# Patient Record
Sex: Female | Born: 1945 | Race: White | Hispanic: No | Marital: Married | Smoking: Former smoker
Health system: Southern US, Community
[De-identification: ages and names within clinical notes are randomized; demographics above are authoritative.]

## PROBLEM LIST (undated history)

## (undated) DIAGNOSIS — R569 Unspecified convulsions: Secondary | ICD-10-CM

## (undated) DIAGNOSIS — I10 Essential (primary) hypertension: Secondary | ICD-10-CM

## (undated) DIAGNOSIS — M8580 Other specified disorders of bone density and structure, unspecified site: Secondary | ICD-10-CM

## (undated) DIAGNOSIS — C801 Malignant (primary) neoplasm, unspecified: Secondary | ICD-10-CM

## (undated) DIAGNOSIS — Z5189 Encounter for other specified aftercare: Secondary | ICD-10-CM

## (undated) HISTORY — DX: Other specified disorders of bone density and structure, unspecified site: M85.80

## (undated) HISTORY — DX: Malignant (primary) neoplasm, unspecified: C80.1

## (undated) HISTORY — PX: CHOLECYSTECTOMY: SHX55

## (undated) HISTORY — PX: VAGINAL HYSTERECTOMY: SHX2639

## (undated) HISTORY — PX: HERNIA REPAIR: SHX51

## (undated) HISTORY — DX: Unspecified convulsions: R56.9

## (undated) HISTORY — PX: MOHS SURGERY: SUR867

## (undated) HISTORY — DX: Essential (primary) hypertension: I10

## (undated) HISTORY — DX: Encounter for other specified aftercare: Z51.89

---

## 2003-04-20 HISTORY — PX: OOPHORECTOMY: SHX86

## 2009-10-16 ENCOUNTER — Ambulatory Visit: Payer: Self-pay | Admitting: Internal Medicine

## 2009-10-16 DIAGNOSIS — J45991 Cough variant asthma: Secondary | ICD-10-CM

## 2009-10-16 DIAGNOSIS — C4490 Unspecified malignant neoplasm of skin, unspecified: Secondary | ICD-10-CM

## 2009-10-16 DIAGNOSIS — G40309 Generalized idiopathic epilepsy and epileptic syndromes, not intractable, without status epilepticus: Secondary | ICD-10-CM | POA: Insufficient documentation

## 2009-10-16 DIAGNOSIS — R05 Cough: Secondary | ICD-10-CM

## 2009-11-10 ENCOUNTER — Ambulatory Visit: Payer: Self-pay | Admitting: Internal Medicine

## 2009-11-14 ENCOUNTER — Telehealth (INDEPENDENT_AMBULATORY_CARE_PROVIDER_SITE_OTHER): Payer: Self-pay | Admitting: *Deleted

## 2009-12-03 ENCOUNTER — Telehealth (INDEPENDENT_AMBULATORY_CARE_PROVIDER_SITE_OTHER): Payer: Self-pay | Admitting: *Deleted

## 2009-12-24 ENCOUNTER — Ambulatory Visit: Payer: Self-pay | Admitting: Internal Medicine

## 2010-01-20 ENCOUNTER — Telehealth: Payer: Self-pay | Admitting: Internal Medicine

## 2010-01-27 ENCOUNTER — Telehealth (INDEPENDENT_AMBULATORY_CARE_PROVIDER_SITE_OTHER): Payer: Self-pay | Admitting: *Deleted

## 2010-03-26 ENCOUNTER — Ambulatory Visit: Payer: Self-pay | Admitting: Internal Medicine

## 2010-03-26 DIAGNOSIS — M810 Age-related osteoporosis without current pathological fracture: Secondary | ICD-10-CM | POA: Insufficient documentation

## 2010-05-19 NOTE — Progress Notes (Signed)
Summary: neb meds not at pharmacy  Phone Note Call from Patient   Caller: Patient Call For: wert Summary of Call: pharmacy have not received prescript for her med :albuterol inhaler  [facility] [phone] Initial call taken by: Rickard Patience,  January 27, 2010 2:11 PM  Follow-up for Phone Call        per EMR, we've already sent this electronically on 01/20/2010.  Called and spoke with [employer] [employer] and informed them of the above info.  Was told that they were having computer problems last week and did not show that they received the rx for Albuterol/Ipratrop and Budesonide that we sent on 01/20/2010.  Therefore gave a verbal order for these rx.  Called and spoke with pt. and informed her of the above information and that rx were called into pharmacy.  nothing further needed.  Arman Filter LPN  January 27, 2010 4:37 PM

## 2010-05-19 NOTE — Progress Notes (Signed)
Summary: duoneb and budesonide refills  Phone Note Call from Patient   Caller: Patient Call For: Allison Gross Summary of Call: need refills for duo neb solution and budesonide [facility] [place] [phone] Initial call taken by: Rickard Patience,  January 20, 2010 2:12 PM  Follow-up for Phone Call        called spoke with patient who requests a refill on her duoneb and budesonide nebs; this has been filled previously by another physician.  Dr. Sherene Sires, may we begin filling this for pt?  *note: the pharmacy in pt's chart has been bought out by [facility].  Follow-up by: Boone Master CNA/MA,  January 20, 2010 2:47 PM  Additional Follow-up for Phone Call Additional follow up Details #1::        ok to refill Additional Follow-up by: Nyoka Cowden MD,  January 20, 2010 6:07 PM    Additional Follow-up for Phone Call Additional follow up Details #2::    refill sent. pt aware. Carron Curie CMA  January 21, 2010 9:04 AM   Prescriptions: IPRATROPIUM-ALBUTEROL 0.5-2.5 (3) MG/3ML SOLN (IPRATROPIUM-ALBUTEROL) 1 every 6 hours in nebulizer if needed  #120 x 3   Entered by:   Carron Curie CMA   Authorized by:   Nyoka Cowden MD   Signed by:   Carron Curie CMA on 01/21/2010   Method used:   Electronically to        [facility] (retail)       [address]       [place], [place]  [address]       Ph: [phone]       Fax:    RxID:   [id number] BUDESONIDE 0.5 MG/2ML SUSP (BUDESONIDE) 1 in nebulizer two times a day as needed  #60 x 3   Entered by:   Carron Curie CMA   Authorized by:   Nyoka Cowden MD   Signed by:   Carron Curie CMA on 01/21/2010   Method used:   Electronically to        [facility]* (retail)       [address]       [place], [place]  [address]       Ph: [phone]       Fax:    RxID:   [id number]

## 2010-05-19 NOTE — Assessment & Plan Note (Signed)
Summary: Pulmonary/ ext f/u ov   Copy to:  self Primary Provider/Referring Provider:  none  CC:  4 wk followup.  Pt states that her breathing is the same- has not had any cough since last seen except for 1 day when the weather was humid.  She states had a sore throat a few days after starting symbicort but this has resolved.  Allison Gross  History of Present Illness: 65 yowf quit light smoking 1973 with intermittent sinus/ bronchitis but no  chronic c/o's or need for rx not much better after stopped smoking  then much worse after around 2000 despite daily maint rx.  Previously found to be allergic to dust, cats and trees and mold but no seasonal pattern.  October 16, 2009  1st pulmonary office eval on a typical good day used advair , singulair nasonex and allegra, astepro back up plan to add duoneb and budesonide but reasons she's here =  doesn't seem to help much. last exac May and still required prednisone x sev weeks.  Still left with sensation of pnds but no noct cough or excess mucus production.  November 10, 2009 cc her breathing is the same- has not had any cough since last seen except for 1 day when the weather was humid.  She states had a sore throat a few days after starting symbicort but this has resolved.  overall happy with rx.  Pt denies any significant sore throat, dysphagia, itching, sneezing,  nasal congestion or excess secretions,  fever, chills, sweats, unintended wt loss, pleuritic or exertional cp, hempoptysis, change in activity tolerance  orthopnea pnd or leg swelling. still sense of daytime need to clear throat worse with voice use, no noct spells or need for rescue  Current Medications (verified): 1)  Tegretol Xr 200 Mg Xr12h-Tab (Carbamazepine) .Allison Gross.. 1 Two Times A Day 2)  Singulair 10 Mg Tabs (Montelukast Sodium) .Allison Gross.. 1 Once Daily 3)  Nasonex 50 Mcg/act Susp (Mometasone Furoate) .... 2 Sprays Each Nostril Once Daily 4)  Sinus Rinse  Pack (Hypertonic Nasal Wash) .Allison Gross.. 1 Once Daily 5)   Allegra 180 Mg Tabs (Fexofenadine Hcl) .Allison Gross.. 1 Once Daily 6)  Astepro 0.15 % Soln (Azelastine Hcl) .... 2 Sprays Each Nostril Daily 7)  Calcium 1200 Mg Plus Vit D .... 1 Once Daily 8)  Vitamin B-12 1000 Mcg Tabs (Cyanocobalamin) .Allison Gross.. 1 Once Daily 9)  Vitamin C 500 Mg Tabs (Ascorbic Acid) .Allison Gross.. 1 Once Daily 10)  Multivitamins  Tabs (Multiple Vitamin) .Allison Gross.. 1 Once Daily 11)  Symbicort 160-4.5 Mcg/act  Aero (Budesonide-Formoterol Fumarate) .... 2 Puffs First Thing  in Am and 2 Puffs Again in Pm About 12 Hours Later 12)  Ipratropium-Albuterol 0.5-2.5 (3) Mg/36ml Soln (Ipratropium-Albuterol) .Allison Gross.. 1 Every 6 Hours in Nebulizer If Needed 13)  Budesonide 0.5 Mg/66ml Susp (Budesonide) .Allison Gross.. 1 in Nebulizer Two Times A Day As Needed 14)  Mucinex Maximum Strength 1200 Mg Xr12h-Tab (Guaifenesin) .... 2 Two Times A Day As Needed 15)  Ipratropium Bromide 0.03 % Soln (Ipratropium Bromide) .Allison Gross.. 1 Spray Each Nostril As Needed 16)  Proair Hfa 108 (90 Base) Mcg/act Aers (Albuterol Sulfate) .... 2 Puffs Every 4-6 Hours As Needed  Allergies (verified): No Known Drug Allergies  Past History:  Past Medical History: Asthma     - HFA 50% November 10, 2009 Osteopenia     - hold fosfamax 09/2009  Vital Signs:  Patient profile:   65 year old female Weight:      153 pounds O2 Sat:  99 % on Room air Temp:     98.4 degrees F oral Pulse rate:   69 / minute BP sitting:   140 / 96  (left arm)  Vitals Entered By: Vernie Murders (November 10, 2009 11:13 AM)  O2 Flow:  Room air  Physical Exam  Additional Exam:  obese slt hoarse wf occ throat clearing   wt 153 > 153 November 10, 2009  HEENT: nl dentition, turbinates, and orophanx. Nl external ear canals without cough reflex NECK :  without JVD/Nodes/TM/ nl carotid upstrokes bilaterally LUNGS: no acc muscle use, clear to A and P bilaterally without cough on insp or exp maneuvers CV:  RRR  no s3 or murmur or increase in P2, no edema  ABD:  soft and nontender with nl excursion in  the supine position. No bruits or organomegaly, bowel sounds nl MS:  warm without deformities, calf tenderness, cyanosis or clubbing       Impression & Recommendations:  Problem # 1:  COUGH (ICD-786.2)   Classic Upper airway cough syndrome, so named because it's frequently impossible to sort out how much is  CR/sinusitis with freq throat clearing (which can be related to primary GERD)   vs  causing  secondary extra esophageal GERD from wide swings in gastric pressure that occur with throat clearing, promoting self use of mint and menthol lozenges that reduce the lower esophageal sphincter tone and exacerbate the problem further These are the same pts who not infrequently have failed to tolerate ace inhibitors,  dry powder inhalers or biphosphonates or report having reflux symptoms that don't respond to standard doses of PPI  Try continue off advair, use lower dose symbicort to see if airway less irriated, consider qvar next ov Add 1st gen H1 per guidlelines  Orders: Est. Patient Level IV (10272)  Problem # 2:  ASTHMA (ICD-493.90) All goals of asthma met including optimal function and elimination of symptoms with minimum need for rescue therapy. Contingencies discussed today including the rule of two's.   Medications Added to Medication List This Visit: 1)  Tegretol Xr 200 Mg Xr12h-tab (Carbamazepine) .Allison Gross.. 1 two times a day 2)  Symbicort 80-4.5 Mcg/act Aero (Budesonide-formoterol fumarate) .... 2 puffs first thing  in am and 2 puffs again in pm about 12 hours later 3)  Prilosec Otc 20 Mg Tbec (Omeprazole magnesium) .... Take  one 30-60 min before first meal of the day  Patient Instructions: 1)  change symbicort to 80 2 puffs first thing  in am and 2 puffs again in pm about 12 hours later and work on perfecting your technique 2)  try using chlortrimeton 4 mg  every 6 hours if needed for drainage in place of allegra 3)  add prilosec 20 mg Take  one 30-60 min before first meal of the day 4)   GERD (REFLUX)  is a common cause of respiratory symptoms. It commonly presents without heartburn and can be treated with medication, but also with lifestyle changes including avoidance of late meals, excessive alcohol, smoking cessation, and avoid fatty foods, chocolate, peppermint, colas, red wine, and acidic juices such as orange juice. NO MINT OR MENTHOL PRODUCTS SO NO COUGH DROPS  5)  USE SUGARLESS CANDY INSTEAD (jolley ranchers)  6)  NO OIL BASED VITAMINS  7)  Please schedule a follow-up appointment in 6 weeks, sooner if needed  Prescriptions: SYMBICORT 80-4.5 MCG/ACT AERO (BUDESONIDE-FORMOTEROL FUMARATE) 2 puffs first thing  in am and 2 puffs again in pm about 12 hours later  #  1 x 11   Entered and Authorized by:   Nyoka Cowden MD   Signed by:   Nyoka Cowden MD on 11/10/2009   Method used:   Print then Give to Patient   RxID:   [id number]

## 2010-05-19 NOTE — Progress Notes (Signed)
Summary: change back to allegra from chlor-tabs? > ok but it's prn  Phone Note Call from Patient Call back at [phone]   Caller: Patient Call For: [facility] Summary of Call: IS IT OKAY TO GO BACK TO ALLEGRA . Initial call taken by: Rickard Patience,  November 14, 2009 8:32 AM  Follow-up for Phone Call        [facility] Gweneth Dimitri RN  November 14, 2009 10:00 AM  [facility] Gweneth Dimitri RN  November 14, 2009 4:23 PM   pt returned call, need call back asap as she is going out in an hour. [phone] Follow-up by: Eugene Gavia,  November 17, 2009 8:49 AM  Additional Follow-up for Phone Call Additional follow up Details #1::        per 7.25.11 ov note, pt to try off the allegra and begin chlortrimeton 4mg  q6h for drainage.  called spoke with patient who states that Thursday and Friday she was having a lot of drainage and coughing with clear-to-opaque colored mucus.  pt began her nebulizers this weekend and states that she is better since Thursday but would like to know if MW would like to continue the chlor-tabs or go back to the allegra.  pt states she is aware that these symptoms may be "coincidental".    call back at home number, okay to leave message on machine or w/ family member. Additional Follow-up by: Boone Master CNA/MA,  November 17, 2009 8:59 AM    Additional Follow-up for Phone Call Additional follow up Details #2::    ok as long as understands the allegra is prn drainage and itching/ sneezing, and  cough and wheeze are better and back to her normal amount of nebulizer use, otherwise needs ov this week to regroup Follow-up by: Nyoka Cowden MD,  November 17, 2009 9:38 AM  Additional Follow-up for Phone Call Additional follow up Details #3:: Details for Additional Follow-up Action Taken: [facility] per pt request with the above recs per MW.  Advised if has any questions to call back. Additional Follow-up by: Vernie Murders,  November 17, 2009 2:13 PM

## 2010-05-19 NOTE — Assessment & Plan Note (Signed)
Summary: Pulmonary/ f/u asthma improved on symbicort 80 plus PPI    Copy to:  self Primary Provider/Referring Provider:  none  CC:  6 week folllowup, pt says doing much better, and no cough  a little hoarse no sob.  History of Present Illness: 45 yowf quit light smoking 1973 with intermittent sinus/ bronchitis but no  chronic c/o's or need for rx not much better after stopped smoking  then much worse after around 2000 despite daily maint rx for asthma.  Previously found to be allergic to dust, cats and trees and mold but no seasonal pattern.  October 16, 2009  1st pulmonary office eval on a typical good day used advair , singulair nasonex and allegra, astepro back up plan to add duoneb and budesonide but reasons she's here =  doesn't seem to help much. last exac May and still required prednisone x sev weeks.  Still left with sensation of pnds but no noct cough or excess mucus production. rec try change advair to symbicort 160 and off fosfamax with gerd diet.   November 10, 2009 cc her breathing is the same- has not had any cough since last seen except for 1 day when the weather was humid.  She states had a sore throat a few days after starting symbicort but this has resolved.  overall happy with rx.   change symbicort to 80 2 puffs first thing  in am and 2 puffs again in pm about 12 hours later and work on perfecting your technique try using chlortrimeton 4 mg  every 6 hours if needed for drainage in place of allegra add prilosec 20 mg Take  one 30-60 min before first meal of the day  December 24, 2009 6 week folllowup, pt says doing much better, no cough  a little hoarse no sob. Pt denies any significant sore throat, dysphagia, itching, sneezing,  nasal congestion or excess secretions,  fever, chills, sweats, unintended wt loss, pleuritic or exertional cp, hempoptysis, change in activity tolerance  orthopnea pnd or leg swelling. Pt also denies any obvious fluctuation in symptoms with weather or  environmental change or other alleviating or aggravating factors.  no noct symptoms.     Preventive Screening-Counseling & Management  Alcohol-Tobacco     Smoking Status: quit > 6 months     Year Quit: 1973  Current Medications (verified): 1)  Tegretol Xr 200 Mg Xr12h-Tab (Carbamazepine) .Marland Kitchen.. 1 Two Times A Day 2)  Singulair 10 Mg Tabs (Montelukast Sodium) .Marland Kitchen.. 1 Once Daily 3)  Nasonex 50 Mcg/act Susp (Mometasone Furoate) .... 2 Sprays Each Nostril Once Daily 4)  Sinus Rinse  Pack (Hypertonic Nasal Wash) .Marland Kitchen.. 1 Once Daily 5)  Allegra 180 Mg Tabs (Fexofenadine Hcl) .Marland Kitchen.. 1 Once Daily 6)  Astepro 0.15 % Soln (Azelastine Hcl) .... 2 Sprays Each Nostril Daily 7)  Calcium 1200 Mg Plus Vit D .... 1 Once Daily 8)  Vitamin B-12 1000 Mcg Tabs (Cyanocobalamin) .Marland Kitchen.. 1 Once Daily 9)  Vitamin C 500 Mg Tabs (Ascorbic Acid) .Marland Kitchen.. 1 Once Daily 10)  Multivitamins  Tabs (Multiple Vitamin) .Marland Kitchen.. 1 Once Daily 11)  Symbicort 80-4.5 Mcg/act Aero (Budesonide-Formoterol Fumarate) .... 2 Puffs First Thing  in Am and 2 Puffs Again in Pm About 12 Hours Later 12)  Prilosec Otc 20 Mg Tbec (Omeprazole Magnesium) .... Take  One 30-60 Min Before First Meal of The Day 13)  Proair Hfa 108 (90 Base) Mcg/act Aers (Albuterol Sulfate) .... 2 Puffs Every 4-6 Hours As Needed 14)  Ipratropium-Albuterol 0.5-2.5 (3) Mg/48ml Soln (Ipratropium-Albuterol) .Marland Kitchen.. 1 Every 6 Hours in Nebulizer If Needed 15)  Budesonide 0.5 Mg/42ml Susp (Budesonide) .Marland Kitchen.. 1 in Nebulizer Two Times A Day As Needed 16)  Mucinex Maximum Strength 1200 Mg Xr12h-Tab (Guaifenesin) .... 2 Two Times A Day As Needed 17)  Ipratropium Bromide 0.03 % Soln (Ipratropium Bromide) .Marland Kitchen.. 1 Spray Each Nostril As Needed  Allergies (verified): No Known Drug Allergies  Past History:  Past Medical History: Asthma     - HFA 50% November 10, 2009 >  75% December 24, 2009  Osteopenia     - hold fosfamax 09/2009  Social History: Smoking Status:  quit > 6 months  Vital  Signs:  Patient profile:   65 year old female Height:      62 inches Weight:      155.38 pounds BMI:     28.52 O2 Sat:      97 % on Room air Temp:     97.7 degrees F oral Pulse rate:   69 / minute BP sitting:   100 / 80  (left arm) Cuff size:   regular  Vitals Entered By: Kandice Hams CMA (December 24, 2009 10:23 AM)  O2 Flow:  Room air CC: 6 week folllowup, pt says doing much better, no cough  a little hoarse no sob   Physical Exam  Additional Exam:  obese slt hoarse wf occ throat clearing   wt  153 November 10, 2009 > 155 December 24, 2009  HEENT: nl dentition, turbinates, and orophanx. Nl external ear canals without cough reflex NECK :  without JVD/Nodes/TM/ nl carotid upstrokes bilaterally LUNGS: no acc muscle use, clear to A and P bilaterally without cough on insp or exp maneuvers CV:  RRR  no s3 or murmur or increase in P2, no edema  ABD:  soft and nontender with nl excursion in the supine position. No bruits or organomegaly, bowel sounds nl MS:  warm without deformities, calf tenderness, cyanosis or clubbing       Impression & Recommendations:  Problem # 1:  ASTHMA (ICD-493.90) All goals of asthma met including optimal function and elimination of symptoms with minimum need for rescue therapy. Contingencies discussed today including the rule of two's.  ok to try to taper off gerd rx but critical she continue optimal hfa  I spent extra time with the patient today explaining optimal mdi  technique.  This improved from  50-75%   Each maintenance medication was reviewed in detail including most importantly the difference between maintenance and as needed and under what circumstances the prns are to be used. See instructions for specific recommendations   Other Orders: Est. Patient Level III (16[address]) HFA Instruction [phone])  Patient Instructions: 1)  Try off prilosec now and singulair after first frost 2)  NB the  ramp to expected improvement (and for that matter,  worsening, if a chronic effective medication is stopped)  can be measured in weeks, not days, a common misconception because this is not Heartburn with no immediate cause and effect relationship so that response to therapy or lack thereof can be very difficult to assess.  for any flare of respiratory symptoms restart prilosec Take  one 30-60 min before first meal of the day  3)  Return to office in 3 months, sooner if needed

## 2010-05-19 NOTE — Assessment & Plan Note (Signed)
Summary: Pulmonary/ new pt eval - try off advair > symbicort/ hold Biphos   Visit Type:  Initial Consult Copy to:  self Primary Provider/Referring Provider:  none  CC:  Asthma-second opinion.  History of Present Illness: 65 yowf quit light smoking 1973 with intermittent sinus/ bronchitis but no  chronic c/o's or need for rx not much better after stopped smoking  then much worse after around 2000 despite daily maint rx.  Previously found to be allergic to dust, cats and trees and mold but no seasonal pattern.  October 16, 2009  1st pulmonary office eval on a typical good day used advair , singulair nasonex and allegra, astepro back up plan to add duoneb and budesonide but reasons she's here =  doesn't seem to help much. last exac May and still required prednisone x sev weeks.  Still left with sensation of pnds but no noct cough or excess mucus production. Pt denies any significant sore throat, dysphagia, itching, sneezing,  nasal congestion or excess secretions,  fever, chills, sweats, unintended wt loss, pleuritic or exertional cp, hempoptysis, change in activity tolerance  orthopnea pnd or leg swelling Pt also denies any obvious fluctuation in symptoms with weather or environmental change or other alleviating or aggravating factors.       Current Medications (verified): 1)  Tegretol Xr 400 Mg Xr12h-Tab (Carbamazepine) .Marland Kitchen.. 1 Once Daily 2)  Singulair 10 Mg Tabs (Montelukast Sodium) .Marland Kitchen.. 1 Once Daily 3)  Nasonex 50 Mcg/act Susp (Mometasone Furoate) .... 2 Sprays Each Nostril Once Daily 4)  Sinus Rinse  Pack (Hypertonic Nasal Wash) .Marland Kitchen.. 1 Once Daily 5)  Advair Hfa (? Strength) .... 2 Puffs Two Times A Day 6)  Allegra 180 Mg Tabs (Fexofenadine Hcl) .Marland Kitchen.. 1 Once Daily 7)  Fosamax 70 Mg Tabs (Alendronate Sodium) .Marland Kitchen.. 1 Per Wk 8)  Proair Hfa 108 (90 Base) Mcg/act Aers (Albuterol Sulfate) .... 2 Puffs Every 4-6 Hours As Needed 9)  Astepro 0.15 % Soln (Azelastine Hcl) .... 2 Sprays Each Nostril  Daily 10)  Calcium 1200 Mg Plus Vit D .... 1 Once Daily 11)  Vitamin B-12 1000 Mcg Tabs (Cyanocobalamin) .Marland Kitchen.. 1 Once Daily 12)  Vitamin C 500 Mg Tabs (Ascorbic Acid) .Marland Kitchen.. 1 Once Daily 13)  Multivitamins  Tabs (Multiple Vitamin) .Marland Kitchen.. 1 Once Daily 14)  Ipratropium-Albuterol 0.5-2.5 (3) Mg/74ml Soln (Ipratropium-Albuterol) .Marland Kitchen.. 1 Every 6 Hours in Nebulizer If Needed 15)  Budesonide 0.5 Mg/46ml Susp (Budesonide) .Marland Kitchen.. 1 in Nebulizer Two Times A Day As Needed 16)  Mucinex Maximum Strength 1200 Mg Xr12h-Tab (Guaifenesin) .... 2 Two Times A Day As Needed 17)  Ipratropium Bromide 0.03 % Soln (Ipratropium Bromide) .Marland Kitchen.. 1 Spray Each Nostril As Needed  Allergies (verified): No Known Drug Allergies  Past History:  Past Medical History: Asthma     - HFA 75%   Past Surgical History: Hernia repair 2004 Hysterectomy 1989 Cholecystectomy 2002  Family History: Asthma- MGM Heart dz- "all aunts and uncles on both sides"  Social History: Married  Children Former smoker.  Quit in 1973.  Smoked "socially" x 5-6 yrs Rare ETOH  Review of Systems  The patient denies shortness of breath with activity, shortness of breath at rest, productive cough, non-productive cough, coughing up blood, chest pain, irregular heartbeats, acid heartburn, indigestion, loss of appetite, weight change, abdominal pain, difficulty swallowing, sore throat, tooth/dental problems, headaches, nasal congestion/difficulty breathing through nose, sneezing, itching, ear ache, anxiety, depression, hand/feet swelling, joint stiffness or pain, rash, change in color of mucus, and fever.  Vital Signs:  Patient profile:   65 year old female Height:      62 inches Weight:      153 pounds BMI:     28.09 O2 Sat:      96 % on Room air Temp:     98.6 degrees F oral Pulse rate:   83 / minute BP sitting:   134 / 86  (right arm)  Vitals Entered By: Vernie Murders (October 16, 2009 11:22 AM)  O2 Flow:  Room air  Physical Exam  Additional  Exam:  obese slt hoarse wf freq throat clearing nad. HEENT: nl dentition, turbinates, and orophanx. Nl external ear canals without cough reflex NECK :  without JVD/Nodes/TM/ nl carotid upstrokes bilaterally LUNGS: no acc muscle use, clear to A and P bilaterally without cough on insp or exp maneuvers CV:  RRR  no s3 or murmur or increase in P2, no edema  ABD:  soft and nontender with nl excursion in the supine position. No bruits or organomegaly, bowel sounds nl MS:  warm without deformities, calf tenderness, cyanosis or clubbing SKIN: warm and dry without lesions   NEURO:  alert, approp, no deficits     Impression & Recommendations:  Problem # 1:  ASTHMA (ICD-493.90)   DDX of  difficult airways managment all start with A and  include Adherence, Ace Inhibitors, Acid Reflux, Active Sinus Disease, Alpha 1 Antitripsin deficiency, Anxiety masquerading as Airways dz,  ABPA,  allergy(esp in young), Aspiration (esp in elderly), Adverse effects of DPI,  Active smokers, plus one B  = Beta blocker use..    Adherence seems good but not able to completely control symptoms (see upper airways cough syndrome) or prevent freq and severe exacerbations to point where needs prednisone I spent extra time with the patient today explaining optimal mdi  technique.  This improved from  50-75% effective so try symbicort  Acid Reflux: try diet  Problem # 2:  COUGH (ICD-786.2)  Most  Upper airway cough syndrome, so named because it's frequently impossible to sort out how much is  CR/sinusitis with freq throat clearing (which can be related to primary GERD)   vs  causing  secondary extra esophageal GERD from wide swings in gastric pressure that occur with throat clearing, promoting self use of mint and menthol lozenges that reduce the lower esophageal sphincter tone and exacerbate the problem further These are the same pts who not infrequently have failed to tolerate ace inhibitors,  dry powder inhalers or  biphosphonates or report having reflux symptoms that don't respond to standard doses of PPI.  therefore try off advair and fosfamax for now   Each maintenance medication was reviewed in detail including most importantly the difference between maintenance prns and under what circumstances the prns are to be used.  In addition, these two groups (for which the patient should keep up with refills) were distinguished from a third group :  meds that are used only short term with the intent to complete a course of therapy and then not refill them.  The med list was then fully reconciled and reorganized to reflect this important distinction.   Medications Added to Medication List This Visit: 1)  Tegretol Xr 400 Mg Xr12h-tab (Carbamazepine) .Marland Kitchen.. 1 once daily 2)  Singulair 10 Mg Tabs (Montelukast sodium) .Marland Kitchen.. 1 once daily 3)  Nasonex 50 Mcg/act Susp (Mometasone furoate) .... 2 sprays each nostril once daily 4)  Sinus Rinse Pack (Hypertonic nasal wash) .Marland Kitchen.. 1 once daily 5)  Advair Hfa (? Strength)  .... 2 puffs two times a day 6)  Allegra 180 Mg Tabs (Fexofenadine hcl) .Marland Kitchen.. 1 once daily 7)  Fosamax 70 Mg Tabs (Alendronate sodium) .Marland Kitchen.. 1 per wk 8)  Astepro 0.15 % Soln (Azelastine hcl) .... 2 sprays each nostril daily 9)  Calcium 1200 Mg Plus Vit D  .... 1 once daily 10)  Vitamin B-12 1000 Mcg Tabs (Cyanocobalamin) .Marland Kitchen.. 1 once daily 11)  Vitamin C 500 Mg Tabs (Ascorbic acid) .Marland Kitchen.. 1 once daily 12)  Multivitamins Tabs (Multiple vitamin) .Marland Kitchen.. 1 once daily 13)  Symbicort 160-4.5 Mcg/act Aero (Budesonide-formoterol fumarate) .... 2 puffs first thing  in am and 2 puffs again in pm about 12 hours later 14)  Ipratropium-albuterol 0.5-2.5 (3) Mg/79ml Soln (Ipratropium-albuterol) .Marland Kitchen.. 1 every 6 hours in nebulizer if needed 15)  Budesonide 0.5 Mg/53ml Susp (Budesonide) .Marland Kitchen.. 1 in nebulizer two times a day as needed 16)  Mucinex Maximum Strength 1200 Mg Xr12h-tab (Guaifenesin) .... 2 two times a day as needed 17)   Ipratropium Bromide 0.03 % Soln (Ipratropium bromide) .Marland Kitchen.. 1 spray each nostril as needed 18)  Proair Hfa 108 (90 Base) Mcg/act Aers (Albuterol sulfate) .... 2 puffs every 4-6 hours as needed  Other Orders: New Patient Level V (16109)  Patient Instructions: 1)  stop fosfamax and advair 2)  Start symbicort 160 2 puffs first thing  in am and 2 puffs again in pm about 12 hours later  3)  GERD (REFLUX)  is a common cause of respiratory symptoms. It commonly presents without heartburn and can be treated with medication, but also with lifestyle changes including avoidance of late meals, excessive alcohol, smoking cessation, and avoid fatty foods, chocolate, peppermint, colas, red wine, and acidic juices such as orange juice. NO MINT OR MENTHOL PRODUCTS SO NO COUGH DROPS  4)  USE SUGARLESS CANDY INSTEAD (jolley ranchers)  5)  NO OIL BASED VITAMINS  6)  Please schedule a follow-up appointment in 4 weeks, sooner if needed

## 2010-05-19 NOTE — Progress Notes (Signed)
Summary: Ok to take Shingles vaccine -  Phone Note Call from Patient   Caller: Patient Call For: wert Summary of Call: pt want to know if she is ok to  take shingles shot seeing she is on symbicort. Initial call taken by: Rickard Patience,  December 03, 2009 9:33 AM  Follow-up for Phone Call        Dr. Sherene Sires, pt would like to know if it's ok to take shingles vaccine being on symbicort.  Gweneth Dimitri RN  December 03, 2009 9:56 AM ok   Follow-up by: Nyoka Cowden MD,  December 03, 2009 1:27 PM  Additional Follow-up for Phone Call Additional follow up Details #1::        LMOM TCB to inform pt that it's okay for shingles vaccine per MW.  also, pt needs to be aware that we do not provide this in the office. Boone Master CNA/MA  December 03, 2009 1:57 PM     Additional Follow-up for Phone Call Additional follow up Details #2::    PAtient is aware okay to get shingles vaccine while on Symbicort.Michel Bickers [facility]  December 03, 2009 2:13 PM

## 2010-05-21 NOTE — Assessment & Plan Note (Signed)
Summary: Pulmonary/ f/u ov with hfa 90%    Copy to:  self Primary Provider/Referring Provider:  none  CC:  Cough- better.  History of Present Illness: 72 yowf quit light smoking 1973 with intermittent sinus/ bronchitis but no  chronic c/o's or need for rx not much better after stopped smoking  then much worse after around 2000 despite daily maint rx for asthma.  Previously found to be allergic to dust, cats and trees and mold but no seasonal pattern.  October 16, 2009  1st pulmonary office eval on a typical good day used advair , singulair nasonex and allegra, astepro back up plan to add duoneb and budesonide but reasons she's here =  doesn't seem to help much. last exac May and still required prednisone x sev weeks.  Still left with sensation of pnds but no noct cough or excess mucus production. rec try change advair to symbicort 160 and off fosfamax with gerd diet.   November 10, 2009 cc her breathing is the same- has not had any cough since last seen except for 1 day when the weather was humid.  She states had a sore throat a few days after starting symbicort but this has resolved.  overall happy with rx.   change symbicort to 80 2 puffs first thing  in am and 2 puffs again in pm about 12 hours later and work on perfecting your technique try using chlortrimeton 4 mg  every 6 hours if needed for drainage in place of allegra add prilosec 20 mg Take  one 30-60 min before first meal of the day  December 24, 2009 6 week folllowup, pt says doing much better, no cough  a little hoarse no sob. rec taper prilosec and then off singulair and had a spell while still on singulair  March 26, 2010 ov cc sob /cough better since june of this year  than the last 2 years - got thru one of her typical flares by increasing saba s ov needed, now back to baseline.  Pt denies any significant sore throat, dysphagia, itching, sneezing,  nasal congestion or excess secretions,  fever, chills, sweats, unintended wt loss,  pleuritic or exertional cp, hempoptysis, change in activity tolerance  orthopnea pnd or leg swelling     Current Medications (verified): 1)  Tegretol Xr 200 Mg Xr12h-Tab (Carbamazepine) .Marland Kitchen.. 1 Two Times A Day 2)  Nasonex 50 Mcg/act Susp (Mometasone Furoate) .... 2 Sprays Each Nostril Once Daily 3)  Sinus Rinse  Pack (Hypertonic Nasal Wash) .Marland Kitchen.. 1 Once Daily 4)  Allegra 180 Mg Tabs (Fexofenadine Hcl) .Marland Kitchen.. 1 Once Daily 5)  Astepro 0.15 % Soln (Azelastine Hcl) .... 2 Sprays Each Nostril Daily 6)  Calcium 1200 Mg Plus Vit D .... 1 Once Daily 7)  Vitamin B-12 500 Mcg Tabs (Cyanocobalamin) .Marland Kitchen.. 1 Once Daily 8)  Vitamin C 500 Mg Tabs (Ascorbic Acid) .Marland Kitchen.. 1 Once Daily 9)  Multivitamins  Tabs (Multiple Vitamin) .Marland Kitchen.. 1 Once Daily 10)  Symbicort 80-4.5 Mcg/act Aero (Budesonide-Formoterol Fumarate) .... 2 Puffs First Thing  in Am and 2 Puffs Again in Pm About 12 Hours Later 11)  Prilosec Otc 20 Mg Tbec (Omeprazole Magnesium) .... Take  One 30-60 Min Before First Meal of The Day 12)  Proair Hfa 108 (90 Base) Mcg/act Aers (Albuterol Sulfate) .... 2 Puffs Every 4-6 Hours As Needed 13)  Ipratropium-Albuterol 0.5-2.5 (3) Mg/32ml Soln (Ipratropium-Albuterol) .Marland Kitchen.. 1 Every 6 Hours in Nebulizer If Needed 14)  Budesonide 0.5 Mg/3ml Susp (  Budesonide) .Marland Kitchen.. 1 in Nebulizer Two Times A Day As Needed 15)  Mucinex Maximum Strength 1200 Mg Xr12h-Tab (Guaifenesin) .... 2 Two Times A Day As Needed 16)  Ipratropium Bromide 0.03 % Soln (Ipratropium Bromide) .Marland Kitchen.. 1 Spray Each Nostril As Needed  Allergies (verified): No Known Drug Allergies  Past History:  Past Medical History: Asthma     - HFA 50% November 10, 2009 >  75% December 24, 2009 >  90% March 26, 2010  Osteopenia     - hold fosfamax 09/2009 >> better with active HB so rec Reclast rx  Vital Signs:  Patient profile:   65 year old female Weight:      156 pounds O2 Sat:      99 % on Room air Temp:     97.8 degrees F oral Pulse rate:   73 / minute BP sitting:    142 / 82  (left arm)  Vitals Entered By: Vernie Murders (March 26, 2010 3:36 PM)  O2 Flow:  Room air  Physical Exam  Additional Exam:  mod obese pleasant amb wf nad  wt  153 November 10, 2009 > 155 December 24, 2009 > 156 March 26, 2010  HEENT: nl dentition, turbinates, and orophanx. NECK :  without JVD/Nodes/TM/ nl carotid upstrokes bilaterally LUNGS: no acc muscle use, clear to A and P bilaterally without cough on insp or exp maneuvers CV:  RRR  no s3 or murmur or increase in P2, no edema  ABD:  soft and nontender with nl excursion in the supine position.  MS:  warm without deformities, calf tenderness, cyanosis or clubbing       Impression & Recommendations:  Problem # 1:  ASTHMA (ICD-493.90) I had an extended  summary discussion with the patient today lasting 15 to 20 minutes of a 25 minute visit on the following issues:   All goals of asthma met including optimal function and elimination of symptoms with minimum need for rescue therapy. Contingencies discussed today including the rule of two's.   I spent extra time with the patient today explaining optimal mdi  technique.  This improved from  75-90% p coaching.  could use the symbicort at 4 puffs dosing rather than use so much saba the next flare - studies were done on this dose in stable pts with no adverse effect but no benefit so that's why the 2bid dose is the standard maint rx.   Problem # 2:  OSTEOPOROSIS (ICD-733.00) Has intermittent overt HB and overall much better since stopped biphosphonates in 09/2009 so stronlgy favor yearly IV Reclast here if a biphosphate is still needed  Medications Added to Medication List This Visit: 1)  Vitamin B-12 500 Mcg Tabs (Cyanocobalamin) .Marland Kitchen.. 1 once daily 2)  Prilosec Otc 20 Mg Tbec (Omeprazole magnesium) .... Take  one 30-60 min before first meal of the day  Patient Instructions: 1)  Ok for flare of asthma to take your symbicort 4 puffs every 12 hours if  needed then as soon as  better go back to 2 every 12 hours 2)  Talk to your othopedic doctor about reclast rather than fosfamax  3)  you will need yearly follow up for refills 4)  If your breathing worsens or you need to use your rescue inhaler more than twice weekly or wake up more than twice a month with any respiratory symptoms or require more than two rescue inhalers per year, we need to see you right away. 5)  Appended Document: Orders Update    Clinical Lists Changes       Appended Document: Orders Update    Clinical Lists Changes  Orders: Added new Service order of Est. Patient Level IV (16109) - Signed

## 2010-06-03 ENCOUNTER — Telehealth (INDEPENDENT_AMBULATORY_CARE_PROVIDER_SITE_OTHER): Payer: Self-pay | Admitting: *Deleted

## 2010-06-10 NOTE — Progress Notes (Signed)
Summary: portable neb  > ok with me  Phone Note Call from Patient Call back at Home Phone [phone]   Caller: Patient Call For: wert Summary of Call: pt states she is doing "very well". has some out-of-town trips planned in the spring. wants to know what dr wert or nurse would rec re: portable nebulizor "in the event that pt should need one". she has one that is 67 yrs old- weighs 5lbs. no rush on this. just wants to know recs.  Initial call taken by: Tivis Ringer, CNA,  June 03, 2010 1:36 PM  Follow-up for Phone Call        Pt is requesting a RX for a new nebulizer machine. She staes the one she ahs is about 67 yrs old and it is very heavy. She wants one that she can carry with her this summer on vacation. Pt wants to pick-up prescription becsue she is ordering machine offline. Please advise if ok to give RX. Carron Curie CMA  June 03, 2010 2:57 PM the record we have doesn't support use of neb but we will try to get one anyway - will forward to [facility] to see what can be done  Follow-up by: Nyoka Cowden MD,  June 03, 2010 3:27 PM  Additional Follow-up for Phone Call Additional follow up Details #1::        order given to [facility] to get pt a portable nebulizer Additional Follow-up by: Oneita Jolly,  June 04, 2010 8:53 AM

## 2010-06-17 ENCOUNTER — Telehealth (INDEPENDENT_AMBULATORY_CARE_PROVIDER_SITE_OTHER): Payer: Self-pay | Admitting: *Deleted

## 2010-06-25 NOTE — Progress Notes (Signed)
Summary: needs portable nebulizor-lmtcbx1  Phone Note Call from Patient Call back at Home Phone [phone]   Caller: Patient Call For: wert Summary of Call: [facility] says [facility] a nebulizor "that was as large as the one pt already has". she needs a smaller one (for travel). [facility] says [facility] [phone][facility] one but she needs a rx for this (couldn't use the "copy" that was given to her from [facility]).  Initial call taken by: Tivis Ringer, CNA,  June 17, 2010 9:01 AM  Follow-up for Phone Call        LMTCBx1. Carron Curie CMA  June 17, 2010 11:21 AM  Spoke with pt.  She states that the neb machine from [facility] is too large and request that we call in rx [facility].  This has been called to pharm per pt request and she is aware.  Follow-up by: Vernie Murders,  June 17, 2010 1:53 PM

## 2010-12-01 ENCOUNTER — Other Ambulatory Visit: Payer: Self-pay | Admitting: Internal Medicine

## 2010-12-01 ENCOUNTER — Telehealth: Payer: Self-pay | Admitting: Internal Medicine

## 2010-12-01 MED ORDER — BUDESONIDE-FORMOTEROL FUMARATE 80-4.5 MCG/ACT IN AERO: 2.0000 | INHALATION_SPRAY | Freq: Two times a day (BID) | RESPIRATORY_TRACT | Status: DC

## 2010-12-01 NOTE — Telephone Encounter (Signed)
Refill sent. Pt aware.Raphael Espe, CMA  

## 2010-12-01 NOTE — Telephone Encounter (Signed)
This is a duplicate message.  rx already sent to pharmacy.  Will sign off on message.

## 2010-12-01 NOTE — Telephone Encounter (Signed)
Received refill request for symbicort 80.  Last seen by MW 03/2010, told to follow up in 1 year.  Refills sent.

## 2011-03-24 ENCOUNTER — Telehealth: Payer: Self-pay | Admitting: Internal Medicine

## 2011-03-24 NOTE — Telephone Encounter (Signed)
Pt returned call.  Pt states that she has had an asthma flare x1 week with a dry hacky cough that gets worse after 8-9pm and is keeping her awake at night.  Pt states that she has been using her symbicort regularly, albuterol and budesonide nebs bid > pt has not yet doubled up on her symbicort as recommended at last ov 12.8.11.  Pt is requesting something to help with her cough at night and states that she has had a "hydrocodone cough syrup" in the past from another physician that worked.  Pt okay with call back when in the office as it is not "an emergency."  [facility] in [place].    Dr Sherene Sires please advise, thanks.    Allergies  Allergen Reactions  . Sulfonamide Derivatives     unknown

## 2011-03-24 NOTE — Telephone Encounter (Signed)
Why does pt need a sleep aid for asthma flare?  Last ov w/ MW 03/2010 and has no upcoming appts.  LMOM TCB x1.

## 2011-03-24 NOTE — Telephone Encounter (Signed)
Needs ov this week with all meds in hand - see Tammy 12/6 or me afternoon 12/7

## 2011-03-25 ENCOUNTER — Ambulatory Visit (INDEPENDENT_AMBULATORY_CARE_PROVIDER_SITE_OTHER): Payer: PRIVATE HEALTH INSURANCE | Admitting: Adult Health

## 2011-03-25 ENCOUNTER — Encounter: Payer: Self-pay | Admitting: Adult Health

## 2011-03-25 VITALS — BP 116/78 | HR 73 | Temp 97.5°F | Ht 61.5 in | Wt 157.4 lb

## 2011-03-25 DIAGNOSIS — J45909 Unspecified asthma, uncomplicated: Secondary | ICD-10-CM

## 2011-03-25 MED ORDER — PREDNISONE 10 MG PO TABS: ORAL_TABLET | ORAL | Status: DC

## 2011-03-25 MED ORDER — ALBUTEROL SULFATE HFA 108 (90 BASE) MCG/ACT IN AERS: 2.0000 | INHALATION_SPRAY | Freq: Four times a day (QID) | RESPIRATORY_TRACT | Status: DC | PRN

## 2011-03-25 MED ORDER — AZITHROMYCIN 250 MG PO TABS: ORAL_TABLET | ORAL | Status: AC

## 2011-03-25 MED ORDER — LEVALBUTEROL HCL 0.63 MG/3ML IN NEBU
0.6300 mg | INHALATION_SOLUTION | Freq: Once | RESPIRATORY_TRACT | Status: AC
  Administered 2011-03-25: 0.63 mg via RESPIRATORY_TRACT

## 2011-03-25 NOTE — Assessment & Plan Note (Addendum)
Flare with associated URI/rhinitis  xopenex neb in office   Plan:  Zpack take as directed.  Mucinex DM Twice daily  As needed  Cough/congestion  Steroid taper over next week.  Add Pepcid 20mg  At bedtime  For 2 weeks  Add Chlortrimeton 4mg  2 tabs At bedtime  For 5 days then As needed  For drainage /tickle in throat  Please contact office for sooner follow up if symptoms do not improve or worsen or seek emergency care  follow up Dr. Sherene Sires  In 3 weeks and As needed

## 2011-03-25 NOTE — Telephone Encounter (Signed)
Spoke with pt and sched ov with TP for this am at 11:15.

## 2011-03-25 NOTE — Progress Notes (Signed)
Addended by: Boone Master E on: 03/25/2011 05:09 PM   Modules accepted: Orders

## 2011-03-25 NOTE — Progress Notes (Signed)
Addended by: Boone Master E on: 03/25/2011 12:20 PM   Modules accepted: Orders

## 2011-03-25 NOTE — Progress Notes (Signed)
Subjective:    Patient ID: Allison Gross, female    DOB: [birth year], 65 y.o.   MRN: [id number]  HPI 55 yowf quit light smoking 1973 with intermittent sinus/ bronchitis but no chronic c/o's or need for rx not much better after stopped smoking then much worse after around 2000 despite daily maint rx for asthma. Previously found to be allergic to dust, cats and trees and mold but no seasonal pattern.   October 16, 2009 1st pulmonary office eval on a typical good day used advair , singulair nasonex and allegra, astepro back up plan to add duoneb and budesonide but reasons she's here = doesn't seem to help much. last exac May and still required prednisone x sev weeks. Still left with sensation of pnds but no noct cough or excess mucus production. rec try change advair to symbicort 160 and off fosfamax with gerd diet.   November 10, 2009 cc her breathing is the same- has not had any cough since last seen except for 1 day when the weather was humid. She states had a sore throat a few days after starting symbicort but this has resolved. overall happy with rx. change symbicort to 80 2 puffs first thing in am and 2 puffs again in pm about 12 hours later and work on perfecting your technique  try using chlortrimeton 4 mg every 6 hours if needed for drainage in place of allegra  add prilosec 20 mg Take one 30-60 min before first meal of the day   December 24, 2009 6 week folllowup, pt says doing much better, no cough a little hoarse no sob. rec taper prilosec and then off singulair and had a spell while still on singulair   March 26, 2010 ov cc sob /cough better since june of this year than the last 2 years - got thru one of her typical flares by increasing saba s ov needed, now back to baseline.   Past History:  Past Medical History:  Asthma  - HFA 50% November 10, 2009 > 75% December 24, 2009 > 90% March 26, 2010  Osteopenia  - hold fosfamax 09/2009 >> better with active HB so rec Reclast rx   03/25/2011 Acute  OV  Complains of 1 week of cough, minimally productive, wheezing and drainage. Doing very well since last ov with no asthma flare until last week. NO ER or hospital visit.  OTC not working  No hemoptysis or discolored mucus or fever.     Review of Systems Constitutional:   No  weight loss, night sweats,  Fevers, chills,  +fatigue, or  lassitude.  HEENT:   No headaches,  Difficulty swallowing,  Tooth/dental problems, or  Sore throat,                No sneezing, itching, ear ache,  +nasal congestion, post nasal drip,   CV:  No chest pain,  Orthopnea, PND, swelling in lower extremities, anasarca, dizziness, palpitations, syncope.   GI  No heartburn, indigestion, abdominal pain, nausea, vomiting, diarrhea, change in bowel habits, loss of appetite, bloody stools.   Resp:    No coughing up of blood.     No chest wall deformity  Skin: no rash or lesions.  GU: no dysuria, change in color of urine, no urgency or frequency.  No flank pain, no hematuria   MS:  No joint pain or swelling.  No decreased range of motion.  No back pain.  Psych:  No change in mood or affect. No depression  or anxiety.  No memory loss.         Objective:   Physical Exam GEN: A/Ox3; pleasant , NAD, well nourished   HEENT:  [place]/AT,  EACs-clear, TMs-wnl, NOSE-clear, THROAT-clear, no lesions, no postnasal drip or exudate noted.   NECK:  Supple w/ fair ROM; no JVD; normal carotid impulses w/o bruits; no thyromegaly or nodules palpated; no lymphadenopathy.  RESP  Coarse BS w/ faint exp wheeze no accessory muscle use, no dullness to percussion  CARD:  RRR, no m/r/g  , no peripheral edema, pulses intact, no cyanosis or clubbing.  GI:   Soft & nt; nml bowel sounds; no organomegaly or masses detected.  Musco: Warm bil, no deformities or joint swelling noted.   Neuro: alert, no focal deficits noted.    Skin: Warm, no lesions or rashes        Assessment & Plan:

## 2011-03-25 NOTE — Patient Instructions (Signed)
Zpack take as directed.  Mucinex DM Twice daily  As needed  Cough/congestion  Steroid taper over next week.  Add Pepcid 20mg  At bedtime  For 2 weeks  Add Chlortrimeton 4mg  2 tabs At bedtime  For 5 days then As needed  For drainage /tickle in throat  Please contact office for sooner follow up if symptoms do not improve or worsen or seek emergency care  follow up Dr. Sherene Sires  In 3 weeks and As needed

## 2011-04-23 ENCOUNTER — Encounter: Payer: Self-pay | Admitting: Internal Medicine

## 2011-04-23 ENCOUNTER — Ambulatory Visit (INDEPENDENT_AMBULATORY_CARE_PROVIDER_SITE_OTHER): Payer: PRIVATE HEALTH INSURANCE | Admitting: Internal Medicine

## 2011-04-23 VITALS — BP 160/90 | HR 73 | Temp 98.2°F | Ht 61.5 in | Wt 156.0 lb

## 2011-04-23 DIAGNOSIS — J45909 Unspecified asthma, uncomplicated: Secondary | ICD-10-CM

## 2011-04-23 DIAGNOSIS — I10 Essential (primary) hypertension: Secondary | ICD-10-CM

## 2011-04-23 MED ORDER — BUDESONIDE-FORMOTEROL FUMARATE 160-4.5 MCG/ACT IN AERO: INHALATION_SPRAY | RESPIRATORY_TRACT | Status: DC

## 2011-04-23 NOTE — Progress Notes (Signed)
Subjective:     Patient ID: Allison Gross, female   DOB: 1945-10-08, 66 y.o.   MRN: [id number]  HPI  33 yowf quit light smoking 1973 with intermittent sinus/ bronchitis but no chronic c/o's or need for rx not much better after stopped smoking then much worse after around 2000 despite daily maint rx for asthma. Previously found to be allergic to dust, cats and trees and mold but no seasonal pattern.   04/23/2011 f/u ov/Amaan Meyer cc 2 flares on symbicort 80 2bid x one year  but sob much imporoved since last visit with NP, no cough. No daytime saba. Sleeping ok without nocturnal  or early am exacerbation  of respiratory  c/o's or need for noct saba. Also denies any obvious fluctuation of symptoms with weather or environmental changes or other aggravating or alleviating factors except as outlined above   ROS  At present neg for  any significant sore throat, dysphagia, itching, sneezing,  nasal congestion or excess/ purulent secretions,  fever, chills, sweats, unintended wt loss, pleuritic or exertional cp, hempoptysis, orthopnea pnd or leg swelling.  Also denies presyncope, palpitations, heartburn, abdominal pain, nausea, vomiting, diarrhea  or change in bowel or urinary habits, dysuria,hematuria,  rash, arthralgias, visual complaints, headache, numbness weakness or ataxia.       Past Medical History:  Asthma  - HFA 50% November 10, 2009 > 75% December 24, 2009 > 90% March 26, 2010  Osteopenia  - hold fosfamax 09/2009 >> better with active HB so rec Reclast rx     Review of Systems     Objective:   Physical Exam     Assessment:         Plan:

## 2011-04-23 NOTE — Patient Instructions (Addendum)
Increase the symbicort 160 2bid   Avoid salt and monitor your blood pressure in a week  Ok in one month to stop prilosec but in even of cough restart prilosec 20 mg Take 30-60 min before first meal of the day    If you are satisfied with your treatment plan let your doctor know and he/she can either refill your medications or you can return here when your prescription runs out.     If in any way you are not 100% satisfied,  please tell us.  If 100% better, tell your friends!

## 2011-04-24 DIAGNOSIS — I10 Essential (primary) hypertension: Secondary | ICD-10-CM | POA: Insufficient documentation

## 2011-04-24 NOTE — Assessment & Plan Note (Signed)
Reviewed options, she wants to try salt avoidance and f/u with her primary doctor, self monitoring recommended

## 2011-04-24 NOTE — Assessment & Plan Note (Signed)
Two flares on symbicort 80 2 bid so increase to 160 2 bid per guidelines discussed  The proper method of use, as well as anticipated side effects, of this metered-dose inhaler are discussed and demonstrated to the patient. Improved to 90% with coaching  See instructions for specific recommendations which were reviewed directly with the patient who was given a copy with highlighter outlining the key components.

## 2011-08-08 DIAGNOSIS — Z85828 Personal history of other malignant neoplasm of skin: Secondary | ICD-10-CM | POA: Insufficient documentation

## 2012-03-02 ENCOUNTER — Encounter: Payer: Self-pay | Admitting: Adult Health

## 2012-03-02 ENCOUNTER — Telehealth: Payer: Self-pay | Admitting: Internal Medicine

## 2012-03-02 ENCOUNTER — Ambulatory Visit (INDEPENDENT_AMBULATORY_CARE_PROVIDER_SITE_OTHER): Payer: PRIVATE HEALTH INSURANCE | Admitting: Adult Health

## 2012-03-02 VITALS — BP 106/64 | HR 78 | Temp 98.6°F | Ht 62.0 in | Wt 157.8 lb

## 2012-03-02 DIAGNOSIS — J45909 Unspecified asthma, uncomplicated: Secondary | ICD-10-CM

## 2012-03-02 MED ORDER — PREDNISONE 10 MG PO TABS: ORAL_TABLET | ORAL | Status: DC

## 2012-03-02 MED ORDER — LEVALBUTEROL HCL 0.63 MG/3ML IN NEBU
0.6300 mg | INHALATION_SOLUTION | Freq: Once | RESPIRATORY_TRACT | Status: AC
  Administered 2012-03-02: 0.63 mg via RESPIRATORY_TRACT

## 2012-03-02 MED ORDER — IPRATROPIUM-ALBUTEROL 0.5-2.5 (3) MG/3ML IN SOLN: 3.0000 mL | Freq: Four times a day (QID) | RESPIRATORY_TRACT | Status: DC | PRN

## 2012-03-02 MED ORDER — HYDROCODONE-HOMATROPINE 5-1.5 MG/5ML PO SYRP: 5.0000 mL | ORAL_SOLUTION | Freq: Four times a day (QID) | ORAL | Status: AC | PRN

## 2012-03-02 NOTE — Addendum Note (Signed)
Addended by: Boone Master E on: 03/02/2012 05:45 PM   Modules accepted: Orders

## 2012-03-02 NOTE — Assessment & Plan Note (Addendum)
Exacerbation secondary to smoke exposure xopenex neb  In office   Plan Prednisone taper. Over the next week. Mucinex DM twice daily as needed. For cough and congestion.  Hydromet 1-2 teaspoons every 4-6 hours as needed. For cough, may make you sleepy Please contact office for sooner follow up if symptoms do not improve or worsen or seek emergency care

## 2012-03-02 NOTE — Telephone Encounter (Signed)
Spoke with pt She is c/o problems with asthma recently Last ov here in Jan 2013 Advised will need ov OV with TP for this afternoon at 4:15 pm

## 2012-03-02 NOTE — Progress Notes (Signed)
Subjective:     Patient ID: Allison Gross, female   DOB: 1946/03/17, 66 y.o.   MRN: [id number]  HPI 18 yowf quit light smoking 1973 with intermittent sinus/ bronchitis but no chronic c/o's or need for rx not much better after stopped smoking then much worse after around 2000 despite daily maint rx for asthma. Previously found to be allergic to dust, cats and trees and mold but no seasonal pattern.   04/23/2011 f/u ov/Wert cc 2 flares on symbicort 80 2bid x one year  but sob much imporoved since last visit with NP, no cough. No daytime saba. Sleeping ok without nocturnal  or early am exacerbation  of respiratory  c/o's or need for noct saba. Also denies any obvious fluctuation of symptoms with weather or environmental changes or other aggravating or alleviating factors except as outlined above   03/02/2012 Acute OV  Complains of hoarseness, dry cough, increased SOB, tightness in chest, fatigue x4days - denies f/c/s, head congestion.  was exposed to wood spoke at daughter's .  Started coughing and wheezing.  No fever or discolored mucus.  Increased SABA use.      Past Medical History:  Asthma  - HFA 50% November 10, 2009 > 75% December 24, 2009 > 90% March 26, 2010  Osteopenia  - hold fosfamax 09/2009 >> better with active HB so rec Reclast rx     Review of Systems Constitutional:   No  weight loss, night sweats,  Fevers, chills, fatigue, or  lassitude.  HEENT:   No headaches,  Difficulty swallowing,  Tooth/dental problems, or  Sore throat,                No sneezing, itching, ear ache,  +nasal congestion, post nasal drip,   CV:  No chest pain,  Orthopnea, PND, swelling in lower extremities, anasarca, dizziness, palpitations, syncope.   GI  No heartburn, indigestion, abdominal pain, nausea, vomiting, diarrhea, change in bowel habits, loss of appetite, bloody stools.   Resp:  ,  No coughing up of blood.  No change in color of mucus.    No chest wall deformity  Skin: no rash or  lesions.  GU: no dysuria, change in color of urine, no urgency or frequency.  No flank pain, no hematuria   MS:  No joint pain or swelling.  No decreased range of motion.  No back pain.  Psych:  No change in mood or affect. No depression or anxiety.  No memory loss.         Objective:   Physical Exam GEN: A/Ox3; pleasant , NAD, elderly   HEENT:  [place]/AT,  EACs-clear, TMs-wnl, NOSE-clear, THROAT-clear, no lesions, no postnasal drip or exudate noted.   NECK:  Supple w/ fair ROM; no JVD; normal carotid impulses w/o bruits; no thyromegaly or nodules palpated; no lymphadenopathy.  RESP  Exp wheezes bilaterally no accessory muscle use, no dullness to percussion  CARD:  RRR, no m/r/g  , no peripheral edema, pulses intact, no cyanosis or clubbing.  GI:   Soft & nt; nml bowel sounds; no organomegaly or masses detected.  Musco: Warm bil, no deformities or joint swelling noted.   Neuro: alert, no focal deficits noted.    Skin: Warm, no lesions or rashes      Assessment:         Plan:

## 2012-03-02 NOTE — Patient Instructions (Signed)
Prednisone taper. Over the next week. Mucinex DM twice daily as needed. For cough and congestion.  Hydromet 1-2 teaspoons every 4-6 hours as needed. For cough, may make you sleepy Please contact office for sooner follow up if symptoms do not improve or worsen or seek emergency care

## 2012-03-06 ENCOUNTER — Ambulatory Visit (INDEPENDENT_AMBULATORY_CARE_PROVIDER_SITE_OTHER): Payer: PRIVATE HEALTH INSURANCE | Admitting: Internal Medicine

## 2012-03-06 ENCOUNTER — Telehealth: Payer: Self-pay | Admitting: Adult Health

## 2012-03-06 ENCOUNTER — Encounter: Payer: Self-pay | Admitting: Internal Medicine

## 2012-03-06 VITALS — BP 140/80 | HR 88 | Temp 98.4°F | Ht 62.0 in | Wt 154.4 lb

## 2012-03-06 DIAGNOSIS — J45901 Unspecified asthma with (acute) exacerbation: Secondary | ICD-10-CM

## 2012-03-06 MED ORDER — LEVOFLOXACIN 500 MG PO TABS: 500.0000 mg | ORAL_TABLET | Freq: Every day | ORAL | Status: DC

## 2012-03-06 MED ORDER — PREDNISONE 10 MG PO TABS: ORAL_TABLET | ORAL | Status: DC

## 2012-03-06 NOTE — Telephone Encounter (Signed)
I spoke with pt and she stated her asthma is worse. She stated even after being on the prednisone. I scheduled pt to come in and see MR today 03/06/12 at 12:00

## 2012-03-06 NOTE — Patient Instructions (Addendum)
Uncler why you are not better. - is possible that the sinus issus are preventing you getting better  take levaquin 500mg once daily  X 7 days Do netti pot saline wash nightly for 7 days atleast + day time as needed We will extend your prednisone taper - so restart Please take Take prednisone 40mg once daily x 3 days, then 30mg once daily x 3 days, then 20mg once daily x 3 days, then prednisone 10mg once daily  x 3 days and stop If not better call, might need breathing test/cxr   

## 2012-03-06 NOTE — Progress Notes (Signed)
Subjective:    Patient ID: Allison Gross, female    DOB: [birth year], 66 y.o.   MRN: [id number]  HPI 37 yowf quit light smoking 1973 with intermittent sinus/ bronchitis but no chronic c/o's or need for rx not much better after stopped smoking then much worse after around 2000 despite daily maint rx for asthma. Previously found to be allergic to dust, cats and trees and mold but no seasonal pattern.   04/23/2011 f/u ov/Wert cc 2 flares on symbicort 80 2bid x one year  but sob much imporoved since last visit with NP, no cough. No daytime saba. Sleeping ok without nocturnal  or early am exacerbation  of respiratory  c/o's or need for noct saba. Also denies any obvious fluctuation of symptoms with weather or environmental changes or other aggravating or alleviating factors except as outlined above   03/02/2012 Acute OV  Complains of hoarseness, dry cough, increased SOB, tightness in chest, fatigue x4days - denies f/c/s, head congestion.  was exposed to wood spoke at daughter's .  Started coughing and wheezing.  No fever or discolored mucus.  Increased SABA use.    Prednisone taper. Over the next week.  Mucinex DM twice daily as needed. For cough and congestion.  Hydromet 1-2 teaspoons every 4-6 hours as needed. For cough, may make you sleepy  Please contact office for sooner follow up if symptoms do not improve or worsen or seek emergency care    OV 03/06/2012 - ACUTE VISIT Seen at pulmonary office 4 days earlier and given prednisone for ae-asthma. Says prednisone usually breaks her symptoms really fast but this time no real improvement. She is frustrated, puzzled and worried. Says constanntly sneezing. Has post nasal drip. Is wheezing and coughing a lot. No  Fever. No colored sinus drainage  Past, Family, Social reviewed: no change since last visit   Review of Systems  Constitutional: Negative for fever and unexpected weight change.  HENT: Positive for rhinorrhea, sneezing and postnasal  drip. Negative for ear pain, nosebleeds, congestion, sore throat, trouble swallowing, dental problem and sinus pressure.   Eyes: Negative for redness and itching.  Respiratory: Positive for cough, chest tightness, shortness of breath and wheezing.   Cardiovascular: Negative for palpitations and leg swelling.  Gastrointestinal: Negative for nausea and vomiting.  Genitourinary: Negative for dysuria.  Musculoskeletal: Negative for joint swelling.  Skin: Negative for rash.  Neurological: Negative for headaches.  Hematological: Does not bruise/bleed easily.  Psychiatric/Behavioral: Negative for dysphoric mood. The patient is not nervous/anxious.    Current outpatient prescriptions:albuterol (PROAIR HFA) 108 (90 BASE) MCG/ACT inhaler, Inhale 2 puffs into the lungs every 6 (six) hours as needed., Disp: 1 Inhaler, Rfl: 5;  budesonide-formoterol (SYMBICORT) 160-4.5 MCG/ACT inhaler, Take 2 puffs first thing in am and then another 2 puffs about 12 hours later.   , Disp: 1 Inhaler, Rfl: 12;  calcium-vitamin D (OSCAL WITH D) 500-200 MG-UNIT per tablet, Take 1 tablet by mouth daily.  , Disp: , Rfl:  carbamazepine (TEGRETOL XR) 200 MG 12 hr tablet, Take 200 mg by mouth 2 (two) times daily.  , Disp: , Rfl: ;  dextromethorphan-guaiFENesin (MUCINEX DM) 30-600 MG per 12 hr tablet, Take 1-2 tablets by mouth every 12 (twelve) hours as needed. , Disp: , Rfl: ;  fexofenadine (ALLEGRA) 180 MG tablet, Take 180 mg by mouth daily.  , Disp: , Rfl:  HYDROcodone-homatropine (HYDROMET) 5-1.5 MG/5ML syrup, Take 5 mLs by mouth every 6 (six) hours as needed for cough., Disp: 240 mL, Rfl:  0;  Hypertonic Nasal Wash (SINUS RINSE NA), as directed.  , Disp: , Rfl: ;  ipratropium-albuterol (DUONEB) 0.5-2.5 (3) MG/3ML SOLN, Take 3 mLs by nebulization every 6 (six) hours as needed., Disp: 360 mL, Rfl: 5 losartan-hydrochlorothiazide (HYZAAR) 50-12.5 MG per tablet, Take 1 tablet by mouth daily., Disp: , Rfl: ;  mometasone (NASONEX) 50 MCG/ACT  nasal spray, Place 2 sprays into the nose daily.  , Disp: , Rfl: ;  Multiple Vitamin (MULTIVITAMIN) tablet, Take 1 tablet by mouth daily.  , Disp: , Rfl: ;  omeprazole (PRILOSEC) 20 MG capsule, Take 20 mg by mouth daily.  , Disp: , Rfl:  predniSONE (DELTASONE) 10 MG tablet, 4 tabs for 2 days, then 3 tabs for 2 days, 2 tabs for 2 days, then 1 tab for 2 days, then stop, Disp: 20 tablet, Rfl: 0;  vitamin B-12 (CYANOCOBALAMIN) 500 MCG tablet, Take 500 mcg by mouth daily.  , Disp: , Rfl: ;  vitamin C (ASCORBIC ACID) 500 MG tablet, Take 500 mg by mouth daily.  , Disp: , Rfl: ;  levofloxacin (LEVAQUIN) 500 MG tablet, Take 1 tablet (500 mg total) by mouth daily., Disp: 7 tablet, Rfl: 0 predniSONE (DELTASONE) 10 MG tablet, Take 4 tablets daily x 3 days, take 3 tabs daily x 3 days, then 2 tabs daily x 3 days, then 1 tab daily x 3 days, then stop., Disp: 30 tablet, Rfl: 0     Objective:   Physical Exam Psych: Mildly anxious + GEN: A/Ox3; pleasant , NAD, elderly   HEENT:  [place]/AT,  EACs-clear, TMs-wnl, NOSE-clear, THROAT-clear, no lesions, SIGNIFICANT POST nASAL DRIP +  NECK:  Supple w/ fair ROM; no JVD; normal carotid impulses w/o bruits; no thyromegaly or nodules palpated; no lymphadenopathy.  RESP  Exp wheezes bilaterally no accessory muscle use, no dullness to percussion  CARD:  RRR, no m/r/g  , no peripheral edema, pulses intact, no cyanosis or clubbing.  GI:   Soft & nt; nml bowel sounds; no organomegaly or masses detected.  Musco: Warm bil, no deformities or joint swelling noted.   Neuro: alert, no focal deficits noted.    Skin: Warm, no lesions or rashes       Assessment & Plan:

## 2012-03-09 DIAGNOSIS — J45901 Unspecified asthma with (acute) exacerbation: Secondary | ICD-10-CM | POA: Insufficient documentation

## 2012-03-09 NOTE — Assessment & Plan Note (Signed)
Uncler why you are not better. - is possible that the sinus issus are preventing you getting better  take levaquin 500mg  once daily  X 7 days Do netti pot saline wash nightly for 7 days atleast + day time as needed We will extend your prednisone taper - so restart Please take Take prednisone 40mg  once daily x 3 days, then 30mg  once daily x 3 days, then 20mg  once daily x 3 days, then prednisone 10mg  once daily  x 3 days and stop If not better call, might need breathing test/cxr

## 2012-04-28 ENCOUNTER — Telehealth: Payer: Self-pay | Admitting: Internal Medicine

## 2012-04-28 MED ORDER — AZITHROMYCIN 250 MG PO TABS: ORAL_TABLET | ORAL | Status: DC

## 2012-04-28 NOTE — Telephone Encounter (Signed)
i spoke with pt. She stated she can't come in today. She made an appt to see TP Tuesday. i have sent RX to the pharmacy

## 2012-04-28 NOTE — Telephone Encounter (Signed)
Called and spoke with pt and she stated that 1 wk ago she started with a cold and thought this would get better.  She stated that now she has a cough that is controlled with nebs and inhalers.  She is having lots of congestion that is green in color and stated that she now has a sinus infection.  Pt is requesting an abx called to the pharmacy.  MW please advise. Thanks   Allergies  Allergen Reactions  . Sulfonamide Derivatives     unknown

## 2012-04-28 NOTE — Telephone Encounter (Signed)
Prefer ov but if declines or no avail ok to rx zpak and f/u one week with [name]

## 2012-05-02 ENCOUNTER — Encounter: Payer: Self-pay | Admitting: Adult Health

## 2012-05-02 ENCOUNTER — Ambulatory Visit (INDEPENDENT_AMBULATORY_CARE_PROVIDER_SITE_OTHER): Payer: PRIVATE HEALTH INSURANCE | Admitting: Adult Health

## 2012-05-02 VITALS — BP 138/64 | HR 76 | Temp 99.1°F | Ht 62.0 in | Wt 158.6 lb

## 2012-05-02 DIAGNOSIS — J019 Acute sinusitis, unspecified: Secondary | ICD-10-CM | POA: Insufficient documentation

## 2012-05-02 NOTE — Patient Instructions (Addendum)
Finish Zpack take as directed.  Saline nasal rinses As needed   Mucinex DM Twice daily  As needed  Cough/congestion  Please contact office for sooner follow up if symptoms do not improve or worsen or seek emergency care  follow up Dr. Sherene Sires  In 3 months and As needed

## 2012-05-02 NOTE — Progress Notes (Signed)
Subjective:     Patient ID: Allison Gross, female   DOB: Jul 28, 1945, 67 y.o.   MRN: [id number]  HPI 21 yowf quit light smoking 1973 with intermittent sinus/ bronchitis but no chronic c/o's or need for rx not much better after stopped smoking then much worse after around 2000 despite daily maint rx for asthma. Previously found to be allergic to dust, cats and trees and mold but no seasonal pattern.   04/23/2011 f/u ov/Wert cc 2 flares on symbicort 80 2bid x one year  but sob much imporoved since last visit with NP, no cough. No daytime saba. Sleeping ok without nocturnal  or early am exacerbation  of respiratory  c/o's or need for noct saba. Also denies any obvious fluctuation of symptoms with weather or environmental changes or other aggravating or alleviating factors except as outlined above   03/02/2012 Acute OV  Complains of hoarseness, dry cough, increased SOB, tightness in chest, fatigue x4days - denies f/c/s, head congestion.  was exposed to wood spoke at daughter's .  Started coughing and wheezing.  No fever or discolored mucus.  Increased SABA use.  >>steroid taper   05/02/2012 Follow up  1 week phone note follow up - will finish zpak today.  reports is "100% better", still having some lingering prod cough with clear mucus and PND.  still taking mucinex Called in 1 week ago w/ sinus congestion and pressure x 1 week. Called in zpack Feeling much better w/ decreased congestion and drainage.  No fever , chest pain or increased SABA uses.      Past Medical History:  Asthma  - HFA 50% November 10, 2009 > 75% December 24, 2009 > 90% March 26, 2010  Osteopenia  - hold fosfamax 09/2009 >> better with active HB so rec Reclast rx     Review of Systems Constitutional:   No  weight loss, night sweats,  Fevers, chills, fatigue, or  lassitude.  HEENT:   No headaches,  Difficulty swallowing,  Tooth/dental problems, or  Sore throat,                No sneezing, itching, ear ache,  +nasal  congestion, post nasal drip,   CV:  No chest pain,  Orthopnea, PND, swelling in lower extremities, anasarca, dizziness, palpitations, syncope.   GI  No heartburn, indigestion, abdominal pain, nausea, vomiting, diarrhea, change in bowel habits, loss of appetite, bloody stools.   Resp:  ,  No coughing up of blood.  No change in color of mucus.    No chest wall deformity  Skin: no rash or lesions.  GU: no dysuria, change in color of urine, no urgency or frequency.  No flank pain, no hematuria   MS:  No joint pain or swelling.  No decreased range of motion.  No back pain.  Psych:  No change in mood or affect. No depression or anxiety.  No memory loss.         Objective:   Physical Exam GEN: A/Ox3; pleasant , NAD, elderly   HEENT:  [place]/AT,  EACs-clear, TMs-wnl, NOSE-clear drainage THROAT-clear, no lesions, no postnasal drip or exudate noted.   NECK:  Supple w/ fair ROM; no JVD; normal carotid impulses w/o bruits; no thyromegaly or nodules palpated; no lymphadenopathy.  RESP  CTA  no accessory muscle use, no dullness to percussion  CARD:  RRR, no m/r/g  , no peripheral edema, pulses intact, no cyanosis or clubbing.  GI:   Soft & nt; nml bowel sounds;  no organomegaly or masses detected.  Musco: Warm bil, no deformities or joint swelling noted.   Neuro: alert, no focal deficits noted.    Skin: Warm, no lesions or rashes      Assessment:         Plan:

## 2012-05-02 NOTE — Assessment & Plan Note (Signed)
Resolving sinusitis   Plan Finish Zpack take as directed.  Saline nasal rinses As needed   Mucinex DM Twice daily  As needed  Cough/congestion  Please contact office for sooner follow up if symptoms do not improve or worsen or seek emergency care  follow up Dr. Sherene Sires  In 3 months and As needed

## 2012-05-15 ENCOUNTER — Telehealth: Payer: Self-pay | Admitting: Internal Medicine

## 2012-05-15 MED ORDER — PREDNISONE 10 MG PO TABS: ORAL_TABLET | ORAL | Status: DC

## 2012-05-15 NOTE — Telephone Encounter (Signed)
Prednisone taper over next wee.  Prednisone 10mg  4 tabs for 2 days, then 3 tabs for 2 days, 2 tabs for 2 days, then 1 tab for 2 days, then stop #20  No refills  Cont w/ prev ov recs  Fluids and rest  Please contact office for sooner follow up if symptoms do not improve or worsen or seek emergency care

## 2012-05-15 NOTE — Telephone Encounter (Signed)
Pt called back re: same. Kathleen W Perdue °

## 2012-05-15 NOTE — Telephone Encounter (Signed)
PT reports that PND is not getting better and is causing a croupy cough.    Occas prod (white). Pt feels worse in past 2 days.  Denies chest tightness or wheezing.  Pt finished Zpak.  Pt taking Nasonex  And Zyrtec daily.  Also using Mucinex and nasal saline daily.  MW out of office .  Please advise

## 2012-05-18 ENCOUNTER — Telehealth: Payer: Self-pay | Admitting: Internal Medicine

## 2012-05-18 ENCOUNTER — Ambulatory Visit (INDEPENDENT_AMBULATORY_CARE_PROVIDER_SITE_OTHER)
Admission: RE | Admit: 2012-05-18 | Discharge: 2012-05-18 | Disposition: A | Discharge status: Home / Self Care | Payer: PRIVATE HEALTH INSURANCE | Source: Ambulatory Visit | Attending: Adult Health | Admitting: Adult Health

## 2012-05-18 ENCOUNTER — Ambulatory Visit (INDEPENDENT_AMBULATORY_CARE_PROVIDER_SITE_OTHER): Payer: PRIVATE HEALTH INSURANCE | Admitting: Adult Health

## 2012-05-18 ENCOUNTER — Encounter: Payer: Self-pay | Admitting: Adult Health

## 2012-05-18 VITALS — BP 114/74 | HR 75 | Temp 98.0°F | Ht 62.0 in | Wt 158.0 lb

## 2012-05-18 DIAGNOSIS — J45901 Unspecified asthma with (acute) exacerbation: Secondary | ICD-10-CM

## 2012-05-18 MED ORDER — LEVALBUTEROL HCL 0.63 MG/3ML IN NEBU
0.6300 mg | INHALATION_SOLUTION | Freq: Once | RESPIRATORY_TRACT | Status: AC
  Administered 2012-05-18: 0.63 mg via RESPIRATORY_TRACT

## 2012-05-18 MED ORDER — AMOXICILLIN-POT CLAVULANATE 875-125 MG PO TABS: 1.0000 | ORAL_TABLET | Freq: Two times a day (BID) | ORAL | Status: AC

## 2012-05-18 MED ORDER — HYDROCODONE-HOMATROPINE 5-1.5 MG/5ML PO SYRP: 5.0000 mL | ORAL_SOLUTION | Freq: Four times a day (QID) | ORAL | Status: AC | PRN

## 2012-05-18 NOTE — Telephone Encounter (Signed)
I spoke with pt. She stated her cough is worse. She started the prednisone and it is not helping and has finished the ZPAK. She is scheduled to come in and see TP today at 12:00 for visit. Nothing further was needed

## 2012-05-18 NOTE — Progress Notes (Signed)
Subjective:     Patient ID: Allison Gross, female   DOB: 1945/08/26, 67 y.o.   MRN: [id number]  HPI 70 yowf quit light smoking 1973 with intermittent sinus/ bronchitis but no chronic c/o's or need for rx not much better after stopped smoking then much worse after around 2000 despite daily maint rx for asthma. Previously found to be allergic to dust, cats and trees and mold but no seasonal pattern.   04/23/2011 f/u ov/Wert cc 2 flares on symbicort 80 2bid x one year  but sob much imporoved since last visit with NP, no cough. No daytime saba. Sleeping ok without nocturnal  or early am exacerbation  of respiratory  c/o's or need for noct saba. Also denies any obvious fluctuation of symptoms with weather or environmental changes or other aggravating or alleviating factors except as outlined above   03/02/2012 Acute OV  Complains of hoarseness, dry cough, increased SOB, tightness in chest, fatigue x4days - denies f/c/s, head congestion.  was exposed to wood spoke at daughter's .  Started coughing and wheezing.  No fever or discolored mucus.  Increased SABA use.  >>steroid taper   05/02/2012 Follow up  1 week phone note follow up - will finish zpak today.  reports is "100% better", still having some lingering prod cough with clear mucus and PND.  still taking mucinex Called in 1 week ago w/ sinus congestion and pressure x 1 week. Called in zpack Feeling much better w/ decreased congestion and drainage.  No fever , chest pain or increased SABA uses.  >>finish zpack   05/18/2012 Acute OV  Complains of symptoms returned with sinus congestion w/ green/bloody mucus, prod cough with white/opaque mucus, wheezing x1 week - was given pred taper on 1.28, took 30mg  this AM.  .  Reports felt much better after seen 2 weeks ago, then 5 days ago , started with sinus drainage and congestion. Denies fever or hemoptysis  CXR today w/ no acute process.  Cough and wheezing.have  worsened over last 2 days.  Mucinex is  not helping with her symptoms.        Past Medical History:  Asthma  - HFA 50% November 10, 2009 > 75% December 24, 2009 > 90% March 26, 2010  Osteopenia  - hold fosfamax 09/2009 >> better with active HB so rec Reclast rx     Review of Systems Constitutional:   No  weight loss, night sweats,  Fevers, chills, fatigue, or  lassitude.  HEENT:   No headaches,  Difficulty swallowing,  Tooth/dental problems, or  Sore throat,                No sneezing, itching, ear ache,  +nasal congestion, post nasal drip,   CV:  No chest pain,  Orthopnea, PND, swelling in lower extremities, anasarca, dizziness, palpitations, syncope.   GI  No heartburn, indigestion, abdominal pain, nausea, vomiting, diarrhea, change in bowel habits, loss of appetite, bloody stools.   Resp:  ,  No coughing up of blood.  No change in color of mucus.    No chest wall deformity  Skin: no rash or lesions.  GU: no dysuria, change in color of urine, no urgency or frequency.  No flank pain, no hematuria   MS:  No joint pain or swelling.  No decreased range of motion.  No back pain.  Psych:  No change in mood or affect. No depression or anxiety.  No memory loss.  Objective:   Physical Exam GEN: A/Ox3; pleasant , NAD, elderly   HEENT:  /AT,  EACs-clear, TMs-wnl, NOSE-clear drainage THROAT-clear, no lesions,  Max sinus tenderness   NECK:  Supple w/ fair ROM; no JVD; normal carotid impulses w/o bruits; no thyromegaly or nodules palpated; no lymphadenopathy.  RESP  Bilateral exp wheezing , no accessory muscle use, no dullness to percussion  CARD:  RRR, no m/r/g  , no peripheral edema, pulses intact, no cyanosis or clubbing.  GI:   Soft & nt; nml bowel sounds; no organomegaly or masses detected.  Musco: Warm bil, no deformities or joint swelling noted.   Neuro: alert, no focal deficits noted.    Skin: Warm, no lesions or rashes      Assessment:         Plan:

## 2012-05-18 NOTE — Patient Instructions (Addendum)
Augmentin 875mg  Twice daily  For 10 days . Finish Prednisone taper. Over the next week. Mucinex DM twice daily as needed. For cough and congestion.  Hydromet 1-2 teaspoons every 4-6 hours as needed. For cough, may make you sleepy Saline nasal rinses As needed   Add Pepcid 20mg  At bedtime  X 2 week  May Add Chlortrimeton 4mg  2 tabs At bedtime  For 5 days then As needed  For drainage /tickle in throat -begin this after 1 week on antibiotics  Please contact office for sooner follow up if symptoms do not improve or worsen or seek emergency care  follow up Dr. Sherene Sires  In 4 weeks and As needed

## 2012-05-18 NOTE — Assessment & Plan Note (Signed)
Recurrent exacerbation w/ associated sinusitis   Plan  Augmentin 875mg  Twice daily  For 10 days . Finish Prednisone taper. Over the next week. Mucinex DM twice daily as needed. For cough and congestion.  Hydromet 1-2 teaspoons every 4-6 hours as needed. For cough, may make you sleepy Saline nasal rinses As needed   Add Pepcid 20mg  At bedtime  X 2 week  May Add Chlortrimeton 4mg  2 tabs At bedtime  For 5 days then As needed  For drainage /tickle in throat -begin this after 1 week on antibiotics  Please contact office for sooner follow up if symptoms do not improve or worsen or seek emergency care

## 2012-05-29 ENCOUNTER — Other Ambulatory Visit: Payer: Self-pay | Admitting: Internal Medicine

## 2012-05-30 ENCOUNTER — Telehealth: Payer: Self-pay | Admitting: Internal Medicine

## 2012-05-30 ENCOUNTER — Encounter: Payer: Self-pay | Admitting: Adult Health

## 2012-05-30 ENCOUNTER — Ambulatory Visit (INDEPENDENT_AMBULATORY_CARE_PROVIDER_SITE_OTHER): Payer: PRIVATE HEALTH INSURANCE | Admitting: Adult Health

## 2012-05-30 VITALS — BP 130/76 | HR 78 | Temp 97.1°F | Ht 62.0 in | Wt 159.6 lb

## 2012-05-30 DIAGNOSIS — J45901 Unspecified asthma with (acute) exacerbation: Secondary | ICD-10-CM

## 2012-05-30 MED ORDER — BUDESONIDE-FORMOTEROL FUMARATE 160-4.5 MCG/ACT IN AERO: 2.0000 | INHALATION_SPRAY | Freq: Two times a day (BID) | RESPIRATORY_TRACT | Status: DC

## 2012-05-30 MED ORDER — BENZONATATE 200 MG PO CAPS: 200.0000 mg | ORAL_CAPSULE | Freq: Three times a day (TID) | ORAL | Status: DC | PRN

## 2012-05-30 MED ORDER — HYDROCODONE-HOMATROPINE 5-1.5 MG/5ML PO SYRP: 5.0000 mL | ORAL_SOLUTION | Freq: Four times a day (QID) | ORAL | Status: DC | PRN

## 2012-05-30 NOTE — Patient Instructions (Addendum)
Delsym 2 tsp Twice daily   For cough  Tessalon Three times a day  For cough  Mucinex Twice daily  As needed  Congestion . Hydromet 1-2 teaspoons every 4-6 hours as needed. For cough, may make you sleepy Saline nasal rinses As needed   Continue on Pepcid 20mg  At bedtime   Continue on Chlortrimeton 4mg  2 tabs At bedtime  Please contact office for sooner follow up if symptoms do not improve or worsen or seek emergency care  follow up Dr. Sherene Sires  In 2 weeks and As needed

## 2012-05-30 NOTE — Progress Notes (Signed)
Subjective:     Patient ID: Allison Gross, female   DOB: Sep 21, 1945, 67 y.o.   MRN: [id number]  HPI 3 yowf quit light smoking 1973 with intermittent sinus/ bronchitis but no chronic c/o's or need for rx not much better after stopped smoking then much worse after around 2000 despite daily maint rx for asthma. Previously found to be allergic to dust, cats and trees and mold but no seasonal pattern.   04/23/2011 f/u ov/Wert cc 2 flares on symbicort 80 2bid x one year  but sob much imporoved since last visit with NP, no cough. No daytime saba. Sleeping ok without nocturnal  or early am exacerbation  of respiratory  c/o's or need for noct saba. Also denies any obvious fluctuation of symptoms with weather or environmental changes or other aggravating or alleviating factors except as outlined above   03/02/2012 Acute OV  Complains of hoarseness, dry cough, increased SOB, tightness in chest, fatigue x4days - denies f/c/s, head congestion.  was exposed to wood spoke at daughter's .  Started coughing and wheezing.  No fever or discolored mucus.  Increased SABA use.  >>steroid taper   05/02/2012 Follow up  1 week phone note follow up - will finish zpak today.  reports is "100% better", still having some lingering prod cough with clear mucus and PND.  still taking mucinex Called in 1 week ago w/ sinus congestion and pressure x 1 week. Called in zpack Feeling much better w/ decreased congestion and drainage.  No fever , chest pain or increased SABA uses.  >>finish zpack   05/18/2012 Acute OV  Complains of symptoms returned with sinus congestion w/ green/bloody mucus, prod cough with white/opaque mucus, wheezing x1 week - was given pred taper on 1.28, took 30mg  this AM.  .  Reports felt much better after seen 2 weeks ago, then 5 days ago , started with sinus drainage and congestion. Denies fever or hemoptysis  CXR today w/ no acute process.  Cough and wheezing.have  worsened over last 2 days.  Mucinex is  not helping with her symptoms. >augmentin and steroid taper   05/30/2012 Acute OV  Pt returns for persistent symptoms of cough.  sinus congestion and cough are some improved, worse at night, clear mucus production.  denies tightness in chest, wheezing, f/c/s, dyspnea Patient was seen approximately 2 weeks ago for an acute asthmatic bronchitic exacerbation. She was treated with Augmentin and a steroid taper. Patient reports his symptoms are some improved with decreased cough and congestion. However, her cough is not totally resolved. She denies any hemoptysis, orthopnea, PND, or leg swelling. Patient chest x-ray last visit showed no acute process. Cough is interfering with her sleep.       Past Medical History:  Asthma  - HFA 50% November 10, 2009 > 75% December 24, 2009 > 90% March 26, 2010  Osteopenia  - hold fosfamax 09/2009 >> better with active HB so rec Reclast rx     Review of Systems Constitutional:   No  weight loss, night sweats,  Fevers, chills, fatigue, or  lassitude.  HEENT:   No headaches,  Difficulty swallowing,  Tooth/dental problems, or  Sore throat,                No sneezing, itching, ear ache,  +nasal congestion, post nasal drip,   CV:  No chest pain,  Orthopnea, PND, swelling in lower extremities, anasarca, dizziness, palpitations, syncope.   GI  No heartburn, indigestion, abdominal pain, nausea, vomiting, diarrhea,  change in bowel habits, loss of appetite, bloody stools.   Resp:  ,  No coughing up of blood.  No change in color of mucus.    No chest wall deformity  Skin: no rash or lesions.  GU: no dysuria, change in color of urine, no urgency or frequency.  No flank pain, no hematuria   MS:  No joint pain or swelling.  No decreased range of motion.  No back pain.  Psych:  No change in mood or affect. No depression or anxiety.  No memory loss.         Objective:   Physical Exam GEN: A/Ox3; pleasant , NAD, elderly   HEENT:  [place]/AT,  EACs-clear,  TMs-wnl, NOSE-clear drainage THROAT-clear, no lesions,  Max sinus tenderness   NECK:  Supple w/ fair ROM; no JVD; normal carotid impulses w/o bruits; no thyromegaly or nodules palpated; no lymphadenopathy.  RESP  CTA , wheezing resolved.  no accessory muscle use, no dullness to percussion  CARD:  RRR, no m/r/g  , no peripheral edema, pulses intact, no cyanosis or clubbing.  GI:   Soft & nt; nml bowel sounds; no organomegaly or masses detected.  Musco: Warm bil, no deformities or joint swelling noted.   Neuro: alert, no focal deficits noted.    Skin: Warm, no lesions or rashes      Assessment:         Plan:

## 2012-05-30 NOTE — Telephone Encounter (Signed)
Called and spoke with pt She states that she is not improving since last visit She continues to have the cough despite pred taper, zithromax, and round of augmentin OV with TP at 4 pm today

## 2012-05-30 NOTE — Assessment & Plan Note (Signed)
Resolving flare   Plan Delsym 2 tsp Twice daily   For cough  Tessalon Three times a day  For cough  Mucinex Twice daily  As needed  Congestion . Hydromet 1-2 teaspoons every 4-6 hours as needed. For cough, may make you sleepy Saline nasal rinses As needed   Continue on Pepcid 20mg  At bedtime   Continue on Chlortrimeton 4mg  2 tabs At bedtime  Please contact office for sooner follow up if symptoms do not improve or worsen or seek emergency care  follow up Dr. Sherene Sires  In 2 weeks and As needed

## 2012-06-16 ENCOUNTER — Encounter: Payer: Self-pay | Admitting: Internal Medicine

## 2012-06-16 ENCOUNTER — Ambulatory Visit (INDEPENDENT_AMBULATORY_CARE_PROVIDER_SITE_OTHER): Payer: PRIVATE HEALTH INSURANCE | Admitting: Internal Medicine

## 2012-06-16 VITALS — BP 120/82 | HR 74 | Temp 97.3°F | Ht 62.0 in | Wt 157.2 lb

## 2012-06-16 DIAGNOSIS — J45909 Unspecified asthma, uncomplicated: Secondary | ICD-10-CM

## 2012-06-16 NOTE — Patient Instructions (Addendum)
Tessilon can be used up to every 4 hours as needed  Continue on Pepcid 20mg  At bedtime   Continue on Chlortrimeton 4mg  2 tabs At bedtime  Stop nasonex  Try to reduce the symbicort to one twice daily   Please schedule a follow up office visit in 4 weeks, call sooner if needed with medications in 2 bags, the automatic ones vs plan B= backup

## 2012-06-16 NOTE — Progress Notes (Signed)
Subjective:     Patient ID: Allison Gross, female   DOB: Oct 29, 1945, 67 y.o.   MRN: [id number]  HPI 82 yowf quit light smoking 1973 with intermittent sinus/ bronchitis but no chronic c/o's or need for rx not much better after stopped smoking then much worse after around 2000 despite daily maint rx for asthma. Previously found to be allergic to dust, cats and trees and mold but no seasonal pattern.   04/23/2011 f/u ov/Hillard Goodwine cc 2 flares on symbicort 80 2bid x one year  but sob much imporoved since last visit with NP, no cough.   03/02/2012 Acute OV  Complains of hoarseness, dry cough, increased SOB, tightness in chest, fatigue x4days - denies f/c/s, head congestion.  was exposed to wood spoke at daughter's .  Started coughing and wheezing.  No fever or discolored mucus.  Increased SABA use.  >>steroid taper   05/02/2012 Follow up  1 week phone note follow up - will finish zpak today.  reports is "100% better", still having some lingering prod cough with clear mucus and PND.  still taking mucinex Called in 1 week ago w/ sinus congestion and pressure x 1 week. Called in zpack Feeling much better w/ decreased congestion and drainage.  No fever , chest pain or increased SABA uses.  >>finish zpack   05/18/2012 Acute OV  Complains of symptoms returned with sinus congestion w/ green/bloody mucus, prod cough with white/opaque mucus, wheezing x1 week - was given pred taper on 1.28, took 30mg  this AM.  .  Reports felt much better after seen 2 weeks ago, then 5 days ago , started with sinus drainage and congestion. Denies fever or hemoptysis  CXR today w/ no acute process.  Cough and wheezing.have  worsened over last 2 days.  Mucinex is not helping with her symptoms. >augmentin and steroid taper> did not help   05/30/2012 Acute OV  Pt returns for persistent symptoms of cough.  sinus congestion and cough are some improved, worse at night, clear mucus production.  denies tightness in chest, wheezing,  f/c/s, dyspnea Patient was seen approximately 2 weeks ago for an acute asthmatic bronchitic exacerbation. She was treated with Augmentin and a steroid taper. Patient reports   symptoms are some improved with decreased cough and congestion. However, her cough is not totally resolved.  rec Delsym 2 tsp Twice daily   For cough  Tessalon Three times a day  For cough  Mucinex Twice daily  As needed  Congestion . Hydromet 1-2 teaspoons every 4-6 hours as needed. For cough, may make you sleepy Saline nasal rinses As needed   Continue on Pepcid 20mg  At bedtime   Continue on Chlortrimeton 4mg  2 tabs At bedtime   06/16/12 f/u ov/ Kees Idrovo cc cough much better at this point but confused with maint vs prns and still using mulitiple prns for the same problem.  No obvious daytime variabilty or assoc sob or cp or chest tightness, subjective wheeze overt sinus or hb symptoms. No unusual exp hx or h/o childhood pna/ asthma or premature birth to her knowledge.   Sleeping ok without nocturnal  or early am exacerbation  of respiratory  c/o's or need for noct saba. Also denies any obvious fluctuation of symptoms with weather or environmental changes or other aggravating or alleviating factors except as outlined above  ROS  The following are not active complaints unless bolded sore throat, dysphagia, dental problems, itching, sneezing,  nasal congestion or excess/ purulent secretions, ear ache,   fever,  chills, sweats, unintended wt loss, pleuritic or exertional cp, hemoptysis,  orthopnea pnd or leg swelling, presyncope, palpitations, heartburn, abdominal pain, anorexia, nausea, vomiting, diarrhea  or change in bowel or urinary habits, change in stools or urine, dysuria,hematuria,  rash, arthralgias, visual complaints, headache, numbness weakness or ataxia or problems with walking or coordination,  change in mood/affect or memory.        Past Medical History:  Asthma  - HFA 50% November 10, 2009 > 75% December 24, 2009 >  90% March 26, 2010  Osteopenia  - hold fosfamax 09/2009 >> better with active HB so rec Reclast rx                Objective:   Physical Exam  Wt Readings from Last 3 Encounters:  06/16/12 157 lb 3.2 oz (71.305 kg)  05/30/12 159 lb 9.6 oz (72.394 kg)  05/18/12 158 lb (71.668 kg)    GEN: A/Ox3; pleasant , NAD, elderly wf freq throat clearing  HEENT:  [place]/AT,  EACs-clear, TMs-wnl, NOSE-clear drainage THROAT-clear, no lesions,  Max sinus tenderness   NECK:  Supple w/ fair ROM; no JVD; normal carotid impulses w/o bruits; no thyromegaly or nodules palpated; no lymphadenopathy.  RESP  CTA ,   no accessory muscle use, no dullness to percussion  CARD:  RRR, no m/r/g  , no peripheral edema, pulses intact, no cyanosis or clubbing.  GI:   Soft & nt; nml bowel sounds; no organomegaly or masses detected.  Musco: Warm bil, no deformities or joint swelling noted.   Neuro: alert, no focal deficits noted.    Skin: Warm, no lesions or rashes      Assessment:         Plan:

## 2012-06-26 ENCOUNTER — Telehealth: Payer: Self-pay | Admitting: Internal Medicine

## 2012-06-26 MED ORDER — MOMETASONE FUROATE 50 MCG/ACT NA SUSP: 2.0000 | Freq: Every day | NASAL | Status: DC

## 2012-06-26 NOTE — Telephone Encounter (Signed)
Called pt's home #, spoke with her husband who reports she is not at home right now.  States I can try to reach her on her cell #. Called pt's cell # - went directly to VM.  Lmomtcb

## 2012-06-26 NOTE — Telephone Encounter (Signed)
Spoke with patient, made her aware of recs as listed below per Dr. Sherene Sires. Nothing further needed at this time.

## 2012-06-26 NOTE — Telephone Encounter (Signed)
Spoke with pt.  She was seen by MW on Feb 28 and which time MW rec she stop nasonex.  Pt states she has stopped this but is still taking allergra qd and chlortrimeton qhs.  C/o sinus HA, a lot of sneezing, PND, runny nose with clear drainage, and very little coughing with small amount of clear mucus.  Reports this is not an "asthmatic cough."  Symptoms started on Friday.  Denies wheezing, chest tightness, or chest pain.  Offered OV but pt would first like to see if something can be done over the phone as she was just seen and does have an OV with MW on March 28.  Dr. Sherene Sires, pls advise.  Thank you.  [facility] in [place], [place]  Allergies verified with pt: Allergies  Allergen Reactions  . Sulfonamide Derivatives     unknown

## 2012-06-26 NOTE — Telephone Encounter (Signed)
Chlortrimeton was for drippy nose, not stuffy nose  Best option is to change chlortrimeton to q6 h prn drip and in meantime restart nasonex but take afrin immediately before using the nasonex z 5 days and stop and if not better need to move the f/u ov to this Friday the 14th

## 2012-06-26 NOTE — Telephone Encounter (Signed)
lmomtcb x1 

## 2012-07-03 ENCOUNTER — Other Ambulatory Visit: Payer: Self-pay | Admitting: Internal Medicine

## 2012-07-03 MED ORDER — ALBUTEROL SULFATE HFA 108 (90 BASE) MCG/ACT IN AERS: 2.0000 | INHALATION_SPRAY | Freq: Four times a day (QID) | RESPIRATORY_TRACT | Status: DC | PRN

## 2012-07-03 NOTE — Telephone Encounter (Signed)
Received faxed refill request from local pharmacy [facility] in [place] For proair [facility] 2 puffs every 6 hours as needed Last ov 06/16/12 w/ MW Refills sent

## 2012-07-14 ENCOUNTER — Ambulatory Visit (INDEPENDENT_AMBULATORY_CARE_PROVIDER_SITE_OTHER): Payer: PRIVATE HEALTH INSURANCE | Admitting: Internal Medicine

## 2012-07-14 ENCOUNTER — Encounter: Payer: Self-pay | Admitting: Internal Medicine

## 2012-07-14 VITALS — BP 132/86 | HR 85 | Temp 98.4°F | Ht 62.0 in | Wt 158.4 lb

## 2012-07-14 DIAGNOSIS — R05 Cough: Secondary | ICD-10-CM

## 2012-07-14 DIAGNOSIS — R059 Cough, unspecified: Secondary | ICD-10-CM

## 2012-07-14 DIAGNOSIS — J45909 Unspecified asthma, uncomplicated: Secondary | ICD-10-CM

## 2012-07-14 MED ORDER — ALBUTEROL SULFATE (2.5 MG/3ML) 0.083% IN NEBU: 2.5000 mg | INHALATION_SOLUTION | Freq: Four times a day (QID) | RESPIRATORY_TRACT | Status: DC | PRN

## 2012-07-14 NOTE — Assessment & Plan Note (Signed)
All goals of chronic asthma control met including optimal function and elimination of symptoms with minimal need for rescue therapy.  Contingencies discussed in full including contacting this office immediately if not controlling the symptoms using the rule of two's.    

## 2012-07-14 NOTE — Patient Instructions (Addendum)
Plan A = automatic = symbicort 160 one twice daily and your nasonex once daily but if symptoms worse go to 2 puffs of each every 12 hours   Only use your albuterol (Plan B= back up = proaire,  Plan C = Nebulizer which are changing to pure albuterol)  as a rescue medication to be used if you can't catch your breath by resting or doing a relaxed purse lip breathing pattern. The less you use it, the better it will work when you need it.   Please schedule a follow up visit in 3 months but call sooner if needed

## 2012-07-14 NOTE — Progress Notes (Signed)
Subjective:     Patient ID: Allison Gross, female   DOB: [birth year], 67 y.o.   MRN: [id number]  HPI 61 yowf quit light smoking 1973 with intermittent sinus/ bronchitis but no chronic c/o's or need for rx not much better after stopped smoking then much worse after around 2000 despite daily maint rx for asthma. Previously found to be allergic to dust, cats and trees and mold but no seasonal pattern.   04/23/2011 f/u ov/Allison Gross cc 2 flares on symbicort 80 2bid x one year  but sob much imporoved since last visit with NP, no cough.   03/02/2012 Acute OV  Complains of hoarseness, dry cough, increased SOB, tightness in chest, fatigue x4days - denies f/c/s, head congestion.  was exposed to wood spoke at daughter's .  Started coughing and wheezing.  No fever or discolored mucus.  Increased SABA use.  >>steroid taper   05/02/2012 Follow up  1 week phone note follow up - will finish zpak today.  reports is "100% better", still having some lingering prod cough with clear mucus and PND.  still taking mucinex Called in 1 week ago w/ sinus congestion and pressure x 1 week. Called in zpack Feeling much better w/ decreased congestion and drainage.  No fever , chest pain or increased SABA uses.  >>finish zpack   05/18/2012 Acute OV  Complains of symptoms returned with sinus congestion w/ green/bloody mucus, prod cough with white/opaque mucus, wheezing x1 week - was given pred taper on 1.28, took 30mg  this AM.  .  Reports felt much better after seen 2 weeks ago, then 5 days ago , started with sinus drainage and congestion. Denies fever or hemoptysis  CXR today w/ no acute process.  Cough and wheezing.have  worsened over last 2 days.  Mucinex is not helping with her symptoms. >augmentin and steroid taper> did not help   05/30/2012 Acute OV  Pt returns for persistent symptoms of cough.  sinus congestion and cough are some improved, worse at night, clear mucus production.  denies tightness in chest, wheezing,  f/c/s, dyspnea Patient was seen approximately 2 weeks ago for an acute asthmatic bronchitic exacerbation. She was treated with Augmentin and a steroid taper. Patient reports   symptoms are some improved with decreased cough and congestion. However, her cough is not totally resolved.  rec Delsym 2 tsp Twice daily   For cough  Tessalon Three times a day  For cough  Mucinex Twice daily  As needed  Congestion . Hydromet 1-2 teaspoons every 4-6 hours as needed. For cough, may make you sleepy Saline nasal rinses As needed   Continue on Pepcid 20mg  At bedtime   Continue on Chlortrimeton 4mg  2 tabs At bedtime   06/16/12 f/u ov/ Allison Gross cc cough much better at this point but confused with maint vs prns and still using mulitiple prns for the same problem.  rec Tessilon can be used up to every 4 hours as needed  Continue on Pepcid 20mg  At bedtime   Continue on Chlortrimeton 4mg  2 tabs At bedtime > plus prn daytime Stop nasonex > worse so started back  Try to reduce the symbicort to one twice daily   07/14/2012 f/u ov/Allison Gross cc  Chief Complaint  Patient presents with  . Follow-up    Cough has completely resolved, doing well today     Satisfied cough has been eliminated s the need for any cough suppression  No obvious daytime variabilty or assoc sob or cp or chest tightness, subjective wheeze  overt sinus or hb symptoms. No unusual exp hx or h/o childhood pna/ asthma or premature birth to her knowledge.   Sleeping ok without nocturnal  or early am exacerbation  of respiratory  c/o's or need for noct saba. Also denies any obvious fluctuation of symptoms with weather or environmental changes or other aggravating or alleviating factors except as outlined above  ROS  The following are not active complaints unless bolded sore throat, dysphagia, dental problems, itching, sneezing,  nasal congestion or excess/ purulent secretions, ear ache,   fever, chills, sweats, unintended wt loss, pleuritic or exertional  cp, hemoptysis,  orthopnea pnd or leg swelling, presyncope, palpitations, heartburn, abdominal pain, anorexia, nausea, vomiting, diarrhea  or change in bowel or urinary habits, change in stools or urine, dysuria,hematuria,  rash, arthralgias, visual complaints, headache, numbness weakness or ataxia or problems with walking or coordination,  change in mood/affect or memory.        Past Medical History:  Asthma  - HFA 50% November 10, 2009 > 75% December 24, 2009 > 90% March 26, 2010  Osteopenia  - hold fosfamax 09/2009 >> better with active HB so rec Reclast rx                Objective:   Physical Exam  07/14/2012   158  Wt Readings from Last 3 Encounters:  06/16/12 157 lb 3.2 oz (71.305 kg)  05/30/12 159 lb 9.6 oz (72.394 kg)  05/18/12 158 lb (71.668 kg)    GEN: A/Ox3; pleasant , NAD, elderly wf no longer throat clearing  HEENT:  [place]/AT,  EACs-clear, TMs-wnl, NOSE-clear drainage THROAT-clear, no lesions,  Max sinus tenderness   NECK:  Supple w/ fair ROM; no JVD; normal carotid impulses w/o bruits; no thyromegaly or nodules palpated; no lymphadenopathy.  RESP  CTA ,   no accessory muscle use, no dullness to percussion  CARD:  RRR, no m/r/g  , no peripheral edema, pulses intact, no cyanosis or clubbing.  GI:   Soft & nt; nml bowel sounds; no organomegaly or masses detected.  Musco: Warm bil, no deformities or joint swelling noted.        Assessment:         Plan:

## 2012-07-16 NOTE — Assessment & Plan Note (Addendum)
Resolved with rx directed at rhinitis and asthma.  .Chronic cough is often simultaneously caused by more than one condition. A single cause has been found from 38 to 82% of the time, multiple causes from 18 to 62%. Multiply caused cough has been the result of three diseases up to 42% of the time.    therefore no change in rx     Each maintenance medication was reviewed in detail including most importantly the difference between maintenance and as needed and under what circumstances the prns are to be used.  Please see instructions for details which were reviewed in writing and the patient given a copy.

## 2012-09-13 ENCOUNTER — Telehealth: Payer: Self-pay | Admitting: Internal Medicine

## 2012-09-13 NOTE — Telephone Encounter (Signed)
Called patient to schd follow up apt. Left message x3. Sent letter 09/13/12 °

## 2012-10-12 ENCOUNTER — Other Ambulatory Visit: Payer: Self-pay | Admitting: Internal Medicine

## 2012-10-16 ENCOUNTER — Encounter: Payer: Self-pay | Admitting: Internal Medicine

## 2012-10-16 ENCOUNTER — Ambulatory Visit (INDEPENDENT_AMBULATORY_CARE_PROVIDER_SITE_OTHER): Payer: PRIVATE HEALTH INSURANCE | Admitting: Internal Medicine

## 2012-10-16 VITALS — BP 130/80 | HR 79 | Temp 98.1°F | Ht 61.0 in | Wt 158.0 lb

## 2012-10-16 DIAGNOSIS — R05 Cough: Secondary | ICD-10-CM

## 2012-10-16 DIAGNOSIS — J310 Chronic rhinitis: Secondary | ICD-10-CM

## 2012-10-16 DIAGNOSIS — J45909 Unspecified asthma, uncomplicated: Secondary | ICD-10-CM

## 2012-10-16 MED ORDER — TRAMADOL HCL 50 MG PO TABS: ORAL_TABLET | ORAL | Status: DC

## 2012-10-16 MED ORDER — PREDNISONE (PAK) 10 MG PO TABS: ORAL_TABLET | ORAL | Status: DC

## 2012-10-16 NOTE — Patient Instructions (Addendum)
mucinex dm up to 1200 mg every 12 hours as needed and supplement with tramadol up 50 mg every 4 hours if coughing  Ok to use proaire up to 2 puffs every 4 hours for breathing or cough before resorting to your nebulizer  Prednisone 10 mg take  4 each am x 2 days,   2 each am x 2 days,  1 each am x 2 days and stop   Once this illness has resolved, ok to try off the allegra to see if your notice any difference in itching, sneezing or runny nose   Please schedule a follow up visit in 3 months but call sooner if needed

## 2012-10-16 NOTE — Progress Notes (Signed)
Subjective:     Patient ID: Allison Gross, female   DOB: Mar 02, 1946 .   MRN: [id number]   Brief patient profile:  67yowf quit light smoking 1973 with intermittent sinus/ bronchitis but no chronic c/o's or need for rx not much better after stopped smoking then much worse after around 2000 despite daily maint rx for asthma. Previously found to be allergic to dust, cats and trees and mold but no seasonal pattern.   04/23/2011 f/u ov/[name] cc 2 flares on symbicort 80 2bid x one year  but sob much imporoved since last visit with NP, no cough.   03/02/2012 Acute OV  Cc hoarseness, dry cough, increased SOB, tightness in chest, fatigue x4days - denies f/c/s, head congestion.  was exposed to wood spoke at daughter's .  Started coughing and wheezing.  No fever or discolored mucus.  Increased SABA use.  >>steroid taper    06/16/12 f/u ov/ [name] cc cough much better at this point but confused with maint vs prns and still using mulitiple prns for the same problem.  rec Tessilon can be used up to every 4 hours as needed  Continue on Pepcid 20mg  At bedtime   Continue on Chlortrimeton 4mg  2 tabs At bedtime > plus prn daytime Stop nasonex > worse so started back  Try to reduce the symbicort to one twice daily   07/14/2012 f/u ov/[name]    Chief Complaint  Patient presents with  . Follow-up    Cough has completely resolved, doing well today  rec Plan A = automatic = symbicort 160 one twice daily and your nasonex once daily but if symptoms worse go to 2 puffs of each every 12 hours Only use your albuterol (Plan B= back up = proaire,  Plan C = Nebulizer which are changing to pure albuterol)  as a rescue medication   10/16/2012  Acute eval/[name] / recurrent cough Chief Complaint  Patient presents with  . Follow-up    c/o sneezing, nasal congestion with yellow mucus, increased cough with clear mucus, and wheezing.  Symptoms started last week.     acute illness x one week, mucus has been clear, sore throat  came on abruptly then resolved, never fever, using saba hfa but not neb yet. Min sob never at rest, main issue is cough control worse at hs and in am    No obvious daytime variabilty or assoc sob or cp or chest tightness, subjective wheeze overt sinus or hb symptoms. No unusual exp hx or h/o childhood pna/ asthma or premature birth to her knowledge.   Sleeping ok without nocturnal  or early am exacerbation  of respiratory  c/o's or need for noct saba. Also denies any obvious fluctuation of symptoms with weather or environmental changes or other aggravating or alleviating factors except as outlined above  Current Medications, Allergies, Past Medical History, Past Surgical History, Family History, and Social History were reviewed in [employer] record.  ROS  The following are not active complaints unless bolded sore throat, dysphagia, dental problems, itching, sneezing,  nasal congestion or excess/ purulent secretions, ear ache,   fever, chills, sweats, unintended wt loss, pleuritic or exertional cp, hemoptysis,  orthopnea pnd or leg swelling, presyncope, palpitations, heartburn, abdominal pain, anorexia, nausea, vomiting, diarrhea  or change in bowel or urinary habits, change in stools or urine, dysuria,hematuria,  rash, arthralgias, visual complaints, headache, numbness weakness or ataxia or problems with walking or coordination,  change in mood/affect or memory.  Past Medical History:  Asthma  - HFA 50% November 10, 2009 > 75% December 24, 2009 > 90% March 26, 2010  Osteopenia  - hold fosfamax 09/2009 >> better with active HB so rec Reclast rx                Objective:   Physical Exam  07/14/2012   158  > 10/16/2012 158  Wt Readings from Last 3 Encounters:  06/16/12 157 lb 3.2 oz (71.305 kg)  05/30/12 159 lb 9.6 oz (72.394 kg)  05/18/12 158 lb (71.668 kg)    GEN: A/Ox3; pleasant , NAD, elderly wf no longer throat clearing  HEENT:  [name]/AT,  EACs-clear,  TMs-wnl, NOSE-clear drainage THROAT-clear, no lesions,  Max sinus tenderness   NECK:  Supple w/ fair ROM; no JVD; normal carotid impulses w/o bruits; no thyromegaly or nodules palpated; no lymphadenopathy.  RESP  CTA ,   no accessory muscle use, no dullness to percussion  CARD:  RRR, no m/r/g  , no peripheral edema, pulses intact, no cyanosis or clubbing.  GI:   Soft & nt; nml bowel sounds; no organomegaly or masses detected.  Musco: Warm bil, no deformities or joint swelling noted.        Assessment:

## 2012-10-18 DIAGNOSIS — J310 Chronic rhinitis: Secondary | ICD-10-CM | POA: Insufficient documentation

## 2012-10-18 NOTE — Assessment & Plan Note (Addendum)
Pt not convinced the meds (allegra taken daily)  are making any difference in  symptoms so it's fine with me to try reducing them and observing for worse symptoms but needs to be more insightful about those specific symptoms and have an action plan for prns in event those symptoms worsen as taper meds that may or may not be helping.  This is the reverse of a therapeutic trial and it a way more difficult for pts to follow than adding meds to list.  "stopping meds to see if it hurts"' rather than "starting meds to see if helps"

## 2012-10-18 NOTE — Assessment & Plan Note (Signed)
All goals of chronic asthma control met including optimal function and elimination of symptoms with minimal need for rescue therapy.  Contingencies discussed in full including contacting this office immediately if not controlling the symptoms using the rule of two's.    

## 2012-10-18 NOTE — Assessment & Plan Note (Signed)
Flare with apparent uri / viral with sore throat that came on abruptly then resolved  Will rx as asthma flare with prednisone and try to eliminate cyclical coughing pattern that she's had in past  I had an extended discussion with the patient today lasting 15 to 20 minutes of a 25 minute visit on the following issues:    Each maintenance medication was reviewed in detail including most importantly the difference between maintenance and as needed and under what circumstances the prns are to be used.  Please see instructions for details which were reviewed in writing and the patient given a copy.

## 2012-12-01 ENCOUNTER — Telehealth: Payer: Self-pay | Admitting: Internal Medicine

## 2012-12-01 DIAGNOSIS — J45909 Unspecified asthma, uncomplicated: Secondary | ICD-10-CM

## 2012-12-01 MED ORDER — AEROCHAMBER MINI CHAMBER DEVI: 2.0000 | Freq: Two times a day (BID) | Status: DC

## 2012-12-01 NOTE — Telephone Encounter (Signed)
Pt is aware of MW recs. Sent spacer to her pharmacy, as she lives in [place]. Nothing further was needed.

## 2012-12-01 NOTE — Telephone Encounter (Signed)
Best option is add spacer until next ov but bring inhalers and spacer with her  Use arm and hammer baking soda based toothpast after use and rinse and gargle p use

## 2012-12-01 NOTE — Telephone Encounter (Signed)
I spoke with pt. She stated the symbicort is causing hoarseness. She is doing 1 puff BID. Pt is rinsing after each use of inhaler. She is taking chlortrimeton at night and pepcid at bedtime. She still taking omeprazole in AM. Please advise MW thanks  Allergies  Allergen Reactions  . Sulfonamide Derivatives     unknown

## 2013-01-03 ENCOUNTER — Encounter: Payer: Self-pay | Admitting: Internal Medicine

## 2013-01-03 ENCOUNTER — Ambulatory Visit (INDEPENDENT_AMBULATORY_CARE_PROVIDER_SITE_OTHER): Payer: PRIVATE HEALTH INSURANCE | Admitting: Internal Medicine

## 2013-01-03 VITALS — BP 140/84 | HR 76 | Temp 98.1°F | Ht 62.0 in | Wt 157.0 lb

## 2013-01-03 DIAGNOSIS — R05 Cough: Secondary | ICD-10-CM

## 2013-01-03 DIAGNOSIS — J45909 Unspecified asthma, uncomplicated: Secondary | ICD-10-CM

## 2013-01-03 DIAGNOSIS — Z23 Encounter for immunization: Secondary | ICD-10-CM

## 2013-01-03 NOTE — Patient Instructions (Addendum)
No change in any medication - ok to take allegra as you feel you need it.    you can return here when your prescription runs out.     If in any way you are not 100% satisfied,  please tell us.  If 100% better, tell your friends!

## 2013-01-03 NOTE — Progress Notes (Signed)
Subjective:     Patient ID: Allison Gross, female   DOB: [birth year] .   MRN: [id number]   Brief patient profile:  67yowf quit light smoking 1973 with intermittent sinus/ bronchitis but no chronic c/o's or need for rx not much better after stopped smoking then much worse after around 2000 despite daily maint rx for asthma. Previously found to be allergic to dust, cats and trees and mold but no seasonal pattern.    History of Present Illness  04/23/2011 f/u ov/Allison Gross cc 2 flares on symbicort 80 2bid x one year  but sob much imporoved since last visit with NP, no cough.   03/02/2012 Acute OV  Cc hoarseness, dry cough, increased SOB, tightness in chest, fatigue x4days - denies f/c/s, head congestion.  was exposed to wood spoke at daughter's .  Started coughing and wheezing.  No fever or discolored mucus.  Increased SABA use.  >>steroid taper    06/16/12 f/u ov/ Allison Gross cc cough much better at this point but confused with maint vs prns and still using mulitiple prns for the same problem.  rec Tessilon can be used up to every 4 hours as needed  Continue on Pepcid 20mg  At bedtime   Continue on Chlortrimeton 4mg  2 tabs At bedtime > plus prn daytime Stop nasonex > worse so started back  Try to reduce the symbicort to one twice daily   07/14/2012 f/u ov/Allison Gross    Chief Complaint  Patient presents with  . Follow-up    Cough has completely resolved, doing well today  rec Plan A = automatic = symbicort 160 one twice daily and your nasonex once daily but if symptoms worse go to 2 puffs of each every 12 hours Only use your albuterol (Plan B= back up = proaire,  Plan C = Nebulizer which are changing to pure albuterol)  as a rescue medication   10/16/2012  Acute eval/Allison Gross / recurrent cough Chief Complaint  Patient presents with  . Follow-up    c/o sneezing, nasal congestion with yellow mucus, increased cough with clear mucus, and wheezing.  Symptoms started last week.     acute illness x one week, mucus  has been clear, sore throat came on abruptly then resolved, never fever, using saba hfa but not neb yet. Min sob never at rest, main issue is cough control worse at hs and in am rec mucinex dm up to 1200 mg every 12 hours as needed and supplement with tramadol up 50 mg every 4 hours if coughing Ok to use proaire up to 2 puffs every 4 hours for breathing or cough before resorting to your nebulizer Prednisone 10 mg take  4 each am x 2 days,   2 each am x 2 days,  1 each am x 2 days and stop  Once this illness has resolved, ok to try off the allegra to see if your notice any difference in itching, sneezing or runny nose  Please schedule a follow up visit in 3 months but call sooner if needed   01/03/2013 f/u ov/Allison Gross re: cough  Chief Complaint  Patient presents with  . Follow-up    Breathing and cough improved since last OV.  Discuss possibly having food lodged in throat   on symbicort 160 2bid and rare hfa and no neb or need for cough meds  Thinks may have choked on [place] food, still sensation of FB in R pyriform sinus area    No obvious daytime variabilty or assoc sob or cp  or chest tightness, subjective wheeze overt sinus or hb symptoms. No unusual exp hx or h/o childhood pna/ asthma or premature birth to her knowledge.   Sleeping ok without nocturnal  or early am exacerbation  of respiratory  c/o's or need for noct saba. Also denies any obvious fluctuation of symptoms with weather or environmental changes or other aggravating or alleviating factors except as outlined above  Current Medications, Allergies, Past Medical History, Past Surgical History, Family History, and Social History were reviewed in [employer] record.  ROS  The following are not active complaints unless bolded sore throat, dysphagia, dental problems, itching, sneezing,  nasal congestion or excess/ purulent secretions, ear ache,   fever, chills, sweats, unintended wt loss, pleuritic or exertional cp,  hemoptysis,  orthopnea pnd or leg swelling, presyncope, palpitations, heartburn, abdominal pain, anorexia, nausea, vomiting, diarrhea  or change in bowel or urinary habits, change in stools or urine, dysuria,hematuria,  rash, arthralgias, visual complaints, headache, numbness weakness or ataxia or problems with walking or coordination,  change in mood/affect or memory.        Past Medical History:  Asthma  - HFA 50% November 10, 2009 > 75% December 24, 2009 > 90% March 26, 2010  Osteopenia  - hold fosfamax 09/2009 >> better with active HB so rec Reclast rx         Objective:   Physical Exam  07/14/2012   158  > 10/16/2012 158 > 01/03/2013 157  Wt Readings from Last 3 Encounters:  06/16/12 157 lb 3.2 oz (71.305 kg)  05/30/12 159 lb 9.6 oz (72.394 kg)  05/18/12 158 lb (71.668 kg)    GEN: A/Ox3; pleasant , NAD, elderly wf no longer throat clearing  HEENT:  [place]/AT,  EACs-clear, TMs-wnl, NOSE-clear drainage THROAT-clear, no lesions,  Max sinus tenderness   NECK:  Supple w/ fair ROM; no JVD; normal carotid impulses w/o bruits; no thyromegaly or nodules palpated; no lymphadenopathy.  RESP  CTA ,   no accessory muscle use, no dullness to percussion  CARD:  RRR, no m/r/g  , no peripheral edema, pulses intact, no cyanosis or clubbing.  GI:   Soft & nt; nml bowel sounds; no organomegaly or masses detected.  Musco: Warm bil, no deformities or joint swelling noted.        Assessment:     Outpatient Encounter Prescriptions as of 01/03/2013  Medication Sig Dispense Refill  . albuterol (PROAIR HFA) 108 (90 BASE) MCG/ACT inhaler Inhale 2 puffs into the lungs every 6 (six) hours as needed.  1 Inhaler  5  . albuterol (PROVENTIL) (2.5 MG/3ML) 0.083% nebulizer solution Take 3 mLs (2.5 mg total) by nebulization every 6 (six) hours as needed for wheezing.  75 mL  12  . benzonatate (TESSALON) 200 MG capsule Take 1 capsule (200 mg total) by mouth 3 (three) times daily as needed for cough.  45 capsule  1   . budesonide-formoterol (SYMBICORT) 160-4.5 MCG/ACT inhaler Inhale 2 puffs into the lungs 2 (two) times daily. Take 2 puffs first thing in am and then another 2 puffs about 12 hours later.  1 Inhaler  5  . calcium-vitamin D (OSCAL WITH D) 500-200 MG-UNIT per tablet Take 1 tablet by mouth daily.        . carbamazepine (TEGRETOL XR) 200 MG 12 hr tablet Take 200 mg by mouth 2 (two) times daily.        . chlorpheniramine (CHLOR-TRIMETON) 4 MG tablet Take 8 mg by mouth at bedtime.      Marland Kitchen  dextromethorphan-guaiFENesin (MUCINEX DM) 30-600 MG per 12 hr tablet Take 1-2 tablets by mouth every 12 (twelve) hours as needed.       . fexofenadine (ALLEGRA) 180 MG tablet Take 180 mg by mouth daily.        . Hypertonic Nasal Wash (SINUS RINSE NA) as directed.        Marland Kitchen losartan-hydrochlorothiazide (HYZAAR) 50-12.5 MG per tablet Take 1 tablet by mouth daily.      . Multiple Vitamin (MULTIVITAMIN) tablet Take 1 tablet by mouth daily.        Marland Kitchen NASONEX 50 MCG/ACT nasal spray USE TWO SPRAYS IN EACH NOSTRIL EVERY DAY.  17 g  11  . omeprazole (PRILOSEC) 20 MG capsule Take 20 mg by mouth daily.        . Potassium Chloride ER 20 MEQ TBCR Take 1 tablet by mouth daily.      . predniSONE (STERAPRED UNI-PAK) 10 MG tablet Prednisone 10 mg take  4 each am x 2 days,   2 each am x 2 days,  1 each am x2days and stop  14 tablet  0  . Spacer/Aero-Holding Chambers (AEROCHAMBER MINI CHAMBER) DEVI Inhale 2 puffs into the lungs 2 (two) times daily. Use as directed with Symbicort  1 Device  0  . traMADol (ULTRAM) 50 MG tablet 1-2 every 4 hours as needed for cough or pain  40 tablet  0  . vitamin B-12 (CYANOCOBALAMIN) 500 MCG tablet Take 500 mcg by mouth daily.        . vitamin C (ASCORBIC ACID) 500 MG tablet Take 500 mg by mouth daily.         No facility-administered encounter medications on file as of 01/03/2013.

## 2013-01-04 NOTE — Assessment & Plan Note (Signed)
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

## 2013-01-04 NOTE — Assessment & Plan Note (Signed)
-   HFA 90% p coaching 04/23/11 - c/o hoarseness > added spacer 12/01/2012  > improved  All goals of chronic asthma control met including optimal function and elimination of symptoms with minimal need for rescue therapy.  Contingencies discussed in full including contacting this office immediately if not controlling the symptoms using the rule of two's.     F/u is prn

## 2013-03-26 ENCOUNTER — Other Ambulatory Visit: Payer: Self-pay | Admitting: Adult Health

## 2013-03-28 ENCOUNTER — Telehealth: Payer: Self-pay | Admitting: Internal Medicine

## 2013-03-28 MED ORDER — BUDESONIDE-FORMOTEROL FUMARATE 160-4.5 MCG/ACT IN AERO: 2.0000 | INHALATION_SPRAY | Freq: Two times a day (BID) | RESPIRATORY_TRACT | Status: DC

## 2013-03-28 NOTE — Telephone Encounter (Signed)
Symbicort 160 rx was sent to [facility].   lmomtcb for pt to inform her

## 2013-03-28 NOTE — Telephone Encounter (Signed)
Patient returning call. Informed patient symbicort rx was sent to pharmacy.  Nothing else needed.

## 2013-06-20 ENCOUNTER — Ambulatory Visit: Payer: PRIVATE HEALTH INSURANCE | Admitting: Internal Medicine

## 2013-06-20 ENCOUNTER — Telehealth: Payer: Self-pay | Admitting: Internal Medicine

## 2013-06-20 NOTE — Telephone Encounter (Signed)
Spoke w/ pt. She reports she has been having a non stop coughing fit. She is scheduled to come in and see MW in AM for acute visit. Nothing further needed

## 2013-06-21 ENCOUNTER — Ambulatory Visit (INDEPENDENT_AMBULATORY_CARE_PROVIDER_SITE_OTHER): Payer: PRIVATE HEALTH INSURANCE | Admitting: Internal Medicine

## 2013-06-21 ENCOUNTER — Encounter: Payer: Self-pay | Admitting: Internal Medicine

## 2013-06-21 VITALS — BP 140/94 | HR 78 | Temp 97.9°F | Ht 61.0 in | Wt 164.0 lb

## 2013-06-21 DIAGNOSIS — R059 Cough, unspecified: Secondary | ICD-10-CM

## 2013-06-21 DIAGNOSIS — R058 Other specified cough: Secondary | ICD-10-CM

## 2013-06-21 DIAGNOSIS — R05 Cough: Secondary | ICD-10-CM

## 2013-06-21 DIAGNOSIS — J45909 Unspecified asthma, uncomplicated: Secondary | ICD-10-CM

## 2013-06-21 MED ORDER — TRAMADOL HCL 50 MG PO TABS: ORAL_TABLET | ORAL | Status: DC

## 2013-06-21 MED ORDER — PREDNISONE 10 MG PO TABS: ORAL_TABLET | ORAL | Status: DC

## 2013-06-21 NOTE — Assessment & Plan Note (Signed)
Despite Adequate control on present rx, reviewed > no change in rx needed  But should probably use saba neb vs hfa so as not to aggravate upper airway cough

## 2013-06-21 NOTE — Progress Notes (Signed)
Subjective:     Patient ID: Allison Gross, female   DOB: [birth year] .   MRN: [id number]   Brief patient profile:  68 yowf quit light smoking 1973 with intermittent sinus/ bronchitis but no chronic c/o's or need for rx not much better after stopped smoking then much worse after around 2000 despite daily maint rx for asthma. Previously found to be allergic to dust, cats and trees and mold but no seasonal pattern.    History of Present Illness  04/23/2011 f/u ov/Allison Gross cc 2 flares on symbicort 80 2bid x one year  but sob much imporoved since last visit with NP, no cough.   03/02/2012 Acute OV  Cc hoarseness, dry cough, increased SOB, tightness in chest, fatigue x4days - denies f/c/s, head congestion.  was exposed to wood spoke at daughter's .  Started coughing and wheezing.  No fever or discolored mucus.  Increased SABA use.  >>steroid taper    06/16/12 f/u ov/ Allison Gross cc cough much better at this point but confused with maint vs prns and still using mulitiple prns for the same problem.  rec Tessilon can be used up to every 4 hours as needed  Continue on Pepcid 20mg  At bedtime   Continue on Chlortrimeton 4mg  2 tabs At bedtime > plus prn daytime Stop nasonex > worse so started back  Try to reduce the symbicort to one twice daily   07/14/2012 f/u ov/Allison Gross    Chief Complaint  Patient presents with  . Follow-up    Cough has completely resolved, doing well today  rec Plan A = automatic = symbicort 160 one twice daily and your nasonex once daily but if symptoms worse go to 2 puffs of each every 12 hours Only use your albuterol (Plan B= back up = proaire,  Plan C = Nebulizer which are changing to pure albuterol)  as a rescue medication   10/16/2012  Acute eval/Allison Gross / recurrent cough Chief Complaint  Patient presents with  . Follow-up    c/o sneezing, nasal congestion with yellow mucus, increased cough with clear mucus, and wheezing.  Symptoms started last week.     acute illness x one week,  mucus has been clear, sore throat came on abruptly then resolved, never fever, using saba hfa but not neb yet. Min sob never at rest, main issue is cough control worse at hs and in am rec mucinex dm up to 1200 mg every 12 hours as needed and supplement with tramadol up 50 mg every 4 hours if coughing Ok to use proaire up to 2 puffs every 4 hours for breathing or cough before resorting to your nebulizer Prednisone 10 mg take  4 each am x 2 days,   2 each am x 2 days,  1 each am x 2 days and stop  Once this illness has resolved, ok to try off the allegra to see if your notice any difference in itching, sneezing or runny nose  Please schedule a follow up visit in 3 months but call sooner if needed   01/03/2013 f/u ov/Allison Gross re: cough  Chief Complaint  Patient presents with  . Follow-up    Breathing and cough improved since last OV.  Discuss possibly having food lodged in throat   on symbicort 160 2bid and rare hfa and no neb or need for cough meds  Thinks may have choked on [place] food, still sensation of FB in R pyriform sinus area rec No change in any medication - ok to take allegra  as you feel you need it.   06/21/2013 f/u ov/Allison Gross re: recurrent cough on maint rx with symbicort 160 2bid  Chief Complaint  Patient presents with  . Acute Visit    C/O: cough with clear mucus and wheezing x1 week  was doing fine, then cough  started w/in 10 min of cheese 10 pm at night saba hfa  not really helping much ? For an hour   No obvious daytime variabilty or assoc   cp or chest tightness, subjective wheeze overt sinus or hb symptoms. No unusual exp hx or h/o childhood pna/ asthma or premature birth to her knowledge.   Sleeping ok without nocturnal  or early am exacerbation  of respiratory  c/o's or need for noct saba. Also denies any obvious fluctuation of symptoms with weather or environmental changes or other aggravating or alleviating factors except as outlined above  Current Medications, Allergies,  Past Medical History, Past Surgical History, Family History, and Social History were reviewed in [employer] record.  ROS  The following are not active complaints unless bolded sore throat, dysphagia, dental problems, itching, sneezing,  nasal congestion or excess/ purulent secretions, ear ache,   fever, chills, sweats, unintended wt loss, pleuritic or exertional cp, hemoptysis,  orthopnea pnd or leg swelling, presyncope, palpitations, heartburn, abdominal pain, anorexia, nausea, vomiting, diarrhea  or change in bowel or urinary habits, change in stools or urine, dysuria,hematuria,  rash, arthralgias, visual complaints, headache, numbness weakness or ataxia or problems with walking or coordination,  change in mood/affect or memory.        Past Medical History:  Asthma  - HFA 50% November 10, 2009 > 75% December 24, 2009 > 90% March 26, 2010  Osteopenia  - hold fosfamax 09/2009 >> better with active HB so rec Reclast rx         Objective:   Physical Exam  07/14/2012   158  > 10/16/2012 158 > 01/03/2013 157  > 06/21/2013 164  Wt Readings from Last 3 Encounters:  06/16/12 157 lb 3.2 oz (71.305 kg)  05/30/12 159 lb 9.6 oz (72.394 kg)  05/18/12 158 lb (71.668 kg)    GEN: A/Ox3; pleasant , NAD, elderly wf no longer throat clearing  HEENT:  [place]/AT,  EACs-clear, TMs-wnl, NOSE-clear drainage THROAT-clear, no lesions,  Max sinus tenderness   NECK:  Supple w/ fair ROM; no JVD; normal carotid impulses w/o bruits; no thyromegaly or nodules palpated; no lymphadenopathy.  RESP  CTA ,   no accessory muscle use, no dullness to percussion  CARD:  RRR, no m/r/g  , no peripheral edema, pulses intact, no cyanosis or clubbing.  GI:   Soft & nt; nml bowel sounds; no organomegaly or masses detected.  Musco: Warm bil, no deformities or joint swelling noted.        Assessment:

## 2013-06-21 NOTE — Patient Instructions (Signed)
Prednisone 10 mg take  4 each am x 2 days,   2 each am x 2 days,  1 each am x 2 days and stop  For breathing use nebulizer up to every 4 hours as needed  For cough  First take delsym two tsp every 12 hours and supplement if needed with  tramadol 50 mg up to 2 every 4 hours to suppress the urge to cough at all or even clear your throat. Swallowing water or using ice chips/non mint and menthol containing candies (such as lifesavers or sugarless jolly ranchers) are also effective.  You should rest your voice and avoid activities that you know make you cough.   Once you have eliminated the cough for 3 straight days try reducing the tramadol first,  then the delsym as tolerated.      GERD (REFLUX)  is an extremely common cause of respiratory symptoms, many times with no significant heartburn at all.    It can be treated with medication, but also with lifestyle changes including avoidance of late meals, excessive alcohol, smoking cessation, and avoid fatty foods, chocolate, peppermint, colas, red wine, and acidic juices such as orange juice.  NO MINT OR MENTHOL PRODUCTS SO NO COUGH DROPS  USE SUGARLESS CANDY INSTEAD (jolley ranchers or Stover's)  NO OIL BASED VITAMINS - use powdered substitutes.  Follow up is as needed

## 2013-06-21 NOTE — Assessment & Plan Note (Signed)
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   These are the same pts (now being labeled as having "irritable larynx syndrome" by some cough centers) who not infrequently have a history of having failed to tolerate ace inhibitors,  dry powder inhalers or biphosphonates or report having atypical reflux symptoms that don't respond to standard doses of PPI , and are easily confused as having aecopd or asthma flares by even experienced allergists/ pulmonologists.   For now eliminate cyclical cough then regroup if not better  See instructions for specific recommendations which were reviewed directly with the patient who was given a copy with highlighter outlining the key components.

## 2013-09-21 ENCOUNTER — Encounter: Payer: Self-pay | Admitting: Adult Health

## 2013-09-21 ENCOUNTER — Ambulatory Visit (INDEPENDENT_AMBULATORY_CARE_PROVIDER_SITE_OTHER)
Admission: RE | Admit: 2013-09-21 | Discharge: 2013-09-21 | Disposition: A | Discharge status: Home / Self Care | Payer: PRIVATE HEALTH INSURANCE | Source: Ambulatory Visit | Attending: Adult Health | Admitting: Adult Health

## 2013-09-21 ENCOUNTER — Ambulatory Visit (INDEPENDENT_AMBULATORY_CARE_PROVIDER_SITE_OTHER): Payer: PRIVATE HEALTH INSURANCE | Admitting: Adult Health

## 2013-09-21 ENCOUNTER — Telehealth: Payer: Self-pay | Admitting: Internal Medicine

## 2013-09-21 VITALS — BP 134/74 | HR 80 | Temp 98.3°F | Ht 62.0 in | Wt 159.8 lb

## 2013-09-21 DIAGNOSIS — J45901 Unspecified asthma with (acute) exacerbation: Secondary | ICD-10-CM

## 2013-09-21 MED ORDER — LEVALBUTEROL HCL 0.63 MG/3ML IN NEBU
0.6300 mg | INHALATION_SOLUTION | Freq: Once | RESPIRATORY_TRACT | Status: AC
  Administered 2013-09-21: 0.63 mg via RESPIRATORY_TRACT

## 2013-09-21 MED ORDER — PREDNISONE 10 MG PO TABS: ORAL_TABLET | ORAL | Status: DC

## 2013-09-21 MED ORDER — LEVOFLOXACIN 500 MG PO TABS: 500.0000 mg | ORAL_TABLET | Freq: Every day | ORAL | Status: AC

## 2013-09-21 NOTE — Telephone Encounter (Signed)
Called spoke with patient - work in appt w/ TP today at 3:45pm.  Pt aware this is a work-in appt and may be a wait.  Nothing further needed; will sign off.

## 2013-09-21 NOTE — Addendum Note (Signed)
Addended by: Maurice March on: 09/21/2013 04:30 PM   Modules accepted: Orders

## 2013-09-21 NOTE — Progress Notes (Signed)
Subjective:     Patient ID: Allison Gross, female   DOB: 1945-10-17 .   MRN: [id number]   Brief patient profile:  68 yowf quit light smoking 1973 with intermittent sinus/ bronchitis but no chronic c/o's or need for rx not much better after stopped smoking then much worse after around 2000 despite daily maint rx for asthma. Previously found to be allergic to dust, cats and trees and mold but no seasonal pattern.    History of Present Illness  04/23/2011 f/u ov/Wert cc 2 flares on symbicort 80 2bid x one year  but sob much imporoved since last visit with NP, no cough.   03/02/2012 Acute OV  Cc hoarseness, dry cough, increased SOB, tightness in chest, fatigue x4days - denies f/c/s, head congestion.  was exposed to wood spoke at daughter's .  Started coughing and wheezing.  No fever or discolored mucus.  Increased SABA use.  >>steroid taper    06/16/12 f/u ov/ Wert cc cough much better at this point but confused with maint vs prns and still using mulitiple prns for the same problem.  rec Tessilon can be used up to every 4 hours as needed  Continue on Pepcid 20mg  At bedtime   Continue on Chlortrimeton 4mg  2 tabs At bedtime > plus prn daytime Stop nasonex > worse so started back  Try to reduce the symbicort to one twice daily   07/14/2012 f/u ov/Wert    Chief Complaint  Patient presents with  . Follow-up    Cough has completely resolved, doing well today  rec Plan A = automatic = symbicort 160 one twice daily and your nasonex once daily but if symptoms worse go to 2 puffs of each every 12 hours Only use your albuterol (Plan B= back up = proaire,  Plan C = Nebulizer which are changing to pure albuterol)  as a rescue medication   10/16/2012  Acute eval/Wert / recurrent cough Chief Complaint  Patient presents with  . Follow-up    c/o sneezing, nasal congestion with yellow mucus, increased cough with clear mucus, and wheezing.  Symptoms started last week.     acute illness x one week,  mucus has been clear, sore throat came on abruptly then resolved, never fever, using saba hfa but not neb yet. Min sob never at rest, main issue is cough control worse at hs and in am rec mucinex dm up to 1200 mg every 12 hours as needed and supplement with tramadol up 50 mg every 4 hours if coughing Ok to use proaire up to 2 puffs every 4 hours for breathing or cough before resorting to your nebulizer Prednisone 10 mg take  4 each am x 2 days,   2 each am x 2 days,  1 each am x 2 days and stop  Once this illness has resolved, ok to try off the allegra to see if your notice any difference in itching, sneezing or runny nose  Please schedule a follow up visit in 3 months but call sooner if needed   01/03/2013 f/u ov/Wert re: cough  Chief Complaint  Patient presents with  . Follow-up    Breathing and cough improved since last OV.  Discuss possibly having food lodged in throat   on symbicort 160 2bid and rare hfa and no neb or need for cough meds  Thinks may have choked on [place] food, still sensation of FB in R pyriform sinus area rec No change in any medication - ok to take allegra  as you feel you need it.   06/21/2013 f/u ov/Wert re: recurrent cough on maint rx with symbicort 160 2bid  Chief Complaint  Patient presents with  . Acute Visit    C/O: cough with clear mucus and wheezing x1 week  was doing fine, then cough  started w/in 10 min of cheese 10 pm at night saba hfa  not really helping much ? For an hour >>pred pack   09/21/2013 Acute OV  Complains of hoarseness, some increased SOB, low grade temp, dry hacking cough with occasional yellow mucus, some tightness x1 week.  She denies any calf pain, edema, hemoptysis, orthopnea, PND, nausea, vomiting, diarrhea. Does feel, that she's had a low-grade fever. Returned yesterday from a 2 week cruise.  Says many people on the cruise were sick with cough and similar symptoms. Was in [place].  Finished zpak on 6/3 and pred pak today by ships'  physician. Has been using tramadol to today for cough with minimal help   Current Medications, Allergies, Past Medical History, Past Surgical History, Family History, and Social History were reviewed in [employer] record.  ROS  The following are not active complaints unless bolded sore throat, dysphagia, dental problems, itching, sneezing,  nasal congestion or excess/ purulent secretions, ear ache,   fever, chills, sweats, unintended wt loss, pleuritic or exertional cp, hemoptysis,  orthopnea pnd or leg swelling, presyncope, palpitations, heartburn, abdominal pain, anorexia, nausea, vomiting, diarrhea  or change in bowel or urinary habits, change in stools or urine, dysuria,hematuria,  rash, arthralgias, visual complaints, headache, numbness weakness or ataxia or problems with walking or coordination,  change in mood/affect or memory.        Past Medical History:  Asthma  - HFA 50% November 10, 2009 > 75% December 24, 2009 > 90% March 26, 2010  Osteopenia  - hold fosfamax 09/2009 >> better with active HB so rec Reclast rx         Objective:   Physical Exam  07/14/2012   158  > 10/16/2012 158 > 01/03/2013 157  > 06/21/2013 164 >159 09/21/2013    GEN: A/Ox3; pleasant , NAD, elderly wf    HEENT:  [place]/AT,  EACs-clear, TMs-wnl, NOSE-clear drainage  drainage THROAT-clear, no lesions,  Max sinus tenderness   NECK:  Supple w/ fair ROM; no JVD; normal carotid impulses w/o bruits; no thyromegaly or nodules palpated; no lymphadenopathy.  RESP  CTA ,   no accessory muscle use, no dullness to percussion, exp wheezing   CARD:  RRR, no m/r/g  , no peripheral edema, pulses intact, no cyanosis or clubbing.  GI:   Soft & nt; nml bowel sounds; no organomegaly or masses detected.  Musco: Warm bil, no deformities or joint swelling noted.        Assessment:

## 2013-09-21 NOTE — Patient Instructions (Signed)
Levaquin 500mg  daily for 7 days  Prednisone taper over next week.  Delsym 2 tsp Twice daily   For cough  Tramadol 50mg  1 every 4hr as needed for breakthrough cough.  Mucinex Twice daily  As needed  Congestion . Saline nasal rinses As needed   Continue on Chlortrimeton 4mg  2 tabs At bedtime  Chest xray today .  Please contact office for sooner follow up if symptoms do not improve or worsen or seek emergency care  Follow up Dr. Melvyn Novas  In 4 weeks and As needed

## 2013-09-21 NOTE — Progress Notes (Signed)
Quick Note:  Called spoke with patient, advised of cxr results / recs as stated by TP. Pt verbalized her understanding and denied any questions. ______ 

## 2013-09-21 NOTE — Addendum Note (Signed)
Addended by: Parke Poisson E on: 09/21/2013 05:14 PM   Modules accepted: Orders

## 2013-09-21 NOTE — Telephone Encounter (Signed)
Ok to see Tammy but I have to leave early today

## 2013-09-21 NOTE — Assessment & Plan Note (Signed)
Slow to resolve flare with bronchitis and upper airway cough  Check cxr  xopenex neb x 1   Plan  Levaquin 500mg  daily for 7 days  Prednisone taper over next week.  Delsym 2 tsp Twice daily   For cough  Tramadol 50mg  1 every 4hr as needed for breakthrough cough.  Mucinex Twice daily  As needed  Congestion . Saline nasal rinses As needed   Continue on Chlortrimeton 4mg  2 tabs At bedtime  Chest xray today .  Please contact office for sooner follow up if symptoms do not improve or worsen or seek emergency care  Follow up Dr. Melvyn Novas  In 4 weeks and As needed

## 2013-09-21 NOTE — Telephone Encounter (Signed)
Allison Gross please advise if and where patient can be worked in as all other docs are 100% booked and MW is leaving early.

## 2013-09-21 NOTE — Telephone Encounter (Addendum)
Spoke with pt Pt reports coming home from a 2 week Cruise to [place] sick Pt states that all the passengers on the ship seemed to have the same thing for the entire 2 weeks. Pt states she saw the ships Doctor - given Prednisone 25mg  - 50mg  qd x 3 days (finished today) and ZPAK (finished 09/19/13) No better after both courses of medications C/o Fever 100 lowgrade, change in color of mucus (yellow), cough, fatigue Using nebs and inhalers as prescribed  [facility] in [place] Reactions  . Sulfonamide Derivatives     unknown   Please advise Dr Melvyn Novas if patient needs to come in and be seen- she lives in [place]. Thanks.

## 2013-09-25 ENCOUNTER — Telehealth: Payer: Self-pay | Admitting: Internal Medicine

## 2013-09-25 NOTE — Telephone Encounter (Signed)
Per OV 09/21/13 with TP: Patient Instructions     Levaquin 500mg  daily for 7 days  Prednisone taper over next week.  Delsym 2 tsp Twice daily For cough  Tramadol 50mg  1 every 4hr as needed for breakthrough cough.  Mucinex Twice daily As needed Congestion .  Saline nasal rinses As needed  Continue on Chlortrimeton 4mg  2 tabs At bedtime  Chest xray today .  Please contact office for sooner follow up if symptoms do not improve or worsen or seek emergency care  Follow up Dr. Melvyn Novas In 4 weeks and As needed   ---  Called spoke with pt. She reports she is getting better. Still coughing some and worse at night, hoarseness d/t cough. She tried delsym and does not work. She is wanting refill on tramadol and hydromet cough syrup.  Pt last had refill on tramadol 06/21/13 #40 x 0 refills 1-2 tans q4hrs prn.  hydromet refilled 05/30/12 #240 ML 56ml's po q6hrs prn. She is going out of town and would like to have these medications on hand for PRN use. Please advise TP tahnks  Allergies  Allergen Reactions  . Sulfonamide Derivatives     unknown

## 2013-09-26 MED ORDER — TRAMADOL HCL 50 MG PO TABS: ORAL_TABLET | ORAL | Status: DC

## 2013-09-26 MED ORDER — HYDROCODONE-HOMATROPINE 5-1.5 MG/5ML PO SYRP: 5.0000 mL | ORAL_SOLUTION | Freq: Four times a day (QID) | ORAL | Status: DC | PRN

## 2013-09-26 NOTE — Telephone Encounter (Signed)
Per TP: okay to refill both the Tramadol and Hydromet - please advise pt to not take both meds at the same time.  Thank you.  Rx's printed for TP to sign and taken to triage

## 2013-09-26 NOTE — Telephone Encounter (Signed)
Pt is aware. Nothing further needed 

## 2013-10-22 ENCOUNTER — Encounter: Payer: Self-pay | Admitting: Internal Medicine

## 2013-10-22 ENCOUNTER — Ambulatory Visit (INDEPENDENT_AMBULATORY_CARE_PROVIDER_SITE_OTHER): Payer: PRIVATE HEALTH INSURANCE | Admitting: Internal Medicine

## 2013-10-22 VITALS — BP 140/90 | HR 73 | Temp 97.9°F | Ht 62.0 in | Wt 159.4 lb

## 2013-10-22 DIAGNOSIS — J45909 Unspecified asthma, uncomplicated: Secondary | ICD-10-CM

## 2013-10-22 MED ORDER — PREDNISONE 10 MG PO TABS: ORAL_TABLET | ORAL | Status: DC

## 2013-10-22 MED ORDER — BUDESONIDE-FORMOTEROL FUMARATE 80-4.5 MCG/ACT IN AERO: INHALATION_SPRAY | RESPIRATORY_TRACT | Status: DC

## 2013-10-22 NOTE — Patient Instructions (Addendum)
Start symbicort 80 Take 2 puffs first thing in am and then another 2 puffs about 12 hours later and fill the prescription if you like it  Prednisone 10 mg take  4 each am x 2 days,   2 each am x 2 days,  1 each am x 2 days and stop   Prilosec (omeprazole) 20 mg Take 30- 60 min before your first and last meals of the day   For drainage take chlortrimeton (chlorpheniramine) 4 mg every 4 hours available over the counter (may cause drowsiness)   If not 100% happy >  See [name] w/in 2 weeks with all your medications, even over the counter meds, separated in two separate bags, the ones you take no matter what vs the ones you stop once you feel better and take only as needed when you feel you need them.   Tammy  will generate for you a new user friendly medication calendar that will put [place] all on the same page re: your medication use.     Without this process, it simply isn't possible to assure that we are providing  your outpatient care  with  the attention to detail we feel you deserve.   If we cannot assure that you're getting that kind of care,  then we cannot manage your problem effectively from this clinic.  Once you have seen Tammy and we are sure that we're all on the same page with your medication use she will arrange follow up with me.

## 2013-10-22 NOTE — Progress Notes (Signed)
Subjective:     Patient ID: Allison Gross, female   DOB: [birth year] .   MRN: [id number]   Brief patient profile:  68 yowf quit light smoking 1973 with intermittent sinus/ bronchitis but no chronic c/o's or need for rx not much better after stopped smoking then much worse after around 2000 despite daily maint rx for asthma. Previously found to be allergic to dust, cats and trees and mold but no seasonal pattern.    History of Present Illness  04/23/2011 f/u ov/Allison Gross cc 2 flares on symbicort 80 2bid x one year  but sob much imporoved since last visit with NP, no cough.   03/02/2012 Acute OV  Cc hoarseness, dry cough, increased SOB, tightness in chest, fatigue x4days - denies f/c/s, head congestion.  was exposed to wood spoke at daughter's .  Started coughing and wheezing.  No fever or discolored mucus.  Increased SABA use.  >>steroid taper    06/16/12 f/u ov/ Allison Gross cc cough much better at this point but confused with maint vs prns and still using mulitiple prns for the same problem.  rec Tessilon can be used up to every 4 hours as needed  Continue on Pepcid 20mg  At bedtime   Continue on Chlortrimeton 4mg  2 tabs At bedtime > plus prn daytime Stop nasonex > worse so started back  Try to reduce the symbicort to one twice daily   07/14/2012 f/u ov/Allison Gross    Chief Complaint  Patient presents with  . Follow-up    Cough has completely resolved, doing well today  rec Plan A = automatic = symbicort 160 one twice daily and your nasonex once daily but if symptoms worse go to 2 puffs of each every 12 hours Only use your albuterol (Plan B= back up = proaire,  Plan C = Nebulizer which are changing to pure albuterol)  as a rescue medication   10/16/2012  Acute eval/Allison Gross / recurrent cough Chief Complaint  Patient presents with  . Follow-up    c/o sneezing, nasal congestion with yellow mucus, increased cough with clear mucus, and wheezing.  Symptoms started last week.     acute illness x one week,  mucus has been clear, sore throat came on abruptly then resolved, never fever, using saba hfa but not neb yet. Min sob never at rest, main issue is cough control worse at hs and in am rec mucinex dm up to 1200 mg every 12 hours as needed and supplement with tramadol up 50 mg every 4 hours if coughing Ok to use proaire up to 2 puffs every 4 hours for breathing or cough before resorting to your nebulizer Prednisone 10 mg take  4 each am x 2 days,   2 each am x 2 days,  1 each am x 2 days and stop  Once this illness has resolved, ok to try off the allegra to see if your notice any difference in itching, sneezing or runny nose  Please schedule a follow up visit in 3 months but call sooner if needed   01/03/2013 f/u ov/Allison Gross re: cough  Chief Complaint  Patient presents with  . Follow-up    Breathing and cough improved since last OV.  Discuss possibly having food lodged in throat   on symbicort 160 2bid and rare hfa and no neb or need for cough meds  Thinks may have choked on [place] food, still sensation of FB in R pyriform sinus area rec No change in any medication - ok to take allegra  as you feel you need it.   06/21/2013 f/u ov/Allison Gross re: recurrent cough on maint rx with symbicort 160 2bid  Chief Complaint  Patient presents with  . Acute Visit    C/O: cough with clear mucus and wheezing x1 week  was doing fine, then cough  started w/in 10 min of cheese 10 pm at night saba hfa  not really helping much ? For an hour >>pred pack > 100% better x months   09/21/2013 Acute OV  Complains of hoarseness, some increased SOB, low grade temp, dry hacking cough with occasional yellow mucus, some tightness x 1 week.  She denies any calf pain, edema, hemoptysis, orthopnea, PND, nausea, vomiting, diarrhea. Does feel, that she's had a low-grade fever. Returned yesterday from a 2 week cruise.  Says many people on the cruise were sick with cough and similar symptoms. Was in [place].  Finished zpak on 6/3 and pred  pak today by ships' physician. Has been using tramadol to today for cough with minimal help rec Levaquin 500mg  daily for 7 days  Prednisone taper over next week.  Delsym 2 tsp Twice daily   For cough  Tramadol 50mg  1 every 4hr as needed for breakthrough cough.  Mucinex Twice daily  As needed  Congestion . Saline nasal rinses As needed   Continue on Chlortrimeton 4mg  2 tabs At bedtime     rx 40% improved then relapsed while in [place] p driving there    07/21/100 f/u ov/Allison Gross re: maintaining on symbicort 160 and saba twice daily , no neb  Chief Complaint  Patient presents with  . Follow-up    Pt c/o increased cough x 5 days- occ prod with minimal clear sputum.  She is using rescue inhaler approx 2 x per day since cough started back.  Has not had to use neb. She states "I feel tightness in my throat".    Constant sense of daytime, not noct, pnds, not consistent with ppi / has not tried 1st gen H1 daytime   No obvious day to day or daytime variabilty or assoc   cp or chest tightness, subjective wheeze overt sinus or hb symptoms. No unusual exp hx or h/o childhood pna/ asthma or knowledge of premature birth.  Sleeping ok without nocturnal  or early am exacerbation  of respiratory  c/o's or need for noct saba. Also denies any obvious fluctuation of symptoms with weather or environmental changes or other aggravating or alleviating factors except as outlined above   Current Medications, Allergies, Complete Past Medical History, Past Surgical History, Family History, and Social History were reviewed in [employer] record.  ROS  The following are not active complaints unless bolded sore throat, dysphagia, dental problems, itching, sneezing,  nasal congestion or excess/ purulent secretions, ear ache,   fever, chills, sweats, unintended wt loss, pleuritic or exertional cp, hemoptysis,  orthopnea pnd or leg swelling, presyncope, palpitations, heartburn, abdominal pain,  anorexia, nausea, vomiting, diarrhea  or change in bowel or urinary habits, change in stools or urine, dysuria,hematuria,  rash, arthralgias, visual complaints, headache, numbness weakness or ataxia or problems with walking or coordination,  change in mood/affect or memory.             Past Medical History:  Asthma  - HFA 50% November 10, 2009 > 75% December 24, 2009 > 90% March 26, 2010  Osteopenia  - hold fosfamax 09/2009 >> better with active HB so rec Reclast rx  Objective:   Physical Exam  07/14/2012   158  > 10/16/2012 158 > 01/03/2013 157  > 06/21/2013 164 >159 09/21/2013 > 10/22/2013 159    GEN: A/Ox3; pleasant , NAD, elderly wf    HEENT:  [place]/AT,  EACs-clear, TMs-wnl, NOSE-clear drainage  drainage THROAT-clear, no lesions,  Max sinus tenderness   NECK:  Supple w/ fair ROM; no JVD; normal carotid impulses w/o bruits; no thyromegaly or nodules palpated; no lymphadenopathy.  RESP  CTA ,   no accessory muscle use, no dullness to percussion, exp wheezing   CARD:  RRR, no m/r/g  , no peripheral edema, pulses intact, no cyanosis or clubbing.  GI:   Soft & nt; nml bowel sounds; no organomegaly or masses detected.  Musco: Warm bil, no deformities or joint swelling noted.     cxr 09/21/13 Borderline cardiomegaly without acute process  Stable small hiatal hernia  Assessment:

## 2013-10-23 ENCOUNTER — Other Ambulatory Visit: Payer: Self-pay | Admitting: Internal Medicine

## 2013-10-23 NOTE — Assessment & Plan Note (Addendum)
-   c/o hoarseness > added spacer 12/01/2012  > improved but stopped using it   DDX of  difficult airways management all start with A and  include Adherence, Ace Inhibitors, Acid Reflux, Active Sinus Disease, Alpha 1 Antitripsin deficiency, Anxiety masquerading as Airways dz,  ABPA,  allergy(esp in young), Aspiration (esp in elderly), Adverse effects of DPI,  Active smokers, plus two Bs  = Bronchiectasis and Beta blocker use..and one C= CHF  Adherence is always the initial "prime suspect" and is a multilayered concern that requires a "trust but verify" approach in every patient - starting with knowing how to use medications, especially inhalers, correctly, keeping up with refills and understanding the fundamental difference between maintenance and prns vs those medications only taken for a very short course and then stopped and not refilled.  The proper method of use, as well as anticipated side effects, of a metered-dose inhaler are discussed and demonstrated to the patient. Improved effectiveness after extensive coaching during this visit to a level of approximately  90% so try symbicort 80 2bid instead of 160 one twice daily and if not 100% satisfied first step is med reconciliation  ? Acid (or non-acid) GERD > always difficult to exclude as up to 75% of pts in some series report no assoc GI/ Heartburn symptoms> rec max (24h)  acid suppression and diet restrictions/ reviewed and instructions given in writing.   ? Allergy/ pnds > does not want to see allergist, singulair tried and failed in past  so rec daytime h1 if tolerated and short term  Prednisone 10 mg take  4 each am x 2 days,   2 each am x 2 days,  1 each am x 2 days and stop     Each maintenance medication was reviewed in detail including most importantly the difference between maintenance and as needed and under what circumstances the prns are to be used.  Please see instructions for details which were reviewed in writing and the patient given  a copy.

## 2013-12-21 ENCOUNTER — Telehealth: Payer: Self-pay | Admitting: Internal Medicine

## 2013-12-21 NOTE — Telephone Encounter (Signed)
Called pt home # line rang busy x 4  Called mobile # [email or web address] x1 for pt

## 2013-12-21 NOTE — Telephone Encounter (Signed)
Spoke with the pt  She sees MW for asthma  Last seen on 10/22/13  She c/o sinus pressure and nasal congestion for the past 3 days  She is taking mucinex bid and using saline nasal rinse 3-4 times per day  She also took sudafed x 3 days with no relief, so has stopped this so her BP won't get to elevated  She denies any cough "yet" No wheezing, SOB, CP, chest congestion, f/c/s or other co's  She is requesting further recs since this is a long wkend  Please advise thanks! Allergies  Allergen Reactions  . Sulfonamide Derivatives     unknown

## 2013-12-21 NOTE — Telephone Encounter (Signed)
I'm not comfortable giving prednisone for this constellation of sx. You can ask her to change her chlorpheniramine to 4mg  q6h, except for the night dose of 8mg . Otherwise she needs to continue what she is doing. I agree with holding the pseudoephedrine.

## 2013-12-21 NOTE — Telephone Encounter (Signed)
Pt called back. Aware of recs. Nothing further needed 

## 2014-02-25 ENCOUNTER — Telehealth: Payer: Self-pay | Admitting: Internal Medicine

## 2014-02-25 MED ORDER — ALBUTEROL SULFATE HFA 108 (90 BASE) MCG/ACT IN AERS: 2.0000 | INHALATION_SPRAY | Freq: Four times a day (QID) | RESPIRATORY_TRACT | Status: DC | PRN

## 2014-02-25 NOTE — Telephone Encounter (Signed)
Called pt. Aware albuterol refilled. She is going to call back to make appt. Nothing further needed

## 2014-04-22 ENCOUNTER — Ambulatory Visit (INDEPENDENT_AMBULATORY_CARE_PROVIDER_SITE_OTHER): Payer: BLUE CROSS/BLUE SHIELD | Admitting: Adult Health

## 2014-04-22 ENCOUNTER — Encounter: Payer: Self-pay | Admitting: Adult Health

## 2014-04-22 ENCOUNTER — Telehealth: Payer: Self-pay | Admitting: Internal Medicine

## 2014-04-22 VITALS — BP 132/82 | HR 83 | Temp 98.1°F | Ht 62.0 in | Wt 161.0 lb

## 2014-04-22 DIAGNOSIS — J4531 Mild persistent asthma with (acute) exacerbation: Secondary | ICD-10-CM

## 2014-04-22 MED ORDER — HYDROCODONE-HOMATROPINE 5-1.5 MG/5ML PO SYRP: 5.0000 mL | ORAL_SOLUTION | Freq: Four times a day (QID) | ORAL | Status: DC | PRN

## 2014-04-22 MED ORDER — AMOXICILLIN-POT CLAVULANATE 875-125 MG PO TABS: 1.0000 | ORAL_TABLET | Freq: Two times a day (BID) | ORAL | Status: AC

## 2014-04-22 MED ORDER — PREDNISONE 10 MG PO TABS: ORAL_TABLET | ORAL | Status: DC

## 2014-04-22 NOTE — Assessment & Plan Note (Signed)
Flare with bronchitis   Plan  Augmentin 875mg  Twice daily for 7 days , take w/ food.  Prednisone taper over next week.  Mucinex DM twice daily as needed. For cough and congestion. Hydromet 1 teaspoons every 4-6 hours as needed. For cough, may make you sleepy Please contact office for sooner follow up if symptoms do not improve or worsen or seek emergency care  Follow up Dr. Melvyn Novas  In 6  weeks and As needed

## 2014-04-22 NOTE — Telephone Encounter (Signed)
Pt c/o persistent cough, prod (white).  Some sob with cough.  Low grade temp.  Denies wheezing or chest tightness.  Using sinus rinse.  Pt given appt with Tammy Parrett today at 3:15.

## 2014-04-22 NOTE — Progress Notes (Signed)
Subjective:     Patient ID: Allison Gross, female   DOB: December 09, 1945 .   MRN: [id number]   Brief patient profile:  68 yowf quit light smoking 1973 with intermittent sinus/ bronchitis but no chronic c/o's or need for rx not much better after stopped smoking then much worse after around 2000 despite daily maint rx for asthma. Previously found to be allergic to dust, cats and trees and mold but no seasonal pattern.    History of Present Illness  04/23/2011 f/u ov/Wert cc 2 flares on symbicort 80 2bid x one year  but sob much imporoved since last visit with NP, no cough.   03/02/2012 Acute OV  Cc hoarseness, dry cough, increased SOB, tightness in chest, fatigue x4days - denies f/c/s, head congestion.  was exposed to wood spoke at daughter's .  Started coughing and wheezing.  No fever or discolored mucus.  Increased SABA use.  >>steroid taper    06/16/12 f/u ov/ Wert cc cough much better at this point but confused with maint vs prns and still using mulitiple prns for the same problem.  rec Tessilon can be used up to every 4 hours as needed  Continue on Pepcid 20mg  At bedtime   Continue on Chlortrimeton 4mg  2 tabs At bedtime > plus prn daytime Stop nasonex > worse so started back  Try to reduce the symbicort to one twice daily   07/14/2012 f/u ov/Wert    Chief Complaint  Patient presents with  . Follow-up    Cough has completely resolved, doing well today  rec Plan A = automatic = symbicort 160 one twice daily and your nasonex once daily but if symptoms worse go to 2 puffs of each every 12 hours Only use your albuterol (Plan B= back up = proaire,  Plan C = Nebulizer which are changing to pure albuterol)  as a rescue medication   10/16/2012  Acute eval/Wert / recurrent cough Chief Complaint  Patient presents with  . Follow-up    c/o sneezing, nasal congestion with yellow mucus, increased cough with clear mucus, and wheezing.  Symptoms started last week.     acute illness x one week,  mucus has been clear, sore throat came on abruptly then resolved, never fever, using saba hfa but not neb yet. Min sob never at rest, main issue is cough control worse at hs and in am rec mucinex dm up to 1200 mg every 12 hours as needed and supplement with tramadol up 50 mg every 4 hours if coughing Ok to use proaire up to 2 puffs every 4 hours for breathing or cough before resorting to your nebulizer Prednisone 10 mg take  4 each am x 2 days,   2 each am x 2 days,  1 each am x 2 days and stop  Once this illness has resolved, ok to try off the allegra to see if your notice any difference in itching, sneezing or runny nose  Please schedule a follow up visit in 3 months but call sooner if needed   01/03/2013 f/u ov/Wert re: cough  Chief Complaint  Patient presents with  . Follow-up    Breathing and cough improved since last OV.  Discuss possibly having food lodged in throat   on symbicort 160 2bid and rare hfa and no neb or need for cough meds  Thinks may have choked on [place] food, still sensation of FB in R pyriform sinus area rec No change in any medication - ok to take allegra  as you feel you need it.   06/21/2013 f/u ov/Wert re: recurrent cough on maint rx with symbicort 160 2bid  Chief Complaint  Patient presents with  . Acute Visit    C/O: cough with clear mucus and wheezing x1 week  was doing fine, then cough  started w/in 10 min of cheese 10 pm at night saba hfa  not really helping much ? For an hour >>pred pack > 100% better x months   09/21/2013 Acute OV  Complains of hoarseness, some increased SOB, low grade temp, dry hacking cough with occasional yellow mucus, some tightness x 1 week.  She denies any calf pain, edema, hemoptysis, orthopnea, PND, nausea, vomiting, diarrhea. Does feel, that she's had a low-grade fever. Returned yesterday from a 2 week cruise.  Says many people on the cruise were sick with cough and similar symptoms. Was in [place].  Finished zpak on 6/3 and pred  pak today by ships' physician. Has been using tramadol to today for cough with minimal help rec Levaquin 500mg  daily for 7 days  Prednisone taper over next week.  Delsym 2 tsp Twice daily   For cough  Tramadol 50mg  1 every 4hr as needed for breakthrough cough.  Mucinex Twice daily  As needed  Congestion . Saline nasal rinses As needed   Continue on Chlortrimeton 4mg  2 tabs At bedtime     rx 40% improved then relapsed while in [place] p driving there    04/24/1094 f/u ov/Wert re: maintaining on symbicort 160 and saba twice daily , no neb  Chief Complaint  Patient presents with  . Follow-up    Pt c/o increased cough x 5 days- occ prod with minimal clear sputum.  She is using rescue inhaler approx 2 x per day since cough started back.  Has not had to use neb. She states "I feel tightness in my throat".    Constant sense of daytime, not noct, pnds, not consistent with ppi / has not tried 1st gen H1 daytime >>symbicort and pred taper   04/22/2014 Acute OV  Patient presents for an acute office visit She complains of a one-week history of productive cough with thick, green yellow mucus, low-grade fevers and barking cough. Patient had intermittent wheezing. She has been using Mucinex and Tessalon without much relief. Complains that cough is keeping her up at night. Patient is not been seen in greater than 6 months. P reports that she's been doing exceptionally well up until last week She denies any hemoptysis, orthopnea, PND, leg swelling, or unintentional weight loss  Current Medications, Allergies, Complete Past Medical History, Past Surgical History, Family History, and Social History were reviewed in [employer] record.  ROS  Constitutional:   No  weight loss, night sweats,  + Fevers, chills, fatigue, or  lassitude.  HEENT:   No headaches,  Difficulty swallowing,  Tooth/dental problems, or  Sore throat,                No sneezing, itching, ear ache,  +nasal  congestion, post nasal drip,   CV:  No chest pain,  Orthopnea, PND, swelling in lower extremities, anasarca, dizziness, palpitations, syncope.   GI  No heartburn, indigestion, abdominal pain, nausea, vomiting, diarrhea, change in bowel habits, loss of appetite, bloody stools.   Resp:  .  No chest wall deformity  Skin: no rash or lesions.  GU: no dysuria, change in color of urine, no urgency or frequency.  No flank pain, no hematuria  MS:  No joint pain or swelling.  No decreased range of motion.  No back pain.  Psych:  No change in mood or affect. No depression or anxiety.  No memory loss.     Past Medical History:  Asthma  - HFA 50% November 10, 2009 > 75% December 24, 2009 > 90% March 26, 2010  Osteopenia  - hold fosfamax 09/2009 >> better with active HB so rec Reclast rx         Objective:   Physical Exam  07/14/2012   158  > 10/16/2012 158 > 01/03/2013 157  > 06/21/2013 164 >159 09/21/2013 > 10/22/2013 159    GEN: A/Ox3; pleasant , NAD, elderly wf    HEENT:  [place]/AT,  EACs-clear, TMs-wnl, NOSE-clear drainage  drainage THROAT-clear, no lesions,     NECK:  Supple w/ fair ROM; no JVD; normal carotid impulses w/o bruits; no thyromegaly or nodules palpated; no lymphadenopathy.  RESP  CTA ,   no accessory muscle use, no dullness to percussion, faint exp wheezing   CARD:  RRR, no m/r/g  , no peripheral edema, pulses intact, no cyanosis or clubbing.  GI:   Soft & nt; nml bowel sounds; no organomegaly or masses detected.  Musco: Warm bil, no deformities or joint swelling noted.     cxr 09/21/13 Borderline cardiomegaly without acute process  Stable small hiatal hernia  Assessment:

## 2014-04-22 NOTE — Patient Instructions (Signed)
Augmentin 875mg  Twice daily for 7 days , take w/ food.  Prednisone taper over next week.  Mucinex DM twice daily as needed. For cough and congestion. Hydromet 1 teaspoons every 4-6 hours as needed. For cough, may make you sleepy Please contact office for sooner follow up if symptoms do not improve or worsen or seek emergency care  Follow up Dr. Melvyn Novas  In 6  weeks and As needed

## 2014-04-25 ENCOUNTER — Telehealth: Payer: Self-pay | Admitting: Internal Medicine

## 2014-04-25 NOTE — Telephone Encounter (Signed)
Pt advised. Jennifer Castillo, CMA  

## 2014-04-25 NOTE — Telephone Encounter (Signed)
Too soon to tell whether responding > ov tomorrow with all meds in hand to regroup if not better by then

## 2014-04-25 NOTE — Telephone Encounter (Signed)
Called and spoke to pt. Pt stated since pt saw TP on 04/22/14 and was given pred and Augmentin and currently still taking the medication. Pt c/o sore throat, insomnia secondary to cough, prod cough with white mucus and one instance of hemoptysis (pt stated less than a tsp in size), increase in SOB and wheezing. Pt stated she has had a 99.4 temperature. Pt denies chest congestion and CP/tightness.   Dr. Melvyn Novas please advise.   Allergies  Allergen Reactions  . Sulfonamide Derivatives     unknown    Current Outpatient Prescriptions on File Prior to Visit  Medication Sig Dispense Refill  . albuterol (PROAIR HFA) 108 (90 BASE) MCG/ACT inhaler Inhale 2 puffs into the lungs every 6 (six) hours as needed. 1 Inhaler 0  . albuterol (PROVENTIL) (2.5 MG/3ML) 0.083% nebulizer solution Take 3 mLs (2.5 mg total) by nebulization every 6 (six) hours as needed for wheezing. 75 mL 12  . amoxicillin-clavulanate (AUGMENTIN) 875-125 MG per tablet Take 1 tablet by mouth 2 (two) times daily. 14 tablet 0  . benzonatate (TESSALON) 200 MG capsule Take 1 capsule (200 mg total) by mouth 3 (three) times daily as needed for cough. 45 capsule 1  . budesonide-formoterol (SYMBICORT) 80-4.5 MCG/ACT inhaler Take 2 puffs first thing in am and then another 2 puffs about 12 hours later. 1 Inhaler 11  . calcium-vitamin D (OSCAL WITH D) 500-200 MG-UNIT per tablet Take 1 tablet by mouth daily.      . carbamazepine (TEGRETOL XR) 200 MG 12 hr tablet Take 200 mg by mouth 2 (two) times daily.      . chlorpheniramine (CHLOR-TRIMETON) 4 MG tablet Take 8 mg by mouth at bedtime.    Marland Kitchen dextromethorphan-guaiFENesin (MUCINEX DM) 30-600 MG per 12 hr tablet Take 1-2 tablets by mouth every 12 (twelve) hours as needed.     . fexofenadine (ALLEGRA) 180 MG tablet Take 180 mg by mouth daily.      Marland Kitchen HYDROcodone-homatropine (HYDROMET) 5-1.5 MG/5ML syrup Take 5 mLs by mouth every 6 (six) hours as needed for cough. 240 mL 0  . HYDROcodone-homatropine (HYDROMET)  5-1.5 MG/5ML syrup Take 5 mLs by mouth every 6 (six) hours as needed. 240 mL 0  . Hypertonic Nasal Wash (SINUS RINSE NA) as directed.      Marland Kitchen losartan-hydrochlorothiazide (HYZAAR) 50-12.5 MG per tablet Take 1 tablet by mouth daily.    . Multiple Vitamin (MULTIVITAMIN) tablet Take 1 tablet by mouth daily.      Marland Kitchen NASONEX 50 MCG/ACT nasal spray USE TWO SPRAYS IN EACH NOSTRIL EVERY DAY. 17 g 11  . omeprazole (PRILOSEC) 20 MG capsule Take 20 mg by mouth daily.      . Potassium Chloride ER 20 MEQ TBCR Take 1 tablet by mouth daily.    . predniSONE (DELTASONE) 10 MG tablet 4 tabs for 2 days, then 3 tabs for 2 days, 2 tabs for 2 days, then 1 tab for 2 days, then stop 20 tablet 0  . Spacer/Aero-Holding Chambers (AEROCHAMBER MINI CHAMBER) DEVI Inhale 2 puffs into the lungs 2 (two) times daily. Use as directed with Symbicort 1 Device 0  . traMADol (ULTRAM) 50 MG tablet 1-2 every 4 hours as needed for cough or pain 40 tablet 0  . vitamin B-12 (CYANOCOBALAMIN) 500 MCG tablet Take 500 mcg by mouth daily.      . vitamin C (ASCORBIC ACID) 500 MG tablet Take 500 mg by mouth daily.       No current facility-administered medications on  file prior to visit.

## 2014-04-26 ENCOUNTER — Encounter: Payer: Self-pay | Admitting: Internal Medicine

## 2014-04-26 ENCOUNTER — Ambulatory Visit (INDEPENDENT_AMBULATORY_CARE_PROVIDER_SITE_OTHER): Payer: BLUE CROSS/BLUE SHIELD | Admitting: Internal Medicine

## 2014-04-26 ENCOUNTER — Telehealth: Payer: Self-pay | Admitting: Internal Medicine

## 2014-04-26 ENCOUNTER — Ambulatory Visit (INDEPENDENT_AMBULATORY_CARE_PROVIDER_SITE_OTHER)
Admission: RE | Admit: 2014-04-26 | Discharge: 2014-04-26 | Disposition: A | Discharge status: Home / Self Care | Payer: BLUE CROSS/BLUE SHIELD | Source: Ambulatory Visit | Attending: Internal Medicine | Admitting: Internal Medicine

## 2014-04-26 VITALS — BP 124/78 | HR 95 | Temp 98.4°F | Ht 62.0 in | Wt 159.6 lb

## 2014-04-26 DIAGNOSIS — R05 Cough: Secondary | ICD-10-CM

## 2014-04-26 DIAGNOSIS — R058 Other specified cough: Secondary | ICD-10-CM

## 2014-04-26 DIAGNOSIS — J4530 Mild persistent asthma, uncomplicated: Secondary | ICD-10-CM

## 2014-04-26 MED ORDER — TRAMADOL HCL 50 MG PO TABS: ORAL_TABLET | ORAL | Status: DC

## 2014-04-26 MED ORDER — FLUTTER DEVI: Status: AC

## 2014-04-26 MED ORDER — PREDNISONE 10 MG PO TABS: ORAL_TABLET | ORAL | Status: DC

## 2014-04-26 NOTE — Telephone Encounter (Signed)
Spoke with pt. States that she is not feeling any better. Reports cough with production of white mucus and wheezing. Has been taking Prednisone, antibiotic, Mucinex and using nebulizer with out any relief.  MW - please advise.

## 2014-04-26 NOTE — Telephone Encounter (Signed)
Can't treat this over the phone, will need ov with all meds in hand me or Tammy ASAP

## 2014-04-26 NOTE — Patient Instructions (Addendum)
In the the event of cough for any reason:  Use the flutter valve as much as possible  mucinex dm 1200 mg every 12 hours and supplement with 2 tramadol until no longer coughing And omeprazole 40 mg Take 30- 60 min before your first and last meals of the day and Pepcid 20 mg one at bedtime   For drainage take chlortrimeton (chlorpheniramine) 4 mg every 4 hours available over the counter (may cause drowsiness)  Prednisone 10 mg take  4 each am x 2 days,   2 each am x 2 days,  1 each am x 2 days and stop    GERD (REFLUX)  is an extremely common cause of respiratory symptoms just like yours , many times with no obvious heartburn at all.    It can be treated with medication, but also with lifestyle changes including avoidance of late meals, excessive alcohol, smoking cessation, and avoid fatty foods, chocolate, peppermint, colas, red wine, and acidic juices such as orange juice.  NO MINT OR MENTHOL PRODUCTS SO NO COUGH DROPS  USE SUGARLESS CANDY INSTEAD (Jolley ranchers or Stover's or Life Savers) or even ice chips will also do - the key is to swallow to prevent all throat clearing. NO OIL BASED VITAMINS - use powdered substitutes.   Please remember to go to the  x-ray department downstairs for your tests - we will call you with the results when they are available.  If not better see [name] with all meds in hand  Add needs med calendar

## 2014-04-26 NOTE — Progress Notes (Signed)
Subjective:     Patient ID: Allison Gross, female   DOB: May 01, 1945 .   MRN: [id number]   Brief patient profile:  68 yowf quit light smoking 1973 with intermittent sinus/ bronchitis but no chronic c/o's or need for rx not much better after stopped smoking then much worse after around 2000 despite daily maint rx for asthma. Previously found to be allergic to dust, cats and trees and mold but no seasonal pattern.    History of Present Illness  09/21/2013 Acute OV  Complains of hoarseness, some increased SOB, low grade temp, dry hacking cough with occasional yellow mucus, some tightness x 1 week.  She denies any calf pain, edema, hemoptysis, orthopnea, PND, nausea, vomiting, diarrhea. Does feel, that she's had a low-grade fever. Returned yesterday from a 2 week cruise.  Says many people on the cruise were sick with cough and similar symptoms. Was in [place].  Finished zpak on 6/3 and pred pak today by ships' physician. Has been using tramadol to today for cough with minimal help rec Levaquin 500mg  daily for 7 days  Prednisone taper over next week.  Delsym 2 tsp Twice daily   For cough  Tramadol 50mg  1 every 4hr as needed for breakthrough cough.  Mucinex Twice daily  As needed  Congestion . 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 Follow-up    Pt c/o increased cough x 5 days- occ prod with minimal clear sputum.  She is using rescue inhaler approx 2 x per day since cough started back.  Has not had to use neb. She states "I feel tightness in my throat".    Constant sense of daytime, not noct, pnds, not consistent with ppi / has not tried 1st gen H1 daytime >>symbicort and pred taper   04/22/2014 Acute OV  Patient presents for an acute office visit She  complains of a one-week history of productive cough with thick, green yellow mucus, low-grade fevers and barking cough. Patient had intermittent wheezing. She has been using Mucinex and Tessalon without much relief. Complains that cough is keeping her up at night.  reports that she's been doing exceptionally well up until last week rec Augmentin 875mg  Twice daily for 7 days , take w/ food.  Prednisone taper over next week.  Mucinex DM twice daily as needed. For cough and congestion. Hydromet 1 teaspoons every 4-6 hours as needed. For cough, may make you sleepy   04/26/2014 acute  ov/Wyona Neils re: recurrent cough  Chief Complaint  Patient presents with  . Acute Visit    Pt states that her cough is no better since acute visit here on 04/22/14. Her wheezing seems to be getting worse- esp at night. She is using proair about 4 x per day, and duoneb 2-3 x per day.    10/22/13 -p xmas 2015 doing great on symb 80 2 bid , allegra daily, omeprazole 20 qam / h1 x 2 at hs but not h2, then acutely ill with productive cough / sore throat/ subj wheeze esp at hs  But fully ambulatory and note the saba not helping her "wheezing" even in neb form Tramadol x one no better / tessilon no better, hycodan no better   No  obvious day to day or daytime variabilty or assoc  cp or chest tightness   overt sinus or hb symptoms. No unusual exp hx or h/o childhood pna/ asthma or knowledge of premature birth.   Also denies any obvious fluctuation of symptoms with weather or environmental changes or other aggravating or alleviating factors except as outlined above   Current Medications, Allergies, Complete Past Medical History, Past Surgical History, Family History, and Social History were reviewed in [employer] record.  ROS  The following are not active complaints unless bolded sore throat, dysphagia, dental problems, itching, sneezing,  nasal congestion or excess/ purulent secretions, ear ache,   fever,  chills, sweats, unintended wt loss, pleuritic or exertional cp, hemoptysis,  orthopnea pnd or leg swelling, presyncope, palpitations, heartburn, abdominal pain, anorexia, nausea, vomiting, diarrhea  or change in bowel or urinary habits, change in stools or urine, dysuria,hematuria,  rash, arthralgias, visual complaints, headache, numbness weakness or ataxia or problems with walking or coordination,  change in mood/affect or memory.              .     Past Medical History:  Asthma  - HFA 50% November 10, 2009 > 75% December 24, 2009 > 90% March 26, 2010  Osteopenia  - hold fosfamax 09/2009 >> better with active HB so rec Reclast rx         Objective:   Physical Exam   07/14/2012   158  > 10/16/2012 158 > 01/03/2013 157  > 06/21/2013 164 >159 09/21/2013 > 10/22/2013 159 > 04/26/2014 160    GEN: A/Ox3; pleasant , NAD, elderly wf  Not toxic at all   HEENT:  [place]/AT,  EACs-clear, TMs-wnl, NOSE-clear drainage  drainage THROAT-clear, no lesions,     NECK:  Supple w/ fair ROM; no JVD; normal carotid impulses w/o bruits; no thyromegaly or nodules palpated; no lymphadenopathy.  RESP  CTA ,   no accessory muscle use, no dullness to percussion, Min exp wheezing resolves with plm   CARD:  RRR, no m/r/g  , no peripheral edema, pulses intact, no cyanosis or clubbing.  GI:   Soft & nt; nml bowel sounds; no organomegaly or masses detected.  Musco: Warm bil, no deformities or joint swelling noted.     CXR PA and Lateral:   04/26/2014 :  No active cardiopulmonary disease.    Assessment:

## 2014-04-26 NOTE — Telephone Encounter (Signed)
[  t says please call her bk on her cell [phone].Allison Gross

## 2014-04-26 NOTE — Telephone Encounter (Signed)
Called and spoke with pt and she is aware of appt today with MW at 2:45 and she is aware to bring all of her meds with her.

## 2014-04-26 NOTE — Telephone Encounter (Signed)
Pt requesting when MW replies to then call on her cell and not her home phone.

## 2014-04-27 ENCOUNTER — Encounter: Payer: Self-pay | Admitting: Internal Medicine

## 2014-04-27 NOTE — Assessment & Plan Note (Signed)
-   HFA 90% p coaching 04/23/11 - c/o hoarseness > added spacer 12/01/2012  > improved but stopped using it  - maint on symbicort 80 2bid      Present flare not responding to even saba neb so doubt it's asthma at all but rather uacs (see sep a/p)  The proper method of use, as well as anticipated side effects, of a metered-dose inhaler are discussed and demonstrated to the patient. Improved effectiveness after extensive coaching during this visit to a level of approximately  90%   Added flutter valve to help with upper airway "wheezing"

## 2014-04-27 NOTE — Assessment & Plan Note (Signed)
Had been doing great now with cough and refractory "wheeze" which appears all upper airway   this is c/w  Classic Upper airway cough syndrome, so named because it's frequently impossible to sort out how much is  CR/sinusitis with freq throat clearing (which can be related to primary GERD)   vs  causing  secondary (" extra esophageal")  GERD from wide swings in gastric pressure that occur with throat clearing, often  promoting self use of mint and menthol lozenges that reduce the lower esophageal sphincter tone and exacerbate the problem further in a cyclical fashion.   These are the same pts (now being labeled as having "irritable larynx syndrome" by some cough centers) who not infrequently have a history of having failed to tolerate ace inhibitors,  dry powder inhalers or biphosphonates or report having atypical reflux symptoms that don't respond to standard doses of PPI , and are easily confused as having aecopd or asthma flares by even experienced allergists/ pulmonologists.   rec finish abx/start new pred taper but this time max rx for gerd/cyclical coughing  See instructions for specific recommendations which were reviewed directly with the patient who was given a copy with highlighter outlining the key components.

## 2014-04-29 ENCOUNTER — Telehealth: Payer: Self-pay | Admitting: Internal Medicine

## 2014-04-29 NOTE — Telephone Encounter (Signed)
Pt has already been notified. Nothing further needed.  Notes Recorded by Rosana Berger, CMA on 04/29/2014 at 3:38 PM Spoke with pt and notified of results per Dr. Melvyn Novas. Pt verbalized understanding and denied any questions.  Notes Recorded by Tanda Rockers, MD on 04/27/2014 at 5:51 AM Call pt: Reviewed cxr and no acute change so no change in recommendations made at [facility]

## 2014-04-29 NOTE — Progress Notes (Signed)
Quick Note:  LMTCB ______ 

## 2014-04-29 NOTE — Progress Notes (Signed)
Quick Note:  Spoke with pt and notified of results per Dr. Wert. Pt verbalized understanding and denied any questions.  ______ 

## 2014-05-31 ENCOUNTER — Other Ambulatory Visit: Payer: Self-pay | Admitting: Internal Medicine

## 2014-05-31 ENCOUNTER — Encounter: Payer: Self-pay | Admitting: *Deleted

## 2014-05-31 MED ORDER — MOMETASONE FUROATE 50 MCG/ACT NA SUSP: NASAL | Status: DC

## 2014-06-03 ENCOUNTER — Ambulatory Visit: Payer: Self-pay | Admitting: Internal Medicine

## 2014-06-05 ENCOUNTER — Ambulatory Visit (INDEPENDENT_AMBULATORY_CARE_PROVIDER_SITE_OTHER): Payer: BLUE CROSS/BLUE SHIELD | Admitting: Internal Medicine

## 2014-06-05 ENCOUNTER — Encounter: Payer: Self-pay | Admitting: Internal Medicine

## 2014-06-05 VITALS — BP 132/84 | HR 72 | Ht 62.0 in | Wt 158.0 lb

## 2014-06-05 DIAGNOSIS — J4530 Mild persistent asthma, uncomplicated: Secondary | ICD-10-CM | POA: Diagnosis not present

## 2014-06-05 DIAGNOSIS — R05 Cough: Secondary | ICD-10-CM

## 2014-06-05 DIAGNOSIS — R058 Other specified cough: Secondary | ICD-10-CM

## 2014-06-05 NOTE — Patient Instructions (Addendum)
Decrease the omeprazole to Take 30-60 min before first meal of the day only but continue the pepcid   For drainage take chlortrimeton (chlorpheniramine) 4 mg every 4 hours available over the counter (may cause drowsiness)   See [name] 4 weeks with all your medications, even over the counter meds, separated in two separate bags, the ones you take no matter what vs the ones you stop once you feel better and take only as needed when you feel you need them.   Tammy  will generate for you a new user friendly medication calendar that will put [place] all on the same page re: your medication use.     Without this process, it simply isn't possible to assure that we are providing  your outpatient care  with  the attention to detail we feel you deserve.   If we cannot assure that you're getting that kind of care,  then we cannot manage your problem effectively from this clinic.  Once you have seen Tammy and we are sure that we're all on the same page with your medication use she will arrange follow up with me.

## 2014-06-05 NOTE — Assessment & Plan Note (Signed)
-  HFA 90% p coaching 04/23/11 - c/o hoarseness > added spacer 12/01/2012  > improved but stopped using it  - maint on symbicort 80 2bid   All goals of chronic asthma control met including optimal function and elimination of symptoms with minimal need for rescue therapy.  Contingencies discussed in full including contacting this office immediately if not controlling the symptoms using the rule of two's.

## 2014-06-05 NOTE — Assessment & Plan Note (Signed)
Very difficult to tease out the asthma / cough variant issue from the upper airway cough syndrome  I had an extended discussion with the patient reviewing all relevant studies completed to date and  lasting 15 to 20 minutes of a 25 minute visit on the following ongoing concerns:    Each maintenance medication was reviewed in detail including most importantly the difference between maintenance and as needed and under what circumstances the prns are to be used.  Please see instructions for details which were reviewed in writing and the patient given a copy.    Will try to taper gerd rx but concerned about excess throat clearing > rec more daytime use of h1 and hard rock candy  Needs med reconciliation.  To keep things simple, I have asked the patient to first separate medicines that are perceived as maintenance, that is to be taken daily "no matter what", from those medicines that are taken on only on an as-needed basis and I have given the patient examples of both, and then return to see our NP to generate a  detailed  medication calendar which should be followed until the next physician sees the patient and updates it.     See instructions for specific recommendations which were reviewed directly with the patient who was given a copy with highlighter outlining the key components.

## 2014-06-05 NOTE — Progress Notes (Signed)
Subjective:     Patient ID: Allison Gross, female   DOB: [birth year] .   MRN: [id number]   Brief patient profile:  68 yowf quit light smoking 1973 with intermittent sinus/ bronchitis but no chronic c/o's or need for rx not much better after stopped smoking then much worse after around 2000 despite daily maint rx for asthma. Previously found to be allergic to dust, cats and trees and mold but no seasonal pattern.    History of Present Illness  09/21/2013 Acute OV  Complains of hoarseness, some increased SOB, low grade temp, dry hacking cough with occasional yellow mucus, some tightness x 1 week.  She denies any calf pain, edema, hemoptysis, orthopnea, PND, nausea, vomiting, diarrhea. Does feel, that she's had a low-grade fever. Returned yesterday from a 2 week cruise.  Says many people on the cruise were sick with cough and similar symptoms. Was in [place].  Finished zpak on 6/3 and pred pak today by ships' physician. Has been using tramadol to today for cough with minimal help rec Levaquin 500mg  daily for 7 days  Prednisone taper over next week.  Delsym 2 tsp Twice daily   For cough  Tramadol 50mg  1 every 4hr as needed for breakthrough cough.  Mucinex Twice daily  As needed  Congestion . Saline nasal rinses As needed   Continue on Chlortrimeton 4mg  2 tabs At bedtime        04/26/2014 acute  ov/Baldemar Dady re: recurrent cough  Chief Complaint  Patient presents with  . Acute Visit    Pt states that her cough is no better since acute visit here on 04/22/14. Her wheezing seems to be getting worse- esp at night. She is using proair about 4 x per day, and duoneb 2-3 x per day.    10/22/13 -p xmas 2015 doing great on symb 80 2 bid , allegra daily, omeprazole 20 qam / h1 x 2 at hs but not h2, then acutely ill with productive cough / sore throat/ subj wheeze esp at hs  But fully ambulatory and note the saba not helping her "wheezing" even in neb form Tramadol x one no better / tessilon no better, hycodan  no better  rec In the the event of cough for any reason: Use the flutter valve as much as possible mucinex dm 1200 mg every 12 hours and supplement with 2 tramadol until no longer coughing And omeprazole 40 mg Take 30- 60 min before your first and last meals of the day and Pepcid 20 mg one at bedtime  For drainage take chlortrimeton (chlorpheniramine) 4 mg every 4 hours available over the counter (may cause drowsiness) Prednisone 10 mg take  4 each am x 2 days,   2 each am x 2 days,  1 each am x 2 days and stop  GERD   If not better see [name] with all meds in hand  Add needs med calendar      06/05/2014 f/u ov/Maragret Vanacker re: recurrent cough x 2000 esp since 03/2014 uri Chief Complaint  Patient presents with  . Follow-up    Pt states that her breathing has improved back to her normal baseline. No new co's today. She has not needed neb or rescue inhaler recently.   not using h1 during the day but does help the urge to clear throat.   No obvious day to day or daytime variabilty or assoc excess or purulent sputum or   cp or chest tightness   overt sinus or  hb symptoms. No unusual exp hx or h/o childhood pna/ asthma or knowledge of premature birth.   Also denies any obvious fluctuation of symptoms with weather or environmental changes or other aggravating or alleviating factors except as outlined above   Current Medications, Allergies, Complete Past Medical History, Past Surgical History, Family History, and Social History were reviewed in [employer] record.  ROS  The following are not active complaints unless bolded sore throat, dysphagia, dental problems, itching, sneezing,  nasal congestion or excess/ purulent secretions, ear ache,   fever, chills, sweats, unintended wt loss, pleuritic or exertional cp, hemoptysis,  orthopnea pnd or leg swelling, presyncope, palpitations, heartburn, abdominal pain, anorexia, nausea, vomiting, diarrhea  or change in bowel or urinary  habits, change in stools or urine, dysuria,hematuria,  rash, arthralgias, visual complaints, headache, numbness weakness or ataxia or problems with walking or coordination,  change in mood/affect or memory.              .     Past Medical History:  Asthma  - HFA 50% November 10, 2009 > 75% December 24, 2009 > 90% March 26, 2010  Osteopenia  - hold fosfamax 09/2009 >> better with active HB so rec Reclast rx         Objective:   Physical Exam   07/14/2012   158  > 10/16/2012 158 > 01/03/2013 157  > 06/21/2013 164 >159 09/21/2013 > 10/22/2013 159 > 04/26/2014 160  > 06/05/2014 158   GEN: A/Ox3; pleasant , NAD, elderly wf  Still occ throat clearing   HEENT:  [place]/AT,  EACs-clear, TMs-wnl, NOSE-clear drainage  drainage THROAT-clear, no lesions,   No excess pnd   NECK:  Supple w/ fair ROM; no JVD; normal carotid impulses w/o bruits; no thyromegaly or nodules palpated; no lymphadenopathy.  RESP  CTA ,   no accessory muscle use, no dullness to percussion, no wheeze or cough on exp   CARD:  RRR, no m/r/g  , no peripheral edema, pulses intact, no cyanosis or clubbing.  GI:   Soft & nt; nml bowel sounds; no organomegaly or masses detected.  Musco: Warm bil, no deformities or joint swelling noted.     CXR PA and Lateral:   04/26/2014 :  No active cardiopulmonary disease.    Assessment:

## 2014-07-11 ENCOUNTER — Encounter: Payer: Self-pay | Admitting: Adult Health

## 2014-07-11 ENCOUNTER — Ambulatory Visit (INDEPENDENT_AMBULATORY_CARE_PROVIDER_SITE_OTHER): Payer: BLUE CROSS/BLUE SHIELD | Admitting: Adult Health

## 2014-07-11 VITALS — BP 130/80 | HR 86 | Temp 97.8°F | Ht 62.0 in | Wt 161.8 lb

## 2014-07-11 DIAGNOSIS — R05 Cough: Secondary | ICD-10-CM

## 2014-07-11 DIAGNOSIS — J4530 Mild persistent asthma, uncomplicated: Secondary | ICD-10-CM | POA: Diagnosis not present

## 2014-07-11 DIAGNOSIS — R058 Other specified cough: Secondary | ICD-10-CM

## 2014-07-11 NOTE — Patient Instructions (Signed)
May change Chlrotrimeton to As needed  Use only.  Follow med calendar closely and bring to each visit.  Follow up Dr. Melvyn Novas  In 3 months and As needed

## 2014-07-11 NOTE — Assessment & Plan Note (Signed)
Doing well   Plan  may change Chlrotrimeton to As needed  Use only.  Follow med calendar closely and bring to each visit.  Follow up Dr. Melvyn Novas  In 3 months and As needed

## 2014-07-11 NOTE — Progress Notes (Signed)
Subjective:     Patient ID: Allison Gross, female   DOB: Jan 09, 1946 .   MRN: [id number]   Brief patient profile:  68 yowf quit light smoking 1973 with intermittent sinus/ bronchitis but no chronic c/o's or need for rx not much better after stopped smoking then much worse after around 2000 despite daily maint rx for asthma. Previously found to be allergic to dust, cats and trees and mold but no seasonal pattern.    History of Present Illness  09/21/2013 Acute OV  Complains of hoarseness, some increased SOB, low grade temp, dry hacking cough with occasional yellow mucus, some tightness x 1 week.  She denies any calf pain, edema, hemoptysis, orthopnea, PND, nausea, vomiting, diarrhea. Does feel, that she's had a low-grade fever. Returned yesterday from a 2 week cruise.  Says many people on the cruise were sick with cough and similar symptoms. Was in [place].  Finished zpak on 6/3 and pred pak today by ships' physician. Has been using tramadol to today for cough with minimal help rec Levaquin 500mg  daily for 7 days  Prednisone taper over next week.  Delsym 2 tsp Twice daily   For cough  Tramadol 50mg  1 every 4hr as needed for breakthrough cough.  Mucinex Twice daily  As needed  Congestion . Saline nasal rinses As needed   Continue on Chlortrimeton 4mg  2 tabs At bedtime        04/26/2014 acute  ov/Wert re: recurrent cough  Chief Complaint  Patient presents with  . Acute Visit    Pt states that her cough is no better since acute visit here on 04/22/14. Her wheezing seems to be getting worse- esp at night. She is using proair about 4 x per day, and duoneb 2-3 x per day.    10/22/13 -p xmas 2015 doing great on symb 80 2 bid , allegra daily, omeprazole 20 qam / h1 x 2 at hs but not h2, then acutely ill with productive cough / sore throat/ subj wheeze esp at hs  But fully ambulatory and note the saba not helping her "wheezing" even in neb form Tramadol x one no better / tessilon no better, hycodan  no better  rec In the the event of cough for any reason: Use the flutter valve as much as possible mucinex dm 1200 mg every 12 hours and supplement with 2 tramadol until no longer coughing And omeprazole 40 mg Take 30- 60 min before your first and last meals of the day and Pepcid 20 mg one at bedtime  For drainage take chlortrimeton (chlorpheniramine) 4 mg every 4 hours available over the counter (may cause drowsiness) Prednisone 10 mg take  4 each am x 2 days,   2 each am x 2 days,  1 each am x 2 days and stop  GERD   If not better see Grethel Zenk NP with all meds in hand  Add needs med calendar      06/05/2014 f/u ov/Wert re: recurrent cough x 2000 esp since 03/2014 uri Chief Complaint  Patient presents with  . Follow-up    Pt states that her breathing has improved back to her normal baseline. No new co's today. She has not needed neb or rescue inhaler recently.   not using h1 during the day but does help the urge to clear throat.  07/11/2014 Follow up and med review  MW pt here for new med calendar- Pt brougt all meds with her today.  reviwed and organized into med  calendar with pt education .  Breathing is doing well with no new complaints. Cough is gone.  No fever chest pain or orthopena.    Current Medications, Allergies, Complete Past Medical History, Past Surgical History, Family History, and Social History were reviewed in [employer] record.  ROS  The following are not active complaints unless bolded sore throat, dysphagia, dental problems, itching, sneezing,  nasal congestion or excess/ purulent secretions, ear ache,   fever, chills, sweats, unintended wt loss, pleuritic or exertional cp, hemoptysis,  orthopnea pnd or leg swelling, presyncope, palpitations, heartburn, abdominal pain, anorexia, nausea, vomiting, diarrhea  or change in bowel or urinary habits, change in stools or urine, dysuria,hematuria,  rash, arthralgias, visual complaints, headache,  numbness weakness or ataxia or problems with walking or coordination,  change in mood/affect or memory.              .     Past Medical History:  Asthma  - HFA 50% November 10, 2009 > 75% December 24, 2009 > 90% March 26, 2010  Osteopenia  - hold fosfamax 09/2009 >> better with active HB so rec Reclast rx         Objective:   Physical Exam   07/14/2012   158  > 10/16/2012 158 > 01/03/2013 157  > 06/21/2013 164 >159 09/21/2013 > 10/22/2013 159 > 04/26/2014 160  > 06/05/2014 158 >161 07/11/2014   GEN: A/Ox3; pleasant , NAD, elderly wf,   HEENT:  [place]/AT,  EACs-clear, TMs-wnl, NOSE-clear drainage  drainage THROAT-clear, no lesions,   No excess pnd   NECK:  Supple w/ fair ROM; no JVD; normal carotid impulses w/o bruits; no thyromegaly or nodules palpated; no lymphadenopathy.  RESP  CTA ,   no accessory muscle use, no dullness to percussion, no wheeze or cough on exp   CARD:  RRR, no m/r/g  , no peripheral edema, pulses intact, no cyanosis or clubbing.  GI:   Soft & nt; nml bowel sounds; no organomegaly or masses detected.  Musco: Warm bil, no deformities or joint swelling noted.     CXR PA and Lateral:   04/26/2014 :  No active cardiopulmonary disease.    Assessment:

## 2014-07-11 NOTE — Assessment & Plan Note (Signed)
Cough is doing well   Patient's medications were reviewed today and patient education was given. Computerized medication calendar was adjusted/completed   Plan  ay change Chlrotrimeton to As needed  Use only.  Follow med calendar closely and bring to each visit.  Follow up Dr. Melvyn Novas  In 3 months and As needed

## 2014-07-24 ENCOUNTER — Telehealth: Payer: Self-pay | Admitting: Internal Medicine

## 2014-07-24 MED ORDER — PREDNISONE 10 MG PO TABS: ORAL_TABLET | ORAL | Status: DC

## 2014-07-24 NOTE — Telephone Encounter (Signed)
Spoke with pt, aware of recs.  appt scheduled for 4/27 with MW.  pred sent in.  Nothing further needed.

## 2014-07-24 NOTE — Telephone Encounter (Signed)
Prednisone 10 mg take  4 each am x 2 days,   2 each am x 2 days,  1 each am x 2 days and stop   Make sure has f/u ov in a few weeks with med cal in hand

## 2014-07-24 NOTE — Telephone Encounter (Signed)
Spoke with the pt  She is c/o "hacking allergy cough"-onset 07/21/14 Was doing great at last ov with TP 07/11/14  She states that she has been following med cal closely, using mucinex and flutter valve- sputum is clear  She is requesting pred taper  Denies any wheezing, chest tightness, SOB Please advise thanks! Allergies  Allergen Reactions  . Sulfonamide Derivatives     unknown

## 2014-07-26 NOTE — Addendum Note (Signed)
Addended by: Parke Poisson E on: 07/26/2014 01:44 PM   Modules accepted: Orders, Medications

## 2014-08-02 ENCOUNTER — Telehealth: Payer: Self-pay | Admitting: Internal Medicine

## 2014-08-02 MED ORDER — HYDROCODONE-HOMATROPINE 5-1.5 MG/5ML PO SYRP: 5.0000 mL | ORAL_SOLUTION | Freq: Four times a day (QID) | ORAL | Status: DC | PRN

## 2014-08-02 NOTE — Telephone Encounter (Signed)
lmtcb X1 to make pt aware that rx is approved and ready for pickup.

## 2014-08-02 NOTE — Telephone Encounter (Signed)
Pt returning call, let her know that rx was approved and was ready for pick-up.Allison Gross

## 2014-08-02 NOTE — Telephone Encounter (Signed)
Pt requesting a Hydromet cough syrup Rx - pt states that she tries to keep this on hand for her cough.  Pt having some cough during then night- uses about 1 tsp at night to suppress cough so that she can sleep.  Last prescribed 09/26/13  #289mL Please advise Dr Melvyn Novas. Thanks.

## 2014-08-02 NOTE — Telephone Encounter (Signed)
Fine with me

## 2014-08-14 ENCOUNTER — Other Ambulatory Visit (INDEPENDENT_AMBULATORY_CARE_PROVIDER_SITE_OTHER): Payer: BLUE CROSS/BLUE SHIELD

## 2014-08-14 ENCOUNTER — Encounter: Payer: Self-pay | Admitting: Internal Medicine

## 2014-08-14 ENCOUNTER — Ambulatory Visit (INDEPENDENT_AMBULATORY_CARE_PROVIDER_SITE_OTHER): Payer: BLUE CROSS/BLUE SHIELD | Admitting: Internal Medicine

## 2014-08-14 VITALS — BP 122/64 | HR 99 | Temp 98.1°F | Ht 62.0 in | Wt 159.0 lb

## 2014-08-14 DIAGNOSIS — R05 Cough: Secondary | ICD-10-CM

## 2014-08-14 DIAGNOSIS — R058 Other specified cough: Secondary | ICD-10-CM

## 2014-08-14 DIAGNOSIS — J4530 Mild persistent asthma, uncomplicated: Secondary | ICD-10-CM | POA: Diagnosis not present

## 2014-08-14 LAB — CBC WITH DIFFERENTIAL/PLATELET
Basophils Absolute: 0 10*3/uL (ref 0.0–0.1)
Basophils Relative: 0.6 % (ref 0.0–3.0)
EOS PCT: 1.4 % (ref 0.0–5.0)
Eosinophils Absolute: 0.1 10*3/uL (ref 0.0–0.7)
HCT: 36.5 % (ref 36.0–46.0)
Hemoglobin: 12.4 g/dL (ref 12.0–15.0)
LYMPHS PCT: 31.3 % (ref 12.0–46.0)
Lymphs Abs: 1.4 10*3/uL (ref 0.7–4.0)
MCHC: 34.1 g/dL (ref 30.0–36.0)
MCV: 85.5 fl (ref 78.0–100.0)
Monocytes Absolute: 0.4 10*3/uL (ref 0.1–1.0)
Monocytes Relative: 9.8 % (ref 3.0–12.0)
NEUTROS PCT: 56.9 % (ref 43.0–77.0)
Neutro Abs: 2.6 10*3/uL (ref 1.4–7.7)
Platelets: 301 10*3/uL (ref 150.0–400.0)
RBC: 4.27 Mil/uL (ref 3.87–5.11)
RDW: 13.3 % (ref 11.5–15.5)
WBC: 4.5 10*3/uL (ref 4.0–10.5)

## 2014-08-14 MED ORDER — PREDNISONE 10 MG PO TABS: ORAL_TABLET | ORAL | Status: DC

## 2014-08-14 NOTE — Patient Instructions (Addendum)
For drainage take chlortrimeton (chlorpheniramine) 4 mg every 4 hours available over the counter (may cause drowsiness)   If not better > Prednisone 10 mg take  4 each am x 2 days,   2 each am x 2 days,  1 each am x 2 days and stop   Please remember to go to the lab  department downstairs for your tests - we will call you with the results when they are available.  Follow the med calendar instructions   Please schedule a follow up visit in 3 months but call sooner if needed

## 2014-08-14 NOTE — Progress Notes (Signed)
Subjective:     Patient ID: Allison Gross, female   DOB: 04-15-1946 .   MRN: [id number]   Brief patient profile:  69 yowf quit light smoking 1973 with intermittent sinus/ bronchitis but no chronic c/o's or need for rx not much better after stopped smoking then much worse after around 2000 despite daily maint rx for asthma. Previously found to be allergic to dust, cats and trees and mold but no seasonal pattern.    History of Present Illness  09/21/2013 Acute OV  Complains of hoarseness, some increased SOB, low grade temp, dry hacking cough with occasional yellow mucus, some tightness x 1 week.  She denies any calf pain, edema, hemoptysis, orthopnea, PND, nausea, vomiting, diarrhea. Does feel, that she's had a low-grade fever. Returned yesterday from a 2 week cruise.  Says many people on the cruise were sick with cough and similar symptoms. Was in [place].  Finished zpak on 6/3 and pred pak today by ships' physician. Has been using tramadol to today for cough with minimal help rec Levaquin 500mg  daily for 7 days  Prednisone taper over next week.  Delsym 2 tsp Twice daily   For cough  Tramadol 50mg  1 every 4hr as needed for breakthrough cough.  Mucinex Twice daily  As needed  Congestion . Saline nasal rinses As needed   Continue on Chlortrimeton 4mg  2 tabs At bedtime      04/26/2014 acute  ov/Allison Gross re: recurrent cough  Chief Complaint  Patient presents with  . Acute Visit    Pt states that her cough is no better since acute visit here on 04/22/14. Her wheezing seems to be getting worse- esp at night. She is using proair about 4 x per day, and duoneb 2-3 x per day.    10/22/13 -p xmas 2015 doing great on symb 80 2 bid , allegra daily, omeprazole 20 qam / h1 x 2 at hs but not h2, then acutely ill with productive cough / sore throat/ subj wheeze esp at hs  But fully ambulatory and note the saba not helping her "wheezing" even in neb form Tramadol x one no better / tessilon no better, hycodan no  better  rec In the the event of cough for any reason: Use the flutter valve as much as possible mucinex dm 1200 mg every 12 hours and supplement with 2 tramadol until no longer coughing And omeprazole 40 mg Take 30- 60 min before your first and last meals of the day and Pepcid 20 mg one at bedtime  For drainage take chlortrimeton (chlorpheniramine) 4 mg every 4 hours available over the counter (may cause drowsiness) Prednisone 10 mg take  4 each am x 2 days,   2 each am x 2 days,  1 each am x 2 days and stop  GERD   If not better see Allison Gross with all meds in hand  Add needs med calendar   07/11/2014 Follow up and med review  rec change chlortrimeton to prn /new med calendar    07/24/14 flare of pnds/cough clear mucus/ rx pred x 6 / did not use chloretrimeton at all    08/14/2014 f/u ov/Allison Gross re: cough 60 % better / using med calendar well  Chief Complaint  Patient presents with  . Follow-up    allergies x 3 weeks; PND; wheezing; cough; took prednisone burst.    Main residual problem is pnds with watery mucus esp when lies down, not better on allegra qam/nasonex qam  No obvious  day to day or daytime variabilty or assoc sob or cp or chest tightness, subjective wheeze overt   hb symptoms. No unusual exp hx or h/o childhood pna/ asthma or knowledge of premature birth.  Sleeping ok without nocturnal  or early am exacerbation  of respiratory  c/o's or need for noct saba. Also denies any obvious fluctuation of symptoms with weather or environmental changes or other aggravating or alleviating factors except as outlined above   Current Medications, Allergies, Complete Past Medical History, Past Surgical History, Family History, and Social History were reviewed in [employer] [facility] record.  ROS  The following are not active complaints unless bolded sore throat, dysphagia, dental problems, itching, sneezing,  nasal congestion or excess/ purulent secretions, ear ache,   fever,  chills, sweats, unintended wt loss, pleuritic or exertional cp, hemoptysis,  orthopnea pnd or leg swelling, presyncope, palpitations, heartburn, abdominal pain, anorexia, nausea, vomiting, diarrhea  or change in bowel or urinary habits, change in stools or urine, dysuria,hematuria,  rash, arthralgias, visual complaints, headache, numbness weakness or ataxia or problems with walking or coordination,  change in mood/affect or memory.              .     Past Medical History:  Asthma  - HFA 50% November 10, 2009 > 75% December 24, 2009 > 90% March 26, 2010  Osteopenia  - hold fosfamax 09/2009 >> better with active HB so rec Reclast rx         Objective:   Physical Exam   07/14/2012   158 = baseline  Wt Readings from Last 3 Encounters:  08/14/14 159 lb (72.122 kg)  07/11/14 161 lb 12.8 oz (73.392 kg)  06/05/14 158 lb (71.668 kg)    Vital signs reviewed  GEN: A/Ox3; pleasant , NAD, elderly wf,   HEENT:  [place]/AT,  EACs-clear, TMs-wnl, NOSE-clear drainage  drainage THROAT-clear, no lesions,   No excess pnd   NECK:  Supple w/ fair ROM; no JVD; normal carotid impulses w/o bruits; no thyromegaly or nodules palpated; no lymphadenopathy.  RESP  CTA ,   no accessory muscle use,  Trace cough at end exp  CARD:  RRR, no m/r/g  , no peripheral edema, pulses intact, no cyanosis or clubbing.  GI:   Soft & nt; nml bowel sounds; no organomegaly or masses detected.  Musco: Warm bil, no deformities or joint swelling noted.     CXR PA and Lateral:   04/26/2014 :  No active cardiopulmonary disease.    Assessment:

## 2014-08-15 ENCOUNTER — Encounter: Payer: Self-pay | Admitting: Internal Medicine

## 2014-08-15 LAB — ALLERGY FULL PROFILE
Allergen, D pternoyssinus,d7: 0.97 kU/L — ABNORMAL HIGH
Aspergillus fumigatus, m3: <0.1 kU/L
Bahia Grass: <0.1 kU/L
Box Elder IgE: <0.1 kU/L
Cat Dander: 0.59 kU/L — ABNORMAL HIGH
Common Ragweed: <0.1 kU/L
Curvularia lunata: <0.1 kU/L
D. farinae: 1.09 kU/L — ABNORMAL HIGH
Dog Dander: 0.11 kU/L — ABNORMAL HIGH
Elm IgE: <0.1 kU/L
Fescue: <0.1 kU/L
G005 Rye, Perennial: <0.1 kU/L
G009 Red Top: <0.1 kU/L
Goldenrod: 0.12 kU/L — ABNORMAL HIGH
Helminthosporium halodes: <0.1 kU/L
House Dust Hollister: 0.55 kU/L — ABNORMAL HIGH
IgE (Immunoglobulin E), Serum: 129 kU/L — ABNORMAL HIGH (ref <115)
LAMB'S QUARTERS CLASS: 0.15 kU/L — AB
Oak: <0.1 kU/L
Stemphylium Botryosum: <0.1 kU/L
Sycamore Tree: <0.1 kU/L
Timothy Grass: <0.1 kU/L

## 2014-08-15 NOTE — Assessment & Plan Note (Signed)
-  HFA 90% p coaching 04/23/11 - c/o hoarseness > added spacer 12/01/2012  > improved but stopped using it  - maint on symbicort 80 2bid   All goals of chronic asthma control met including optimal function and elimination of symptoms with minimal need for rescue therapy.  Contingencies discussed in full including contacting this office immediately if not controlling the symptoms using the rule of two's.    

## 2014-08-15 NOTE — Assessment & Plan Note (Signed)
Classic Upper airway cough syndrome, so named because it's frequently impossible to sort out how much is  CR/sinusitis with freq throat clearing (which can be related to primary GERD)   vs  causing  secondary (" extra esophageal")  GERD from wide swings in gastric pressure that occur with throat clearing, often  promoting self use of mint and menthol lozenges that reduce the lower esophageal sphincter tone and exacerbate the problem further in a cyclical fashion.   These are the same pts (now being labeled as having "irritable larynx syndrome" by some cough centers) who not infrequently have a history of having failed to tolerate ace inhibitors,  dry powder inhalers or biphosphonates or report having atypical reflux symptoms that don't respond to standard doses of PPI , and are easily confused as having aecopd or asthma flares by even experienced allergists/ pulmonologists.    Clearly her flares are related to excess pnds or sense thereof and have flared with d/c h1 though that was not the intent of changing it to prn  I had an extended discussion with the patient reviewing all relevant studies completed to date and  lasting 15 to 20 minutes of a 25 minute visit on the following ongoing concerns:  1) should add back h1 q 4h anytime she senses pnds whether she's taken allegra that day or not  2) increase nasal steroids to bid dosing / consider change to dymista next  3) allergy profile now  4) contingent rx for pred x 6 days if flares on 1st gen h1 this spring  5)   Each maintenance medication was reviewed in detail including most importantly the difference between maintenance and as needed and under what circumstances the prns are to be used. This was done in the context of a medication calendar review which provided the patient with a user-friendly unambiguous mechanism for medication administration and reconciliation and provides an action plan for all active problems. It is critical that this  be shown to every doctor  for modification during the office visit if necessary so the patient can use it as a working document.

## 2014-08-16 NOTE — Progress Notes (Signed)
Quick Note:  Spoke with pt and notified of results per Dr. Wert. Pt verbalized understanding and denied any questions.  ______ 

## 2014-09-30 ENCOUNTER — Telehealth: Payer: Self-pay | Admitting: Internal Medicine

## 2014-09-30 MED ORDER — ALBUTEROL SULFATE HFA 108 (90 BASE) MCG/ACT IN AERS: 2.0000 | INHALATION_SPRAY | RESPIRATORY_TRACT | Status: DC | PRN

## 2014-09-30 NOTE — Telephone Encounter (Signed)
RX sent in. Nothing further needed 

## 2014-10-26 ENCOUNTER — Other Ambulatory Visit: Payer: Self-pay | Admitting: Internal Medicine

## 2014-11-13 ENCOUNTER — Ambulatory Visit (INDEPENDENT_AMBULATORY_CARE_PROVIDER_SITE_OTHER): Payer: BLUE CROSS/BLUE SHIELD | Admitting: Internal Medicine

## 2014-11-13 ENCOUNTER — Encounter: Payer: Self-pay | Admitting: Internal Medicine

## 2014-11-13 VITALS — BP 118/70 | HR 77 | Ht 62.0 in | Wt 162.0 lb

## 2014-11-13 DIAGNOSIS — J4530 Mild persistent asthma, uncomplicated: Secondary | ICD-10-CM | POA: Diagnosis not present

## 2014-11-13 DIAGNOSIS — R05 Cough: Secondary | ICD-10-CM | POA: Diagnosis not present

## 2014-11-13 DIAGNOSIS — R058 Other specified cough: Secondary | ICD-10-CM

## 2014-11-13 NOTE — Patient Instructions (Signed)
Stop prilosec and if start coughing try pepcid 20 mg after breakfast and if not effective go back on prilosec   If you are satisfied with your treatment plan,  let your doctor know and he/she can either refill your medications or you can return here when your prescription runs out.     If in any way you are not 100% satisfied,  please tell us.  If 100% better, tell your friends!  Pulmonary follow up is as needed

## 2014-11-13 NOTE — Progress Notes (Signed)
Subjective:     Patient ID: Allison Gross, female   DOB: 1945/04/25   MRN: [id number]   Brief patient profile:  69 yowf quit light smoking 1973 with intermittent sinus/ bronchitis but no chronic c/o's or need for rx not much better after stopped smoking then much worse after around 2000 despite daily maint rx for asthma. Previously found to be allergic to dust, cats and trees and mold but no seasonal pattern and did not feel allergy shots helped in 1990s    History of Present Illness  09/21/2013 Acute OV  Complains of hoarseness, some increased SOB, low grade temp, dry hacking cough with occasional yellow mucus, some tightness x 1 week.  She denies any calf pain, edema, hemoptysis, orthopnea, PND, nausea, vomiting, diarrhea. Does feel, that she's had a low-grade fever. Returned yesterday from a 2 week cruise.  Says many people on the cruise were sick with cough and similar symptoms. Was in [place].  Finished zpak on 6/3 and pred pak today by ships' physician. Has been using tramadol to today for cough with minimal help rec Levaquin 500mg  daily for 7 days  Prednisone taper over next week.  Delsym 2 tsp Twice daily   For cough  Tramadol 50mg  1 every 4hr as needed for breakthrough cough.  Mucinex Twice daily  As needed  Congestion . Saline nasal rinses As needed   Continue on Chlortrimeton 4mg  2 tabs At bedtime      04/26/2014 acute  ov/Allison Gross re: recurrent cough  Chief Complaint  Patient presents with  . Acute Visit    Pt states that her cough is no better since acute visit here on 04/22/14. Her wheezing seems to be getting worse- esp at night. She is using proair about 4 x per day, and duoneb 2-3 x per day.    10/22/13 -p xmas 2015 doing great on symb 80 2 bid , allegra daily, omeprazole 20 qam / h1 x 2 at hs but not h2, then acutely ill with productive cough / sore throat/ subj wheeze esp at hs  But fully ambulatory and note the saba not helping her "wheezing" even in neb form Tramadol x  one no better / tessilon no better, hycodan no better  rec In the the event of cough for any reason: Use the flutter valve as much as possible mucinex dm 1200 mg every 12 hours and supplement with 2 tramadol until no longer coughing And omeprazole 40 mg Take 30- 60 min before your first and last meals of the day and Pepcid 20 mg one at bedtime  For drainage take chlortrimeton (chlorpheniramine) 4 mg every 4 hours available over the counter (may cause drowsiness) Prednisone 10 mg take  4 each am x 2 days,   2 each am x 2 days,  1 each am x 2 days and stop  GERD   If not better see Allison Gross with all meds in hand  Add needs med calendar   07/11/2014 Follow up and med review  rec change chlortrimeton to prn /new med calendar    07/24/14 flare of pnds/cough clear mucus/ rx pred x 6 / did not use chloretrimeton at all    08/14/2014 f/u ov/Allison Gross re: cough 60 % better / using med calendar well  Chief Complaint  Patient presents with  . Follow-up    allergies x 3 weeks; PND; wheezing; cough; took prednisone burst.    Main residual problem is pnds with watery mucus esp when lies down, not  better on allegra qam/nasonex qam rec For drainage take chlortrimeton (chlorpheniramine) 4 mg every 4 hours available over the counter (may cause drowsiness)  If not better > Prednisone 10 mg take  4 each am x 2 days,   2 each am x 2 days,  1 each am x 2 days and stop  Please remember to go to the lab  department downstairs for your tests - we will call you with the results when they are available. Follow the med calendar instructions     11/13/2014 f/u ov/Allison Gross re: chronic cough c/w cough variant asthma on symbiocrt 80 2bid and gerd rx   Chief Complaint  Patient presents with  . Follow-up    Pt states her cough has resolved.     No longer needing any cough meds/ no saba need either  No obvious day to day or daytime variabilty or assoc sob or cp or chest tightness, subjective wheeze overt   hb symptoms. No  unusual exp hx or h/o childhood pna/ asthma or knowledge of premature birth.  Sleeping ok without nocturnal  or early am exacerbation  of respiratory  c/o's or need for noct saba. Also denies any obvious fluctuation of symptoms with weather or environmental changes or other aggravating or alleviating factors except as outlined above   Current Medications, Allergies, Complete Past Medical History, Past Surgical History, Family History, and Social History were reviewed in [employer] [facility] record.  ROS  The following are not active complaints unless bolded sore throat, dysphagia, dental problems, itching, sneezing,  nasal congestion or excess/ purulent secretions, ear ache,   fever, chills, sweats, unintended wt loss, pleuritic or exertional cp, hemoptysis,  orthopnea pnd or leg swelling, presyncope, palpitations, heartburn, abdominal pain, anorexia, nausea, vomiting, diarrhea  or change in bowel or urinary habits, change in stools or urine, dysuria,hematuria,  rash, arthralgias, visual complaints, headache, numbness weakness or ataxia or problems with walking or coordination,  change in mood/affect or memory.              .     Past Medical History:  Asthma  - HFA 50% November 10, 2009 > 75% December 24, 2009 > 90% March 26, 2010  Osteopenia  - hold fosfamax 09/2009 >> better with active HB so rec Reclast rx         Objective:   Physical Exam   07/14/2012   158 = baseline /   11/13/2014 162  Wt Readings from Last 3 Encounters:  08/14/14 159 lb (72.122 kg)  07/11/14 161 lb 12.8 oz (73.392 kg)  06/05/14 158 lb (71.668 kg)    Vital signs reviewed  GEN: A/Ox3; pleasant , NAD, elderly wf,   HEENT:  [place]/AT,  EACs-clear, TMs-wnl, NOSE-clear drainage  drainage THROAT-clear, no lesions,   No excess pnd   NECK:  Supple w/ fair ROM; no JVD; normal carotid impulses w/o bruits; no thyromegaly or nodules palpated; no lymphadenopathy.  RESP  CTA ,   no accessory muscle use,      CARD:  RRR, no m/r/g  , no peripheral edema, pulses intact, no cyanosis or clubbing.  GI:   Soft & nt; nml bowel sounds; no organomegaly or masses detected.  Musco: Warm bil, no deformities or joint swelling noted.     CXR PA and Lateral:   04/26/2014 :  No active cardiopulmonary disease.    Assessment:     Outpatient Encounter Prescriptions as of 11/13/2014  Medication Sig  . albuterol (PROVENTIL HFA;VENTOLIN  HFA) 108 (90 BASE) MCG/ACT inhaler Inhale 2 puffs into the lungs every 4 (four) hours as needed for wheezing or shortness of breath (((PLAN B))).  . calcium-vitamin D (OSCAL WITH D) 500-200 MG-UNIT per tablet Take 1 tablet by mouth daily.    . carbamazepine (TEGRETOL XR) 200 MG 12 hr tablet Take 200 mg by mouth 2 (two) times daily.    . chlorpheniramine (CHLOR-TRIMETON) 4 MG tablet Take 4 mg by mouth every 4 (four) hours as needed (drippy nose, drainage, throat clearing).   Marland Kitchen dextromethorphan-guaiFENesin (MUCINEX DM) 30-600 MG per 12 hr tablet Take 1 tablet by mouth 2 (two) times daily as needed (w/ flutter).   . famotidine (PEPCID) 20 MG tablet Take 20 mg by mouth at bedtime.  . fexofenadine (ALLEGRA) 180 MG tablet Take 180 mg by mouth daily.    Marland Kitchen HYDROcodone-homatropine (HYDROMET) 5-1.5 MG/5ML syrup Take 5 mLs by mouth every 6 (six) hours as needed for cough.  Marland Kitchen ibuprofen (ADVIL,MOTRIN) 400 MG tablet Take 400 mg by mouth every 8 (eight) hours as needed.  Marland Kitchen ipratropium-albuterol (DUONEB) 0.5-2.5 (3) MG/3ML SOLN Take 3 mLs by nebulization every 4 (four) hours as needed (((PLAN B))).   Marland Kitchen losartan-hydrochlorothiazide (HYZAAR) 50-12.5 MG per tablet Take 1 tablet by mouth daily.  . Melatonin 3 MG TABS Take 1 tablet by mouth at bedtime.  . mometasone (NASONEX) 50 MCG/ACT nasal spray USE TWO SPRAYS IN EACH NOSTRIL EVERY DAY.  . Multiple Vitamin (MULTIVITAMIN) tablet Take 1 tablet by mouth daily.    Marland Kitchen omeprazole (PRILOSEC) 20 MG capsule Take 20 mg by mouth daily.    . potassium chloride  (K-DUR) 10 MEQ tablet Take 1 tablet by mouth daily. ((2 tabs on Mondays and Fridays))  . Respiratory Therapy Supplies (FLUTTER) DEVI As instructed  . SYMBICORT 80-4.5 MCG/ACT inhaler INHALE 2 PUFFS FIRST THING IN THE MORNING AND ANOTHER 2 PUFFS ABOUT 12 HOURS LATER  . traMADol (ULTRAM) 50 MG tablet 1-2 every 4 hours as needed for cough or pain  . Turmeric 500 MG CAPS Take 1 capsule by mouth daily.  . vitamin B-12 (CYANOCOBALAMIN) 500 MCG tablet Take 500 mcg by mouth daily.    . vitamin C (ASCORBIC ACID) 500 MG tablet Take 500 mg by mouth daily.    . [DISCONTINUED] predniSONE (DELTASONE) 10 MG tablet Take  4 each am x 2 days,   2 each am x 2 days,  1 each am x 2 days and stop   No facility-administered encounter medications on file as of 11/13/2014.

## 2014-11-18 ENCOUNTER — Encounter: Payer: Self-pay | Admitting: Internal Medicine

## 2014-11-18 NOTE — Assessment & Plan Note (Signed)
-   allergy profile 08/14/2014 >  IgE  129 with pos RAST Dust >  Cat > dog  Reviewed avoidance/ approp use of h1s prn

## 2014-11-18 NOTE — Assessment & Plan Note (Addendum)
-  HFA 90% p coaching 04/23/11 - c/o hoarseness > added spacer 12/01/2012  > improved but stopped using it  - maint on symbicort 80 2bid  - try taper off gerd rx 11/13/14    All goals of chronic asthma control met including optimal function and elimination of symptoms with minimal need for rescue therapy.  Contingencies discussed in full including contacting this office immediately if not controlling the symptoms using the rule of two's.     I had an extended final summary discussion with the patient reviewing all relevant studies completed to date and  lasting 10 minutes of a 15 minute visit on the following issues:    Each maintenance medication was reviewed in detail including most importantly the difference between maintenance and as needed and under what circumstances the prns are to be used.  Please see instructions for details which were reviewed in writing and the patient given a copy.

## 2014-12-31 ENCOUNTER — Telehealth: Payer: Self-pay | Admitting: Internal Medicine

## 2014-12-31 MED ORDER — TRAMADOL HCL 50 MG PO TABS: ORAL_TABLET | ORAL | Status: DC

## 2014-12-31 NOTE — Telephone Encounter (Signed)
RX has been called into the pharm.  Called pt and made aware. Nothing further needed

## 2014-12-31 NOTE — Telephone Encounter (Signed)
Per 11/13/14: Patient Instructions       Stop prilosec and if start coughing try pepcid 20 mg after breakfast and if not effective go back on prilosec  If you are satisfied with your treatment plan,  let your doctor know and he/she can either refill your medications or you can return here when your prescription runs out.    If in any way you are not 100% satisfied,  please tell us.  If 100% better, tell your friends! Pulmonary follow up is as needed  --  Called spoke with pt. She reports MW told her if she starts coughing at all to restart the tramadol. She has dry cough and wants refill on tramadol. She has already taken 1 week of prednisone.  Please advise MW thanks

## 2014-12-31 NOTE — Telephone Encounter (Signed)
Ok  To refill same number as before

## 2015-01-31 ENCOUNTER — Telehealth: Payer: Self-pay | Admitting: Internal Medicine

## 2015-01-31 MED ORDER — PREDNISONE 10 MG PO TABS: ORAL_TABLET | ORAL | Status: DC

## 2015-01-31 MED ORDER — AZITHROMYCIN 250 MG PO TABS: ORAL_TABLET | ORAL | Status: AC

## 2015-01-31 NOTE — Telephone Encounter (Signed)
Spoke with pt. States that she has developed a productive cough. Reports SOB, chest tightness and wheezing. Cough is producing green mucus. Has been taking all the medications that MW recommended she take. Would like a pred taper to be sent in.  MW - please advise. Thanks.

## 2015-01-31 NOTE — Telephone Encounter (Signed)
I would also rec zpak  Prednisone 10 mg take  4 each am x 2 days,   2 each am x 2 days,  1 each am x 2 days and stop   Be sure has ov in 2 weeks to regroup

## 2015-01-31 NOTE — Telephone Encounter (Signed)
Pt is aware of MW's recommendation. Rx's have been sent in. OV has been scheduled for 02/14/15 at 11:15am. Nothing further was needed.

## 2015-02-05 ENCOUNTER — Other Ambulatory Visit: Payer: Self-pay | Admitting: Internal Medicine

## 2015-02-14 ENCOUNTER — Ambulatory Visit (INDEPENDENT_AMBULATORY_CARE_PROVIDER_SITE_OTHER): Payer: BLUE CROSS/BLUE SHIELD | Admitting: Internal Medicine

## 2015-02-14 ENCOUNTER — Encounter: Payer: Self-pay | Admitting: Internal Medicine

## 2015-02-14 VITALS — BP 126/74 | HR 88 | Ht 62.0 in | Wt 159.4 lb

## 2015-02-14 DIAGNOSIS — R058 Other specified cough: Secondary | ICD-10-CM

## 2015-02-14 DIAGNOSIS — R05 Cough: Secondary | ICD-10-CM | POA: Diagnosis not present

## 2015-02-14 DIAGNOSIS — J45991 Cough variant asthma: Secondary | ICD-10-CM | POA: Diagnosis not present

## 2015-02-14 MED ORDER — TRAMADOL HCL 50 MG PO TABS: ORAL_TABLET | ORAL | Status: DC

## 2015-02-14 MED ORDER — PREDNISONE 10 MG PO TABS: ORAL_TABLET | ORAL | Status: DC

## 2015-02-14 NOTE — Progress Notes (Signed)
Subjective:     Patient ID: Allison Gross, female   DOB: May 05, 1945   MRN: [id number]   Brief patient profile:  69 yowf quit light smoking 1973 with intermittent sinus/ bronchitis but no chronic c/o's or need for rx not much better after stopped smoking then much worse after around 2000 despite daily maint rx for asthma. Previously found to be allergic to dust, cats and trees and mold but no seasonal pattern and did not feel allergy shots helped in 1990s    History of Present Illness  09/21/2013 Acute OV  Complains of hoarseness, some increased SOB, low grade temp, dry hacking cough with occasional yellow mucus, some tightness x 1 week.  She denies any calf pain, edema, hemoptysis, orthopnea, PND, nausea, vomiting, diarrhea. Does feel, that she's had a low-grade fever. Returned yesterday from a 2 week cruise.  Says many people on the cruise were sick with cough and similar symptoms. Was in [place].  Finished zpak on 6/3 and pred pak today by ships' physician. Has been using tramadol to today for cough with minimal help rec Levaquin 500mg  daily for 7 days  Prednisone taper over next week.  Delsym 2 tsp Twice daily   For cough  Tramadol 50mg  1 every 4hr as needed for breakthrough cough.  Mucinex Twice daily  As needed  Congestion . Saline nasal rinses As needed   Continue on Chlortrimeton 4mg  2 tabs At bedtime      04/26/2014 acute  ov/Kamayah Pillay re: recurrent cough  Chief Complaint  Patient presents with  . Acute Visit    Pt states that her cough is no better since acute visit here on 04/22/14. Her wheezing seems to be getting worse- esp at night. She is using proair about 4 x per day, and duoneb 2-3 x per day.    10/22/13 -p xmas 2015 doing great on symb 80 2 bid , allegra daily, omeprazole 20 qam / h1 x 2 at hs but not h2, then acutely ill with productive cough / sore throat/ subj wheeze esp at hs  But fully ambulatory and note the saba not helping her "wheezing" even in neb form Tramadol x  one no better / tessilon no better, hycodan no better  rec In the the event of cough for any reason: Use the flutter valve as much as possible mucinex dm 1200 mg every 12 hours and supplement with 2 tramadol until no longer coughing And omeprazole 40 mg Take 30- 60 min before your first and last meals of the day and Pepcid 20 mg one at bedtime  For drainage take chlortrimeton (chlorpheniramine) 4 mg every 4 hours available over the counter (may cause drowsiness) Prednisone 10 mg take  4 each am x 2 days,   2 each am x 2 days,  1 each am x 2 days and stop  GERD   If not better see [name] with all meds in hand  Add needs med calendar   07/11/2014 Follow up and med review  rec change chlortrimeton to prn /new med calendar    07/24/14 flare of pnds/cough clear mucus/ rx pred x 6 / did not use chloretrimeton at all    08/14/2014 f/u ov/Brianca Fortenberry re: cough 60 % better / using med calendar well  Chief Complaint  Patient presents with  . Follow-up    allergies x 3 weeks; PND; wheezing; cough; took prednisone burst.    Main residual problem is pnds with watery mucus esp when lies down, not  better on allegra qam/nasonex qam rec For drainage take chlortrimeton (chlorpheniramine) 4 mg every 4 hours available over the counter (may cause drowsiness)  If not better > Prednisone 10 mg take  4 each am x 2 days,   2 each am x 2 days,  1 each am x 2 days and stop  Please remember to go to the lab  department downstairs for your tests - we will call you with the results when they are available. Follow the med calendar instructions     11/13/2014 f/u ov/Etsuko Dierolf re: chronic cough c/w cough variant asthma on symbiocrt 80 2bid and gerd rx   Chief Complaint  Patient presents with  . Follow-up    Pt states her cough has resolved.    No longer needing any cough meds/ no saba need either  rec Stop prilosec and if start coughing try pepcid 20 mg after breakfast and if not effective go back on  prilosec    02/14/2015  Acute  ov/Verna Desrocher re: recurrent cough x 16 y Chief Complaint  Patient presents with  . Follow-up    Pt c/o cough for the past 3 wks- had been prod with green sputum but now clear after zithromax. Cough is worse at night- chlorpheniramine helps.   had some problem with ragweed late Aug 2016 made her cough but resolved  a week later then early October bad cough/ greeen mucus gone p zpak Started back on prilosec with ragweed but concernd about safety issues/ only 20 mg daily  Cough all day but esp worse at hs  h1 works well but doesn't use often and never more than one at hs  No need for saba in any form     No obvious day to day or daytime variabilty or assoc sob or cp or chest tightness, subjective wheeze overt   hb symptoms. No unusual exp hx or h/o childhood pna/ asthma or knowledge of premature birth.  Sleeping ok p gets to sleep without nocturnal  or early am exacerbation  of respiratory  c/o's or need for noct saba. Also denies any obvious fluctuation of symptoms with weather or environmental changes or other aggravating or alleviating factors except as outlined above   Current Medications, Allergies, Complete Past Medical History, Past Surgical History, Family History, and Social History were reviewed in [employer] record.  ROS  The following are not active complaints unless bolded sore throat, dysphagia, dental problems, itching, sneezing,  nasal congestion or excess/ purulent secretions, ear ache,   fever, chills, sweats, unintended wt loss, pleuritic or exertional cp, hemoptysis,  orthopnea pnd or leg swelling, presyncope, palpitations, heartburn, abdominal pain, anorexia, nausea, vomiting, diarrhea  or change in bowel or urinary habits, change in stools or urine, dysuria,hematuria,  rash, arthralgias, visual complaints, headache, numbness weakness or ataxia or problems with walking or coordination,  change in mood/affect or memory.               .     Past Medical History:  Asthma  - HFA 50% November 10, 2009 > 75% December 24, 2009 > 90% March 26, 2010  Osteopenia  - hold fosfamax 09/2009 >> better with active HB so rec Reclast rx         Objective:   Physical Exam   07/14/2012   158 = baseline /   11/13/2014 162 > 02/14/2015   159  Wt Readings from Last 3 Encounters:  08/14/14 159 lb (72.122 kg)  07/11/14 161 lb  12.8 oz (73.392 kg)  06/05/14 158 lb (71.668 kg)    Vital signs reviewed  GEN: A/Ox3; pleasant , NAD, elderly wf,   HEENT:  [place]/AT,  EACs-clear, TMs-wnl, NOSE-clear drainage  drainage THROAT-clear, no lesions,   No excess pnd   NECK:  Supple w/ fair ROM; no JVD; normal carotid impulses w/o bruits; no thyromegaly or nodules palpated; no lymphadenopathy.  RESP  CTA ,   no accessory muscle use,     CARD:  RRR, no m/r/g  , no peripheral edema, pulses intact, no cyanosis or clubbing.  GI:   Soft & nt; nml bowel sounds; no organomegaly or masses detected.  Musco: Warm bil, no deformities or joint swelling noted.     CXR PA and Lateral:   04/26/2014 :  No active cardiopulmonary disease.    Assessment:

## 2015-02-14 NOTE — Patient Instructions (Addendum)
Increase chlortrimeton to where you take 4 mg x 2 at bedtime along with the pepcid 20 mg   GERD (REFLUX)  is an extremely common cause of respiratory symptoms just like yours , many times with no obvious heartburn at all.    It can be treated with medication, but also with lifestyle changes including elevation of the head of your bed (ideally with 6 inch  bed blocks),  Smoking cessation, avoidance of late meals, excessive alcohol, and avoid fatty foods, chocolate, peppermint, colas, red wine, and acidic juices such as orange juice.  NO MINT OR MENTHOL PRODUCTS SO NO COUGH DROPS  USE SUGARLESS CANDY INSTEAD (Jolley ranchers or Stover's or Life Savers) or even ice chips will also do - the key is to swallow to prevent all throat clearing. NO OIL BASED VITAMINS - use powdered substitutes.  Take delsym two tsp every 12 hours and supplement if needed with  tramadol 50 mg up to 2 every 4 hours to suppress the urge to cough. Swallowing water or using ice chips/non mint and menthol containing candies (such as lifesavers or sugarless jolly ranchers) are also effective.  You should rest your voice and avoid activities that you know make you cough.  Once you have eliminated the cough for 3 straight days try reducing the tramadol first,  then the delsym as tolerated.    Prednisone 10 mg take  4 each am x 2 days,   2 each am x 2 days,  1 each am x 2 days and stop   Late add Next ov be sure using med calendar/ ? Ever tried Therapist, sports

## 2015-02-15 ENCOUNTER — Encounter: Payer: Self-pay | Admitting: Internal Medicine

## 2015-02-15 NOTE — Assessment & Plan Note (Signed)
-  HFA 90% p coaching 04/23/11 - c/o hoarseness > added spacer 12/01/2012  > improved but stopped using it  - maint on symbicort 80 2bid  - try taper off gerd rx 11/13/14 > flared end of August 2016     Mild flare with uacs/ cyclical cough > see uacs  In terms of the asthma, All goals of chronic asthma control met including optimal function and elimination of symptoms with minimal need for rescue therapy.  Contingencies discussed in full including contacting this office immediately if not controlling the symptoms using the rule of two's.

## 2015-02-15 NOTE — Assessment & Plan Note (Addendum)
-   HFA 90% p coaching 04/23/11 - c/o hoarseness > added spacer 12/01/2012  > improved but stopped using it  - maint on symbicort 80 2bid  - try taper off gerd rx 11/13/14 > flared end of August 2016      Note she is really not allergic to ragweed so it may well be that this cough was not induced by allergy but by the lack of adequate treatment for reflux or uri.  Explained the natural history of uri and why it's necessary in patients at risk to treat GERD aggressively - at least  short term -   to reduce risk of evolving cyclical cough initially  triggered by epithelial injury and a heightened sensitivty to the effects of any upper airway irritants,  most importantly acid - related - then perpetuated by epithelial injury related to the cough itself as the upper airway collapses on itself.  That is, the more sensitive the epithelium becomes once it is damaged by the virus, the more the ensuing irritability> the more the cough, the more the secondary reflux (especially in those prone to reflux) the more the irritation of the sensitive mucosa and so on in a  Classic cyclical pattern.    For now rec max gerd rx/ elimination of cyclical cough then regroup if not better in 2 weeks  I had an extended discussion with the patient reviewing all relevant studies completed to date and  lasting 15 to 20 minutes of a 25 minute visit    1) Discussed the recent press about ppi's in the context of a statistically significant (but questionably clinically relevant) increase in CRI in pts on ppi vs h2's > bottom line is the lowest dose of ppi that controls   gerd is the right dose and if that dose is zero that's fine esp since h2's are cheaper.  For now needs max rx however since actively coughing      2) Each maintenance medication was reviewed in detail including most importantly the difference between maintenance and prns and under what circumstances the prns are to be triggered using an action plan format that is not  reflected in the computer generated alphabetically organized AVS but trather by a customized med calendar that reflects the AVS meds with confirmed 100% correlation.   Please see instructions for details which were reviewed in writing and the patient given a copy highlighting the part that I personally wrote and discussed at today's ov.

## 2015-03-10 ENCOUNTER — Other Ambulatory Visit: Payer: Self-pay | Admitting: *Deleted

## 2015-03-10 MED ORDER — BUDESONIDE-FORMOTEROL FUMARATE 80-4.5 MCG/ACT IN AERO: INHALATION_SPRAY | RESPIRATORY_TRACT | Status: DC

## 2015-04-16 ENCOUNTER — Telehealth: Payer: Self-pay | Admitting: Internal Medicine

## 2015-04-16 MED ORDER — PREDNISONE 10 MG PO TABS: ORAL_TABLET | ORAL | Status: DC

## 2015-04-16 NOTE — Telephone Encounter (Signed)
Called spoke with pt. Aware of recs. RX sent into the pharmacy. Nothing further needed

## 2015-04-16 NOTE — Telephone Encounter (Signed)
Spoke with pt, c/o prod cough with clear mucus X3 days-denies fever/chest pain/sinus congestion.  Pt requesting pred taper.   Pt uses [facility] in [place].  MW please advise on recs.  Thanks!

## 2015-04-16 NOTE — Telephone Encounter (Signed)
Prednisone 10 mg take  4 each am x 2 days,   2 each am x 2 days,  1 each am x 2 days and stop  

## 2015-05-20 ENCOUNTER — Telehealth: Payer: Self-pay | Admitting: Internal Medicine

## 2015-05-20 NOTE — Telephone Encounter (Signed)
Per 02/14/15 OV: Patient Instructions       Increase chlortrimeton to where you take 4 mg x 2 at bedtime along with the pepcid 20 mg   GERD (REFLUX)  is an extremely common cause of respiratory symptoms just like yours , many times with no obvious heartburn at all.    It can be treated with medication, but also with lifestyle changes including elevation of the head of your bed (ideally with 6 inch  bed blocks),  Smoking cessation, avoidance of late meals, excessive alcohol, and avoid fatty foods, chocolate, peppermint, colas, red wine, and acidic juices such as orange juice.   NO MINT OR MENTHOL PRODUCTS SO NO COUGH DROPS  USE SUGARLESS CANDY INSTEAD (Jolley ranchers or Stover's or Life Savers) or even ice chips will also do - the key is to swallow to prevent all throat clearing. NO OIL BASED VITAMINS - use powdered substitutes.  Take delsym two tsp every 12 hours and supplement if needed with  tramadol 50 mg up to 2 every 4 hours to suppress the urge to cough. Swallowing water or using ice chips/non mint and menthol containing candies (such as lifesavers or sugarless jolly ranchers) are also effective.  You should rest your voice and avoid activities that you know make you cough.  Once you have eliminated the cough for 3 straight days try reducing the tramadol first,  then the delsym as tolerated.    Prednisone 10 mg take  4 each am x 2 days,   2 each am x 2 days,  1 each am x 2 days and stop   Late add Next ov be sure using med calendar/ ? Ever tried singulair   ----  Pt has no pending appt. Called spoke with pt. appt scheduled to see MW tomorrow at 1:30 for acute visit. Nothing further needed

## 2015-05-21 ENCOUNTER — Encounter: Payer: Self-pay | Admitting: Internal Medicine

## 2015-05-21 ENCOUNTER — Ambulatory Visit (INDEPENDENT_AMBULATORY_CARE_PROVIDER_SITE_OTHER): Payer: BLUE CROSS/BLUE SHIELD | Admitting: Internal Medicine

## 2015-05-21 VITALS — BP 146/88 | HR 88 | Ht 62.0 in | Wt 161.4 lb

## 2015-05-21 DIAGNOSIS — J45991 Cough variant asthma: Secondary | ICD-10-CM | POA: Diagnosis not present

## 2015-05-21 DIAGNOSIS — R05 Cough: Secondary | ICD-10-CM

## 2015-05-21 DIAGNOSIS — R058 Other specified cough: Secondary | ICD-10-CM

## 2015-05-21 LAB — NITRIC OXIDE: Nitric Oxide: 5

## 2015-05-21 MED ORDER — TRAMADOL HCL 50 MG PO TABS: ORAL_TABLET | ORAL | Status: DC

## 2015-05-21 MED ORDER — PREDNISONE 10 MG PO TABS: ORAL_TABLET | ORAL | Status: DC

## 2015-05-21 NOTE — Assessment & Plan Note (Addendum)
-   HFA 90% p coaching 04/23/11 - c/o hoarseness > added spacer 12/01/2012  > improved but stopped using it  - maint on symbicort 80 2bid  - try taper off gerd rx 11/13/14 > flared end of August 2016  - NO 05/21/2015  = 5    Flare of cough s allergic features and supported by a very low NO this is more than likely a flare of cyclical cough prob brought on by viral uri picked up a funeral.  Explained the natural history of uri and why it's necessary in patients at risk to treat GERD aggressively - at least  short term -   to reduce risk of evolving cyclical cough initially  triggered by epithelial injury and a heightened sensitivty to the effects of any upper airway irritants,  most importantly acid - related - then perpetuated by epithelial injury related to the cough itself as the upper airway collapses on itself.  That is, the more sensitive the epithelium becomes once it is damaged by the virus, the more the ensuing irritability> the more the cough, the more the secondary reflux (especially in those prone to reflux) the more the irritation of the sensitive mucosa and so on in a  Classic cyclical pattern.    I had an extended discussion with the patient reviewing all relevant studies completed to date and  lasting 15 to 20 minutes of a 25 minute visit   No need to change maint rx unless control of acute cyclical cough flares  Each maintenance medication was reviewed in detail including most importantly the difference between maintenance and prns and under what circumstances the prns are to be triggered using an action plan format that is not reflected in the computer generated alphabetically organized AVS.    Please see instructions for details which were reviewed in writing and the patient given a copy highlighting the part that I personally wrote and discussed at today's ov.

## 2015-05-21 NOTE — Patient Instructions (Addendum)
Prednisone 10 mg take  4 each am x 2 days,   2 each am x 2 days,  1 each am x 2 days and stop   Add pepcid 20 mg at bedtime whenever coughing and cough into the flutter valve   Take mucinex dm 1200 12 hours and supplement if needed with  tramadol 50 mg up to 1- 2 every 4 hours to suppress the urge to cough. Swallowing water or using ice chips/non mint and menthol containing candies (such as lifesavers or sugarless jolly ranchers) are also effective.  You should rest your voice and avoid activities that you know make you cough.  Once you have eliminated the cough for 3 straight days try reducing the tramadol first,  then the delsym as tolerated.    If not better return with all your medications and your medication calendar to see me or [name]

## 2015-05-21 NOTE — Assessment & Plan Note (Addendum)
Low NO favors uacs stronlgy over allergic asthma flare  - this  fits with clinical picture nicely   Explained the natural history of uri(may have caught viral infection at funeral or strained her voice or both)and why it's necessary in patients at risk to treat GERD aggressively - at least  short term -   to reduce risk of evolving cyclical cough initially  triggered by epithelial injury and a heightened sensitivty to the effects of any upper airway irritants,  most importantly acid - related - then perpetuated by epithelial injury related to the cough itself as the upper airway collapses on itself.  That is, the more sensitive the epithelium becomes once it is damaged by the virus, the more the ensuing irritability> the more the cough, the more the secondary reflux (especially in those prone to reflux) the more the irritation of the sensitive mucosa and so on in a  Classic cyclical pattern.

## 2015-05-21 NOTE — Progress Notes (Signed)
Subjective:     Patient ID: Allison Gross, female   DOB: June 08, 1945   MRN: [id number]   Brief patient profile:  69 yowf quit light smoking 1973 with intermittent sinus/ bronchitis but no chronic c/o's or need for rx not much better after stopped smoking then much worse after around 2000 despite daily maint rx for asthma. Previously found to be allergic to dust, cats and trees and mold but no seasonal pattern and did not feel allergy shots helped in 1990s    History of Present Illness  09/21/2013 Acute OV  Complains of hoarseness, some increased SOB, low grade temp, dry hacking cough with occasional yellow mucus, some tightness x 1 week.  She denies any calf pain, edema, hemoptysis, orthopnea, PND, nausea, vomiting, diarrhea. Does feel, that she's had a low-grade fever. Returned yesterday from a 2 week cruise.  Says many people on the cruise were sick with cough and similar symptoms. Was in [place].  Finished zpak on 6/3 and pred pak today by ships' physician. Has been using tramadol to today for cough with minimal help rec Levaquin 500mg  daily for 7 days  Prednisone taper over next week.  Delsym 2 tsp Twice daily   For cough  Tramadol 50mg  1 every 4hr as needed for breakthrough cough.  Mucinex Twice daily  As needed  Congestion . Saline nasal rinses As needed   Continue on Chlortrimeton 4mg  2 tabs At bedtime      04/26/2014 acute  ov/Allison Gross re: recurrent cough  Chief Complaint  Patient presents with  . Acute Visit    Pt states that her cough is no better since acute visit here on 04/22/14. Her wheezing seems to be getting worse- esp at night. She is using proair about 4 x per day, and duoneb 2-3 x per day.    10/22/13 -p xmas 2015 doing great on symb 80 2 bid , allegra daily, omeprazole 20 qam / h1 x 2 at hs but not h2, then acutely ill with productive cough / sore throat/ subj wheeze esp at hs  But fully ambulatory and note the saba not helping her "wheezing" even in neb form Tramadol x  one no better / tessilon no better, hycodan no better  rec In the the event of cough for any reason: Use the flutter valve as much as possible mucinex dm 1200 mg every 12 hours and supplement with 2 tramadol until no longer coughing And omeprazole 40 mg Take 30- 60 min before your first and last meals of the day and Pepcid 20 mg one at bedtime  For drainage take chlortrimeton (chlorpheniramine) 4 mg every 4 hours available over the counter (may cause drowsiness) Prednisone 10 mg take  4 each am x 2 days,   2 each am x 2 days,  1 each am x 2 days and stop  GERD   If not better see [name] with all meds in hand  Add needs med calendar   07/11/2014 Follow up and med review  rec change chlortrimeton to prn /new med calendar    07/24/14 flare of pnds/cough clear mucus/ rx pred x 6 / did not use chloretrimeton at all    08/14/2014 f/u ov/Allison Gross re: cough 60 % better / using med calendar well  Chief Complaint  Patient presents with  . Follow-up    allergies x 3 weeks; PND; wheezing; cough; took prednisone burst.    Main residual problem is pnds with watery mucus esp when lies down, not  better on allegra qam/nasonex qam rec For drainage take chlortrimeton (chlorpheniramine) 4 mg every 4 hours available over the counter (may cause drowsiness)  If not better > Prednisone 10 mg take  4 each am x 2 days,   2 each am x 2 days,  1 each am x 2 days and stop  Please remember to go to the lab  department downstairs for your tests - we will call you with the results when they are available. Follow the med calendar instructions     11/13/2014 f/u ov/Allison Gross re: chronic cough c/w cough variant asthma on symbiocrt 80 2bid and gerd rx   Chief Complaint  Patient presents with  . Follow-up    Pt states her cough has resolved.    No longer needing any cough meds/ no saba need either  rec Stop prilosec and if start coughing try pepcid 20 mg after breakfast and if not effective go back on  prilosec    02/14/2015  Acute  ov/Allison Gross re: recurrent cough x 16 y Chief Complaint  Patient presents with  . Follow-up    Pt c/o cough for the past 3 wks- had been prod with green sputum but now clear after zithromax. Cough is worse at night- chlorpheniramine helps.   had some problem with ragweed late Aug 2016 made her cough but resolved  a week later then early October bad cough/ greeen mucus gone p zpak Started back on prilosec with ragweed but concernd about safety issues/ only 20 mg daily  Cough all day but esp worse at hs  h1 works well but doesn't use often and never more than one at hs  No need for saba in any form  rec Increase chlortrimeton to where you take 4 mg x 2 at bedtime along with the pepcid 20 mg  GERD  Take delsym two tsp every 12 hours and supplement if needed with  tramadol 50 mg up to 2 every 4 hours to suppress the urge to cough. Swallowing water or using ice chips/non mint and menthol containing candies (such as lifesavers or sugarless jolly ranchers) are also effective.  You should rest your voice and avoid activities that you know make you cough. Once you have eliminated the cough for 3 straight days try reducing the tramadol first,  then the delsym as tolerated.   Prednisone 10 mg take  4 each am x 2 days,   2 each am x 2 days,  1 each am x 2 days and stop  Late add Next ov be sure using med calendar   05/21/2015 acute extended ov/Allison Gross re: cough recurrent/ no med calendar/ maint rx symbicort 80 2bid and gerd rx  Chief Complaint  Patient presents with  . Acute Visit    Pt c/o cough x 2 wks- prod with clear sputum. She has had some yellow nasal d/c. She has had some SOB, but only with heavy exertion.   acute recurrent cough worse at hs/tickle at [facility] [place] notch / onset the day after gave euology for als patient  Albuterol helps just a little / very min sputum production    No obvious day to day or daytime variabilty or assoc sob or cp or chest tightness, subjective  wheeze overt   hb symptoms. No unusual exp hx or h/o childhood pna/ asthma or knowledge of premature birth.  Sleeping ok p gets to sleep without nocturnal  or early am exacerbation  of respiratory  c/o's or need for noct saba. Also denies  any obvious fluctuation of symptoms with weather or environmental changes or other aggravating or alleviating factors except as outlined above   Current Medications, Allergies, Complete Past Medical History, Past Surgical History, Family History, and Social History were reviewed in [employer] record.  ROS  The following are not active complaints unless bolded sore throat, dysphagia, dental problems, itching, sneezing,  nasal congestion or excess/ purulent secretions, ear ache,   fever, chills, sweats, unintended wt loss, pleuritic or exertional cp, hemoptysis,  orthopnea pnd or leg swelling, presyncope, palpitations, heartburn, abdominal pain, anorexia, nausea, vomiting, diarrhea  or change in bowel or urinary habits, change in stools or urine, dysuria,hematuria,  rash, arthralgias, visual complaints, headache, numbness weakness or ataxia or problems with walking or coordination,  change in mood/affect or memory.              .     Past Medical History:  Asthma  - HFA 50% November 10, 2009 > 75% December 24, 2009 > 90% March 26, 2010  Osteopenia  - hold fosfamax 09/2009 >> better with active HB so rec Reclast rx         Objective:   Physical Exam   07/14/2012   158 = baseline /   11/13/2014 162 > 02/14/2015   159 > 05/21/2015     08/14/14 159 lb (72.122 kg)  07/11/14 161 lb 12.8 oz (73.392 kg)  06/05/14 158 lb (71.668 kg)    Vital signs reviewed  GEN: A/Ox3; pleasant , NAD, elderly wf,   HEENT:  [place]/AT,  EACs-clear, TMs-wnl, NOSE-clear drainage  drainage THROAT-clear, no lesions,   No excess pnd   NECK:  Supple w/ fair ROM; no JVD; normal carotid impulses w/o bruits; no thyromegaly or nodules palpated; no  lymphadenopathy.  RESP  CTA ,   no accessory muscle use,     CARD:  RRR, no m/r/g  , no peripheral edema, pulses intact, no cyanosis or clubbing.  GI:   Soft & nt; nml bowel sounds; no organomegaly or masses detected.  Musco: Warm bil, no deformities or joint swelling noted.     CXR PA and Lateral:   04/26/2014 :  No active cardiopulmonary disease.    Assessment:

## 2015-06-06 ENCOUNTER — Telehealth: Payer: Self-pay | Admitting: Internal Medicine

## 2015-06-06 MED ORDER — PREDNISONE 10 MG PO TABS: ORAL_TABLET | ORAL | Status: DC

## 2015-06-06 NOTE — Telephone Encounter (Signed)
See prev note

## 2015-06-06 NOTE — Telephone Encounter (Signed)
Called spoke with pt. appt scheduled to se TP next week. Prednisone called in.

## 2015-06-06 NOTE — Telephone Encounter (Signed)
Spoke with pt. States that her symptoms have returned after finishing prednisone. Reports cough, wheezing and SOB. Cough is producing clear mucus. She is following all of MW's recommendations. Using flutter valve, taking pepcid, mucinex and Tramadol with no relief. Symptoms are worse at night. Would like further recommendations.  MW - please advise. Thanks.   Patient Instructions     Prednisone 10 mg take 4 each am x 2 days, 2 each am x 2 days, 1 each am x 2 days and stop   Add pepcid 20 mg at bedtime whenever coughing and cough into the flutter valve   Take mucinex dm 1200 12 hours and supplement if needed with tramadol 50 mg up to 1- 2 every 4 hours to suppress the urge to cough. Swallowing water or using ice chips/non mint and menthol containing candies (such as lifesavers or sugarless jolly ranchers) are also effective. You should rest your voice and avoid activities that you know make you cough.  Once you have eliminated the cough for 3 straight days try reducing the tramadol first, then the delsym as tolerated.   If not better return with all your medications and your medication calendar to see me or [name]

## 2015-06-06 NOTE — Addendum Note (Signed)
Addended by: Inge Rise on: 06/06/2015 10:53 AM   Modules accepted: Orders

## 2015-06-06 NOTE — Telephone Encounter (Signed)
Ok to take another round of prednisone but go ahead and set up f/u appt as per my last instructions  Prednisone 10 mg take  4 each am x 2 days,   2 each am x 2 days,  1 each am x 2 days and stop

## 2015-06-09 ENCOUNTER — Ambulatory Visit (INDEPENDENT_AMBULATORY_CARE_PROVIDER_SITE_OTHER)
Admission: RE | Admit: 2015-06-09 | Discharge: 2015-06-09 | Disposition: A | Discharge status: Home / Self Care | Payer: BLUE CROSS/BLUE SHIELD | Source: Ambulatory Visit | Attending: Internal Medicine | Admitting: Internal Medicine

## 2015-06-09 ENCOUNTER — Ambulatory Visit (INDEPENDENT_AMBULATORY_CARE_PROVIDER_SITE_OTHER): Payer: BLUE CROSS/BLUE SHIELD | Admitting: Internal Medicine

## 2015-06-09 ENCOUNTER — Ambulatory Visit: Payer: BLUE CROSS/BLUE SHIELD | Admitting: Adult Health

## 2015-06-09 ENCOUNTER — Encounter: Payer: Self-pay | Admitting: Internal Medicine

## 2015-06-09 VITALS — BP 118/66 | HR 72 | Temp 98.1°F | Ht 61.0 in | Wt 157.0 lb

## 2015-06-09 DIAGNOSIS — R05 Cough: Secondary | ICD-10-CM

## 2015-06-09 DIAGNOSIS — J45991 Cough variant asthma: Secondary | ICD-10-CM

## 2015-06-09 DIAGNOSIS — R058 Other specified cough: Secondary | ICD-10-CM

## 2015-06-09 MED ORDER — PANTOPRAZOLE SODIUM 40 MG PO TBEC: 40.0000 mg | DELAYED_RELEASE_TABLET | Freq: Every day | ORAL | Status: DC

## 2015-06-09 MED ORDER — AZITHROMYCIN 250 MG PO TABS: ORAL_TABLET | ORAL | Status: DC

## 2015-06-09 MED ORDER — TRAMADOL HCL 50 MG PO TABS: ORAL_TABLET | ORAL | Status: DC

## 2015-06-09 MED ORDER — PREDNISONE 10 MG PO TABS: ORAL_TABLET | ORAL | Status: DC

## 2015-06-09 NOTE — Progress Notes (Signed)
Quick Note:  Spoke with pt and notified of results per Dr. Wert. Pt verbalized understanding and denied any questions.  ______ 

## 2015-06-09 NOTE — Patient Instructions (Signed)
Zpak/ prednisone called in   Use tramadol up to 50 mg x 2 every 4 hours to get in front of cough  Please remember to go to the  x-ray department downstairs for your tests - we will call you with the results when they are available.

## 2015-06-09 NOTE — Progress Notes (Signed)
Subjective:     Patient ID: Allison Gross, female   DOB: [birth year]   MRN: [id number]   Brief patient profile:  69 yowf quit light smoking 1973 with intermittent sinus/ bronchitis but no chronic c/o's or need for rx not much better after stopped smoking then much worse after around 2000 despite daily maint rx for asthma. Previously found to be allergic to dust, cats and trees and mold but no seasonal pattern and did not feel allergy shots helped in 1990s    History of Present Illness  09/21/2013 Acute OV  Complains of hoarseness, some increased SOB, low grade temp, dry hacking cough with occasional yellow mucus, some tightness x 1 week.  She denies any calf pain, edema, hemoptysis, orthopnea, PND, nausea, vomiting, diarrhea. Does feel, that she's had a low-grade fever. Returned yesterday from a 2 week cruise.  Says many people on the cruise were sick with cough and similar symptoms. Was in [employer].  Finished zpak on 6/3 and pred pak today by ships' physician. Has been using tramadol to today for cough with minimal help rec Levaquin 500mg  daily for 7 days  Prednisone taper over next week.  Delsym 2 tsp Twice daily   For cough  Tramadol 50mg  1 every 4hr as needed for breakthrough cough.  Mucinex Twice daily  As needed  Congestion . Saline nasal rinses As needed   Continue on Chlortrimeton 4mg  2 tabs At bedtime      04/26/2014 acute  ov/Bridey Brookover re: recurrent cough  Chief Complaint  Patient presents with  . Acute Visit    Pt states that her cough is no better since acute visit here on 04/22/14. Her wheezing seems to be getting worse- esp at night. She is using proair about 4 x per day, and duoneb 2-3 x per day.    10/22/13 -p xmas 2015 doing great on symb 80 2 bid , allegra daily, omeprazole 20 qam / h1 x 2 at hs but not h2, then acutely ill with productive cough / sore throat/ subj wheeze esp at hs  But fully ambulatory and note the saba not helping her "wheezing" even in neb form Tramadol x  one no better / tessilon no better, hycodan no better  rec In the the event of cough for any reason: Use the flutter valve as much as possible mucinex dm 1200 mg every 12 hours and supplement with 2 tramadol until no longer coughing And omeprazole 40 mg Take 30- 60 min before your first and last meals of the day and Pepcid 20 mg one at bedtime  For drainage take chlortrimeton (chlorpheniramine) 4 mg every 4 hours available over the counter (may cause drowsiness) Prednisone 10 mg take  4 each am x 2 days,   2 each am x 2 days,  1 each am x 2 days and stop  GERD   If not better see [name] with all meds in hand  Add needs med calendar   07/11/2014 Follow up and med review  rec change chlortrimeton to prn /new med calendar    07/24/14 flare of pnds/cough clear mucus/ rx pred x 6 / did not use chloretrimeton at all    08/14/2014 f/u ov/Braian Tijerina re: cough 60 % better / using med calendar well  Chief Complaint  Patient presents with  . Follow-up    allergies x 3 weeks; PND; wheezing; cough; took prednisone burst.    Main residual problem is pnds with watery mucus esp when lies down, not  better on allegra qam/nasonex qam rec For drainage take chlortrimeton (chlorpheniramine) 4 mg every 4 hours available over the counter (may cause drowsiness)  If not better > Prednisone 10 mg take  4 each am x 2 days,   2 each am x 2 days,  1 each am x 2 days and stop  Please remember to go to the lab  department downstairs for your tests - we will call you with the results when they are available. Follow the med calendar instructions     11/13/2014 f/u ov/Jouri Threat re: chronic cough c/w cough variant asthma on symbiocrt 80 2bid and gerd rx   Chief Complaint  Patient presents with  . Follow-up    Pt states her cough has resolved.    No longer needing any cough meds/ no saba need either  rec Stop prilosec and if start coughing try pepcid 20 mg after breakfast and if not effective go back on  prilosec    02/14/2015  Acute  ov/[place] Luppino re: recurrent cough x 16 y Chief Complaint  Patient presents with  . Follow-up    Pt c/o cough for the past 3 wks- had been prod with green sputum but now clear after zithromax. Cough is worse at night- chlorpheniramine helps.   had some problem with ragweed late Aug 2016 made her cough but resolved  a week later then early October bad cough/ greeen mucus gone p zpak Started back on prilosec with ragweed but concernd about safety issues/ only 20 mg daily  Cough all day but esp worse at hs  h1 works well but doesn't use often and never more than one at hs  No need for saba in any form  rec Increase chlortrimeton to where you take 4 mg x 2 at bedtime along with the pepcid 20 mg  GERD  Take delsym two tsp every 12 hours and supplement if needed with  tramadol 50 mg up to 2 every 4 hours  .   Prednisone 10 mg take  4 each am x 2 days,   2 each am x 2 days,  1 each am x 2 days and stop  Late add Next ov be sure using med calendar   05/21/2015 acute extended ov/Chasya Zenz re: cough recurrent/ no med calendar/ maint rx symbicort 80 2bid and gerd rx  Chief Complaint  Patient presents with  . Acute Visit    Pt c/o cough x 2 wks- prod with clear sputum. She has had some yellow nasal d/c. She has had some SOB, but only with heavy exertion.   acute recurrent cough worse at hs/tickle at [facility] / onset the day after gave euology Jan 2017  for als patient  Albuterol helps just a little / very min sputum production  rec Prednisone 10 mg take  4 each am x 2 days,   2 each am x 2 days,  1 each am x 2 days and stop  Add pepcid 20 mg at bedtime whenever coughing and cough into the flutter valve  Take mucinex dm 1200 12 hours and supplement if needed with  tramadol 50 mg up to 1- 2 every 4 hours    06/09/2015  Acute ov/Marshaun Lortie re: recurrent cough x 16 y - brought med calendar  maint rx for cough variant asthma with symbicort 80 2bid   Chief Complaint  Patient presents  with  . Acute Visit    Pt c/o low grade temp x 3 days- she is still coughing "constantly"-  prod with clear sputum. She also c/o wheezing "more than I have ever wheezed before".    worse since 06/04/15 this time more than the cough =  fatigue then low gradefever started 06/06/15   s HA, sorethroat or myalgias/arthralgias  Not using tramadol or flutter as per the action plan, only on omeprazole 20 qd    No obvious day to day or daytime variabilty or assoc sob or cp or chest tightness, subjective wheeze overt   hb symptoms. No unusual exp hx or h/o childhood pna/ asthma or knowledge of premature birth.  Sleeping ok p gets to sleep without nocturnal  or early am exacerbation  of respiratory  c/o's or need for noct saba. Also denies any obvious fluctuation of symptoms with weather or environmental changes or other aggravating or alleviating factors except as outlined above   Current Medications, Allergies, Complete Past Medical History, Past Surgical History, Family History, and Social History were reviewed in [employer] record.  ROS  The following are not active complaints unless bolded sore throat, dysphagia, dental problems, itching, sneezing,  nasal congestion or excess/ purulent secretions, ear ache,   fever, chills, sweats, unintended wt loss, pleuritic or exertional cp, hemoptysis,  orthopnea pnd or leg swelling, presyncope, palpitations, heartburn, abdominal pain, anorexia, nausea, vomiting, diarrhea  or change in bowel or urinary habits, change in stools or urine, dysuria,hematuria,  rash, arthralgias, visual complaints, headache, numbness weakness or ataxia or problems with walking or coordination,  change in mood/affect or memory.              .     Past Medical History:  Asthma  - HFA 50% November 10, 2009 > 75% December 24, 2009 > 90% March 26, 2010  Osteopenia  - hold fosfamax 09/2009 >> better with active HB so rec Reclast rx         Objective:    Physical Exam   07/14/2012   158 = baseline /   11/13/2014 162 > 02/14/2015   159 >  06/09/2015  157    08/14/14 159 lb (72.122 kg)  07/11/14 161 lb 12.8 oz (73.392 kg)  06/05/14 158 lb (71.668 kg)    Vital signs reviewed  GEN: A/Ox3; pleasant , NAD, elderly wf,   HEENT:  [place]/AT,  EACs-clear, TMs-wnl, NOSE-clear drainage  drainage THROAT-clear, no lesions,   No excess pnd   NECK:  Supple w/ fair ROM; no JVD; normal carotid impulses w/o bruits; no thyromegaly or nodules palpated; no lymphadenopathy.  RESP     no accessory muscle use,   Wheezing eliminated with puffing and purse lip maneuver   CARD:  RRR, no m/r/g  , no peripheral edema, pulses intact, no cyanosis or clubbing.  GI:   Soft & nt; nml bowel sounds; no organomegaly or masses detected.  Musco: Warm bil, no deformities or joint swelling noted.     CXR PA and Lateral:   06/09/2015 :    I personally reviewed images and agree with radiology impression as follows:   Chronic bronchitic changes. There is no alveolar pneumonia. Moderate-sized hiatal hernia. Stable cardiomegaly without pulmonary vascular congestion.     Assessment:

## 2015-06-10 ENCOUNTER — Encounter: Payer: Self-pay | Admitting: Internal Medicine

## 2015-06-10 NOTE — Assessment & Plan Note (Addendum)
-   HFA 90% p coaching 04/23/11 - c/o hoarseness > added spacer 12/01/2012  > improved but stopped using it  - maint on symbicort 80 2bid  - try taper off gerd rx 11/13/14 > flared end of August 2016  - NO 05/21/2015  = 5   Adequate control on present rx, reviewed > no change in rx needed  = symbicort 80 2bid

## 2015-06-10 NOTE — Assessment & Plan Note (Addendum)
-   failed singulair around 2005  - allergy profile 08/14/2014 >  IgE  129 with pos RAST Dust >  Cat > dog   Clearly this is not asthma exac but much more likely  Classic Upper airway cough syndrome, so named because it's frequently impossible to sort out how much is  CR/sinusitis with freq throat clearing (which can be related to primary GERD)   vs  causing  secondary (" extra esophageal")  GERD from wide swings in gastric pressure that occur with throat clearing, often  promoting self use of mint and menthol lozenges that reduce the lower esophageal sphincter tone and exacerbate the problem further in a cyclical fashion.   These are the same pts (now being labeled as having "irritable larynx syndrome" by some cough centers) who not infrequently have a history of having failed to tolerate ace inhibitors,  dry powder inhalers or biphosphonates or report having atypical reflux symptoms that don't respond to standard doses of PPI , and are easily confused as having aecopd or asthma flares by even experienced allergists/ pulmonologists.  Explained the natural history of uri and why it's necessary in patients at risk to treat GERD aggressively - at least  short term -   to reduce risk of evolving cyclical cough initially  triggered by epithelial injury and a heightened sensitivty to the effects of any upper airway irritants,  most importantly acid - related - then perpetuated by epithelial injury related to the cough itself as the upper airway collapses on itself.  That is, the more sensitive the epithelium becomes once it is damaged by the virus, the more the ensuing irritability> the more the cough, the more the secondary reflux (especially in those prone to reflux) the more the irritation of the sensitive mucosa and so on in a  Classic cyclical pattern.    rec cyclical cough regimen/ add zpak  I had an extended discussion with the patient reviewing all relevant studies completed to date and  lasting 15 to  20 minutes of a 25 minute visit    Each maintenance medication was reviewed in detail including most importantly the difference between maintenance and prns and under what circumstances the prns are to be triggered using an action plan format that is not reflected in the computer generated alphabetically organized AVS but trather by a customized med calendar that reflects the AVS meds with confirmed 100% correlation.   Please see instructions for details which were reviewed in writing and the patient given a copy highlighting the part that I personally wrote and discussed at today's ov.

## 2015-06-12 ENCOUNTER — Telehealth: Payer: Self-pay | Admitting: Internal Medicine

## 2015-06-12 NOTE — Telephone Encounter (Signed)
cxr reviewed with pt, sounded fine on phone, f/u 2/24 pm if not better on max rx including nebs/pred/tramadol/flutter

## 2015-06-12 NOTE — Telephone Encounter (Signed)
Spoke with the pt  She states that she is still coughing "non stop" since last ov 2/20 She states cough is no worse, and is still just coughing up clear sputum She has been coughing so much she loses control of her bladder   She has been kept awake at night due to cough and plus thinks chlorpheniramine keeps her up so she stopped taking her 8 mg at night and is only taking prn during the day  She has not had anymore fever, but states "temperature still not right"- has been reading lower than normal for her  She is still taking pred taper and zithromax Please advise recs thanks!  Below are recs from recent ov: Zpak/ prednisone called in   Use tramadol up to 50 mg x 2 every 4 hours to get in front of cough  Please remember to go to the x-ray department downstairs for your tests - we will call you with the results when they are available

## 2015-06-30 ENCOUNTER — Telehealth: Payer: Self-pay | Admitting: Internal Medicine

## 2015-06-30 MED ORDER — TRAMADOL HCL 50 MG PO TABS: ORAL_TABLET | ORAL | Status: DC

## 2015-06-30 NOTE — Telephone Encounter (Signed)
Pt requesting a refill Hydromet or Tramadol Pt having increased cough at night and unable to sleep.  States that she has used Mucinex DM and is not getting any relief.  Denies any other symptoms - no congestion, SOB, mucus production. Tramadol last filled 06/09/15 Hydromet last filled 08/02/14 Denies fever. Please advise Dr Melvyn Novas if able to refill one or the other.

## 2015-06-30 NOTE — Telephone Encounter (Signed)
Prefer tramadol and needs f/u See [name] w/in 2 weeks or first avail after that with all your medications, even over the counter meds, separated in two separate bags, the ones you take no matter what vs the ones you stop once you feel better and take only as needed when you feel you need them.   Tammy  will generate for you a new user friendly medication calendar that will put [place] all on the same page re: your medication use.     Without this process, it simply isn't possible to assure that we are providing  your outpatient care  with  the attention to detail we feel you deserve.   If we cannot assure that you're getting that kind of care,  then we cannot manage your problem effectively from this clinic.  Once you have seen Tammy and we are sure that we're all on the same page with your medication use she will arrange follow up with me.

## 2015-06-30 NOTE — Telephone Encounter (Signed)
Spoke with pt, aware of rx refill approval.  Scheduled med cal in 2 weeks with TP.  rx called in to preferred pharmacy.  Nothing further needed.

## 2015-07-14 ENCOUNTER — Encounter: Payer: Self-pay | Admitting: Adult Health

## 2015-07-14 ENCOUNTER — Ambulatory Visit (INDEPENDENT_AMBULATORY_CARE_PROVIDER_SITE_OTHER): Payer: BLUE CROSS/BLUE SHIELD | Admitting: Adult Health

## 2015-07-14 VITALS — BP 132/90 | HR 80 | Temp 98.0°F | Ht 62.0 in | Wt 159.0 lb

## 2015-07-14 DIAGNOSIS — R05 Cough: Secondary | ICD-10-CM | POA: Diagnosis not present

## 2015-07-14 DIAGNOSIS — R058 Other specified cough: Secondary | ICD-10-CM

## 2015-07-14 NOTE — Progress Notes (Signed)
Chart and office note reviewed in detail  > agree with a/p as outlined    

## 2015-07-14 NOTE — Progress Notes (Signed)
Subjective:     Patient ID: Allison Gross, female   DOB: [birth year]   MRN: [id number]   Brief patient profile:  70 yowf quit light smoking 1973 with intermittent sinus/ bronchitis but no chronic c/o's or need for rx not much better after stopped smoking then much worse after around 2000 despite daily maint rx for asthma. Previously found to be allergic to dust, cats and trees and mold but no seasonal pattern and did not feel allergy shots helped in 1990s    History of Present Illness  09/21/2013 Acute OV  Complains of hoarseness, some increased SOB, low grade temp, dry hacking cough with occasional yellow mucus, some tightness x 1 week.  She denies any calf pain, edema, hemoptysis, orthopnea, PND, nausea, vomiting, diarrhea. Does feel, that she's had a low-grade fever. Returned yesterday from a 2 week cruise.  Says many people on the cruise were sick with cough and similar symptoms. Was in [place].  Finished zpak on 6/3 and pred pak today by ships' physician. Has been using tramadol to today for cough with minimal help rec Levaquin 500mg  daily for 7 days  Prednisone taper over next week.  Delsym 2 tsp Twice daily   For cough  Tramadol 50mg  1 every 4hr as needed for breakthrough cough.  Mucinex Twice daily  As needed  Congestion . Saline nasal rinses As needed   Continue on Chlortrimeton 4mg  2 tabs At bedtime      04/26/2014 acute  ov/Wert re: recurrent cough  Chief Complaint  Patient presents with  . Acute Visit    Pt states that her cough is no better since acute visit here on 04/22/14. Her wheezing seems to be getting worse- esp at night. She is using proair about 4 x per day, and duoneb 2-3 x per day.    10/22/13 -p xmas 2015 doing great on symb 80 2 bid , allegra daily, omeprazole 20 qam / h1 x 2 at hs but not h2, then acutely ill with productive cough / sore throat/ subj wheeze esp at hs  But fully ambulatory and note the saba not helping her "wheezing" even in neb form Tramadol x  one no better / tessilon no better, hycodan no better  rec In the the event of cough for any reason: Use the flutter valve as much as possible mucinex dm 1200 mg every 12 hours and supplement with 2 tramadol until no longer coughing And omeprazole 40 mg Take 30- 60 min before your first and last meals of the day and Pepcid 20 mg one at bedtime  For drainage take chlortrimeton (chlorpheniramine) 4 mg every 4 hours available over the counter (may cause drowsiness) Prednisone 10 mg take  4 each am x 2 days,   2 each am x 2 days,  1 each am x 2 days and stop  GERD   If not better see [name] with all meds in hand  Add needs med calendar   07/11/2014 Follow up and med review  rec change chlortrimeton to prn /new med calendar    07/24/14 flare of pnds/cough clear mucus/ rx pred x 6 / did not use chloretrimeton at all    08/14/2014 f/u ov/Wert re: cough 60 % better / using med calendar well  Chief Complaint  Patient presents with  . Follow-up    allergies x 3 weeks; PND; wheezing; cough; took prednisone burst.    Main residual problem is pnds with watery mucus esp when lies down, not  better on allegra qam/nasonex qam rec For drainage take chlortrimeton (chlorpheniramine) 4 mg every 4 hours available over the counter (may cause drowsiness)  If not better > Prednisone 10 mg take  4 each am x 2 days,   2 each am x 2 days,  1 each am x 2 days and stop  Please remember to go to the lab  department downstairs for your tests - we will call you with the results when they are available. Follow the med calendar instructions     11/13/2014 f/u ov/Wert re: chronic cough c/w cough variant asthma on symbiocrt 80 2bid and gerd rx   Chief Complaint  Patient presents with  . Follow-up    Pt states her cough has resolved.    No longer needing any cough meds/ no saba need either  rec Stop prilosec and if start coughing try pepcid 20 mg after breakfast and if not effective go back on  prilosec    02/14/2015  Acute  ov/Wert re: recurrent cough x 16 y Chief Complaint  Patient presents with  . Follow-up    Pt c/o cough for the past 3 wks- had been prod with green sputum but now clear after zithromax. Cough is worse at night- chlorpheniramine helps.   had some problem with ragweed late Aug 2016 made her cough but resolved  a week later then early October bad cough/ greeen mucus gone p zpak Started back on prilosec with ragweed but concernd about safety issues/ only 20 mg daily  Cough all day but esp worse at hs  h1 works well but doesn't use often and never more than one at hs  No need for saba in any form  rec Increase chlortrimeton to where you take 4 mg x 2 at bedtime along with the pepcid 20 mg  GERD  Take delsym two tsp every 12 hours and supplement if needed with  tramadol 50 mg up to 2 every 4 hours  .   Prednisone 10 mg take  4 each am x 2 days,   2 each am x 2 days,  1 each am x 2 days and stop  Late add Next ov be sure using med calendar   05/21/2015 acute extended ov/Wert re: cough recurrent/ no med calendar/ maint rx symbicort 80 2bid and gerd rx  Chief Complaint  Patient presents with  . Acute Visit    Pt c/o cough x 2 wks- prod with clear sputum. She has had some yellow nasal d/c. She has had some SOB, but only with heavy exertion.   acute recurrent cough worse at hs/tickle at [facility] / onset the day after gave euology Jan 2017  for als patient  Albuterol helps just a little / very min sputum production  rec Prednisone 10 mg take  4 each am x 2 days,   2 each am x 2 days,  1 each am x 2 days and stop  Add pepcid 20 mg at bedtime whenever coughing and cough into the flutter valve  Take mucinex dm 1200 12 hours and supplement if needed with  tramadol 50 mg up to 1- 2 every 4 hours    06/09/2015  Acute ov/Wert re: recurrent cough x 16 y - brought med calendar  maint rx for cough variant asthma with symbicort 80 2bid   Chief Complaint  Patient presents  with  . Acute Visit    Pt c/o low grade temp x 3 days- she is still coughing "constantly"-  prod with clear sputum. She also c/o wheezing "more than I have ever wheezed before".    worse since 06/04/15 this time more than the cough =  fatigue then low gradefever started 06/06/15   s HA, sorethroat or myalgias/arthralgias  Not using tramadol or flutter as per the action plan, only on omeprazole 20 qd  >>zpack and pred   07/14/2015 Follow up: Recurrent cough vs Cough Variant Asthma  Pt returns for follow up and med review  We reviewed all her meds and organized them into a med calendar .  Appears to be taking meds correctly.  Pt had cough flare last ov, tx with zpack and pred taper . She is better. Cough is lingering but better.  Dyspnea and wheezing are decreased . Marland Kitchen Denies any sinus pressure/drainage, chest congestion/tightness, nausea or vomiting.      Current Medications, Allergies, Complete Past Medical History, Past Surgical History, Family History, and Social History were reviewed in [employer] record.  ROS  The following are not active complaints unless bolded sore throat, dysphagia, dental problems, itching, sneezing,  nasal congestion or excess/ purulent secretions, ear ache,   chills, sweats, unintended wt loss, pleuritic or exertional cp, hemoptysis,  orthopnea pnd or leg swelling, presyncope, palpitations, heartburn, abdominal pain, anorexia, nausea, vomiting, diarrhea  or change in bowel or urinary habits, change in stools or urine, dysuria,hematuria,  rash, arthralgias, visual complaints, headache, numbness weakness or ataxia or problems with walking or coordination,  change in mood/affect or memory.              .     Past Medical History:  Asthma  - HFA 50% November 10, 2009 > 75% December 24, 2009 > 90% March 26, 2010  Osteopenia  - hold fosfamax 09/2009 >> better with active HB so rec Reclast rx         Objective:   Physical Exam   07/14/2012    158 = baseline /   11/13/2014 162 > 02/14/2015   159 >  06/09/2015  157 Filed Vitals:   07/14/15 1015  BP: 132/90  Pulse: 80  Temp: 98 F (36.7 C)  TempSrc: Oral  Height: 5\' 2"  (1.575 m)  Weight: 159 lb (72.122 kg)  SpO2: 100%     Vital signs reviewed  GEN: A/Ox3; pleasant , NAD, elderly wf,   HEENT:  [place]/AT,  EACs-clear, TMs-wnl, NOSE-clear drainage  drainage THROAT-clear, no lesions,   No excess pnd   NECK:  Supple w/ fair ROM; no JVD; normal carotid impulses w/o bruits; no thyromegaly or nodules palpated; no lymphadenopathy.  RESP     no accessory muscle use,    CARD:  RRR, no m/r/g  , no peripheral edema, pulses intact, no cyanosis or clubbing.  GI:   Soft & nt; nml bowel sounds; no organomegaly or masses detected.  Musco: Warm bil, no deformities or joint swelling noted.     CXR PA and Lateral:   06/09/2015 :    Chronic bronchitic changes. There is no alveolar pneumonia. Moderate-sized hiatal hernia. Stable cardiomegaly without pulmonary vascular congestion.     Assessment:

## 2015-07-14 NOTE — Patient Instructions (Signed)
Follow med calendar closely and bring to each visit.  Follow up Dr. Wert  In 4 months and As needed     

## 2015-07-14 NOTE — Assessment & Plan Note (Signed)
Recent flare now resolving  Patient's medications were reviewed today and patient education was given. Computerized medication calendar was adjusted/completed   Plan  Follow med calendar closely and bring to each visit.  Follow up Dr. Melvyn Novas  In 4 months and As needed

## 2015-07-18 ENCOUNTER — Telehealth: Payer: Self-pay | Admitting: Internal Medicine

## 2015-07-18 MED ORDER — MOMETASONE FUROATE 50 MCG/ACT NA SUSP: NASAL | Status: DC

## 2015-07-18 NOTE — Telephone Encounter (Signed)
Spoke with pt. She needs a refill on Nasonex. This has been sent to [facility]. Nothing further was needed.

## 2015-08-05 ENCOUNTER — Telehealth: Payer: Self-pay | Admitting: Internal Medicine

## 2015-08-05 MED ORDER — MOMETASONE FUROATE 50 MCG/ACT NA SUSP: NASAL | Status: DC

## 2015-08-05 NOTE — Telephone Encounter (Signed)
Called and spoke with pt. She states that she has not received her nasonex which [facility]15. [facility] that she can pick up the medication today. I explained to her that I would send the medication to the pharmacy. She voiced understanding and had no further questions. Rx sent. Nothing further needed.

## 2015-08-14 ENCOUNTER — Telehealth: Payer: Self-pay | Admitting: Internal Medicine

## 2015-08-14 MED ORDER — BUDESONIDE 32 MCG/ACT NA SUSP: 2.0000 | Freq: Every day | NASAL | Status: DC

## 2015-08-14 NOTE — Telephone Encounter (Signed)
rhinocort best option

## 2015-08-14 NOTE — Telephone Encounter (Signed)
Spoke with the pt and notified of recs per MW  She verbalized understanding  I have sent rx to pharm  Nothing further needed

## 2015-08-14 NOTE — Telephone Encounter (Signed)
Spoke with pharmacist and Nasonex requires prior approval.  Plan prefers pt use otc Flonase or Rhinocort.  Pt is ok with this.  Please advise which pt should use or if you prefer a prior approval.

## 2015-08-19 ENCOUNTER — Other Ambulatory Visit: Payer: Self-pay | Admitting: Internal Medicine

## 2015-08-19 MED ORDER — BUDESONIDE-FORMOTEROL FUMARATE 80-4.5 MCG/ACT IN AERO: INHALATION_SPRAY | RESPIRATORY_TRACT | Status: DC

## 2015-09-02 ENCOUNTER — Telehealth: Payer: Self-pay | Admitting: Internal Medicine

## 2015-09-02 MED ORDER — TRAMADOL HCL 50 MG PO TABS: ORAL_TABLET | ORAL | Status: DC

## 2015-09-02 NOTE — Telephone Encounter (Signed)
rx called in to below verified pharmacy. lmtcb X1 to make pt aware.

## 2015-09-02 NOTE — Telephone Encounter (Signed)
Spoke with pt and she states that she is going out of the country [place] 2 weeks and would like to have Tramadol on hand for a flare.   MW please advise. Thanks!  LOV 07/14/15 w/TP, 06/09/15 w/MW    Patient Instructions     Zpak/ prednisone called in   Use tramadol up to 50 mg x 2 every 4 hours to get in front of cough  Please remember to go to the x-ray department downstairs for your tests - we will call you with the results when they are available.

## 2015-09-02 NOTE — Telephone Encounter (Signed)
Ok x 40, needs to return for f/u and regroup when returns and have a safe trip

## 2015-09-03 ENCOUNTER — Telehealth: Payer: Self-pay | Admitting: Internal Medicine

## 2015-09-03 NOTE — Telephone Encounter (Signed)
Spoke with pt and she was checking on rx refill. It has been sent to pharmacy as requested. Nothing further needed.

## 2015-09-03 NOTE — Telephone Encounter (Signed)
Patient called returning our call -prm  °

## 2015-09-03 NOTE — Telephone Encounter (Signed)
lmtcb for pt to make aware that requested refill has been sent to pharmacy.

## 2015-09-04 NOTE — Telephone Encounter (Signed)
lmtcb x2 for pt. 

## 2015-09-04 NOTE — Telephone Encounter (Signed)
Patient called said that she already spoke to [place] and is aware that her rx was sent to the pharmacy and she doesn't need a call back unless there is a change - prm

## 2015-10-23 ENCOUNTER — Telehealth: Payer: Self-pay | Admitting: Internal Medicine

## 2015-10-23 MED ORDER — TRAMADOL HCL 50 MG PO TABS: ORAL_TABLET | ORAL | Status: DC

## 2015-10-23 NOTE — Telephone Encounter (Signed)
Spoke with pt. She is aware of MW's response. States that she does not think she will need another refill. Rx has been called in. Nothing further was needed.

## 2015-10-23 NOTE — Telephone Encounter (Signed)
Ok x one but needs ov with all meds /med calendar before next refill

## 2015-10-23 NOTE — Telephone Encounter (Signed)
Spoke with pt. States that she is still having issues with coughing. Cough is productive with clear mucus. Denies chest tightness, wheezing, SOB or fever. Pt is requesting a refill on Tramadol. This was last filled by MW on 09/02/15 #40.  MW - please advise on refill. Thanks.

## 2015-11-13 ENCOUNTER — Encounter: Payer: Self-pay | Admitting: Internal Medicine

## 2015-11-13 ENCOUNTER — Ambulatory Visit (INDEPENDENT_AMBULATORY_CARE_PROVIDER_SITE_OTHER): Payer: BLUE CROSS/BLUE SHIELD | Admitting: Internal Medicine

## 2015-11-13 VITALS — BP 128/84 | HR 76 | Ht 61.0 in | Wt 156.6 lb

## 2015-11-13 DIAGNOSIS — R05 Cough: Secondary | ICD-10-CM

## 2015-11-13 DIAGNOSIS — J45991 Cough variant asthma: Secondary | ICD-10-CM

## 2015-11-13 DIAGNOSIS — R058 Other specified cough: Secondary | ICD-10-CM

## 2015-11-13 NOTE — Progress Notes (Signed)
Subjective:     Patient ID: Allison Gross, female   DOB: [birth year]   MRN: [id number]   Brief patient profile:  70 yowf quit light smoking 1973 with intermittent sinus/ bronchitis but no chronic c/o's or need for rx not much better after stopped smoking then much worse after around 2000 despite daily maint rx for asthma. Previously found to be allergic to dust, cats and trees and mold but no seasonal pattern and did not feel allergy shots helped in 1990s    History of Present Illness  09/21/2013 Acute OV  Complains of hoarseness, some increased SOB, low grade temp, dry hacking cough with occasional yellow mucus, some tightness x 1 week.  She denies any calf pain, edema, hemoptysis, orthopnea, PND, nausea, vomiting, diarrhea. Does feel, that she's had a low-grade fever. Returned yesterday from a 2 week cruise.  Says many people on the cruise were sick with cough and similar symptoms. Was in [place].  Finished zpak on 6/3 and pred pak today by ships' physician. Has been using tramadol to today for cough with minimal help rec Levaquin 500mg  daily for 7 days  Prednisone taper over next week.  Delsym 2 tsp Twice daily   For cough  Tramadol 50mg  1 every 4hr as needed for breakthrough cough.  Mucinex Twice daily  As needed  Congestion . Saline nasal rinses As needed   Continue on Chlortrimeton 4mg  2 tabs At bedtime      04/26/2014 acute  ov/Lenzie Montesano re: recurrent cough  Chief Complaint  Patient presents with  . Acute Visit    Pt states that her cough is no better since acute visit here on 04/22/14. Her wheezing seems to be getting worse- esp at night. She is using proair about 4 x per day, and duoneb 2-3 x per day.    10/22/13 -p xmas 2015 doing great on symb 80 2 bid , allegra daily, omeprazole 20 qam / h1 x 2 at hs but not h2, then acutely ill with productive cough / sore throat/ subj wheeze esp at hs  But fully ambulatory and note the saba not helping her "wheezing" even in neb form Tramadol x  one no better / tessilon no better, hycodan no better  rec In the the event of cough for any reason: Use the flutter valve as much as possible mucinex dm 1200 mg every 12 hours and supplement with 2 tramadol until no longer coughing And omeprazole 40 mg Take 30- 60 min before your first and last meals of the day and Pepcid 20 mg one at bedtime  For drainage take chlortrimeton (chlorpheniramine) 4 mg every 4 hours available over the counter (may cause drowsiness) Prednisone 10 mg take  4 each am x 2 days,   2 each am x 2 days,  1 each am x 2 days and stop  GERD   If not better see [name] with all meds in hand  Add needs med calendar   07/11/2014 Follow up and med review  rec change chlortrimeton to prn /new med calendar    07/24/14 flare of pnds/cough clear mucus/ rx pred x 6 / did not use chloretrimeton at all    11/13/2015  f/u ov/Tyheim Van[place]ne re: chronic cough  Chief Complaint  Patient presents with  . Follow-up    Cough has improved. No new co's today.   only coughs in church     avg tramadol 1-2 per week  Not limited by breathing from desired activities  No obvious day to day or daytime variability or assoc excess/ purulent sputum or mucus plugs or hemoptysis or cp or chest tightness, subjective wheeze or overt sinus or hb symptoms. No unusual exp hx or h/o childhood pna/ asthma or knowledge of premature birth.  Sleeping ok without nocturnal  or early am exacerbation  of respiratory  c/o's or need for noct saba. Also denies any obvious fluctuation of symptoms with weather or environmental changes or other aggravating or alleviating factors except as outlined above   Current Medications, Allergies, Complete Past Medical History, Past Surgical History, Family History, and Social History were reviewed in [employer] record.  ROS  The following are not active complaints unless bolded sore throat, dysphagia, dental problems, itching, sneezing,  nasal congestion or  excess/ purulent secretions, ear ache,   fever, chills, sweats, unintended wt loss, classically pleuritic or exertional cp,  orthopnea pnd or leg swelling, presyncope, palpitations, abdominal pain, anorexia, nausea, vomiting, diarrhea  or change in bowel or bladder habits, change in stools or urine, dysuria,hematuria,  rash, arthralgias, visual complaints, headache, numbness, weakness or ataxia or problems with walking or coordination,  change in mood/affect or memory.         Past Medical History:  Asthma  - HFA 50% November 10, 2009 > 75% December 24, 2009 > 90% March 26, 2010  Osteopenia  - hold fosfamax 09/2009 >> better with active HB so rec Reclast rx         Objective:   Physical Exam   07/14/2012   158 = baseline /   11/13/2014 162 > 02/14/2015   159 >  06/09/2015  157 > 11/13/2015  156    Vital signs reviewed  GEN: A/Ox3; pleasant , NAD, elderly wf,   HEENT:  [place]/AT,  EACs-clear, TMs-wnl, NOSE-clear drainage  drainage THROAT-clear, no lesions,   No excess pnd   NECK:  Supple w/ fair ROM; no JVD; normal carotid impulses w/o bruits; no thyromegaly or nodules palpated; no lymphadenopathy.    RESP     no accessory muscle use,    CARD:  RRR, no m/r/g  , no peripheral edema, pulses intact, no cyanosis or clubbing.  GI:   Soft & nt; nml bowel sounds; no organomegaly or masses detected.   Musco: Warm bil, no deformities or joint swelling noted.     CXR PA and Lateral:   06/09/2015 :    Chronic bronchitic changes. There is no alveolar pneumonia. Moderate-sized hiatal hernia. Stable cardiomegaly without pulmonary vascular congestion.     Assessment:      Outpatient Encounter Prescriptions as of 11/13/2015  Medication Sig  . albuterol (PROVENTIL HFA;VENTOLIN HFA) 108 (90 BASE) MCG/ACT inhaler Inhale 2 puffs into the lungs every 4 (four) hours as needed for wheezing or shortness of breath (((PLAN B))).  . budesonide (RHINOCORT AQUA) 32 MCG/ACT nasal spray Place 2 sprays into both  nostrils daily.  . budesonide-formoterol (SYMBICORT) 80-4.5 MCG/ACT inhaler INHALE 2 PUFFS FIRST THING IN THE MORNING AND 2 PUFFS 12 HOURS LATER.  . calcium-vitamin D (OSCAL WITH D) 500-200 MG-UNIT per tablet Take 1 tablet by mouth daily with breakfast.   . carbamazepine (TEGRETOL XR) 200 MG 12 hr tablet Take 200 mg by mouth 2 (two) times daily.    . chlorpheniramine (CHLOR-TRIMETON) 4 MG tablet Take 4 mg by mouth every 4 (four) hours as needed (drippy nose, drainage, throat clearing).   . famotidine (PEPCID) 20 MG tablet Take 20 mg by mouth at  bedtime.  . fexofenadine (ALLEGRA) 180 MG tablet Take 180 mg by mouth every morning.   . Guaifenesin (MUCINEX MAXIMUM STRENGTH) 1200 MG TB12 Take 1 tablet by mouth 2 (two) times daily as needed.   Marland Kitchen ibuprofen (ADVIL,MOTRIN) 400 MG tablet Take 400 mg by mouth every 8 (eight) hours as needed (joint pain).   Marland Kitchen ipratropium-albuterol (DUONEB) 0.5-2.5 (3) MG/3ML SOLN Take 3 mLs by nebulization every 4 (four) hours as needed (Wheezing/shortness of breath ((PLAN C))).   Marland Kitchen losartan-hydrochlorothiazide (HYZAAR) 50-12.5 MG per tablet Take 1 tablet by mouth every morning.   . Melatonin 3 MG TABS Take 1 tablet by mouth at bedtime.   . Multiple Vitamin (MULTIVITAMIN) tablet Take 1 tablet by mouth every morning.   . pantoprazole (PROTONIX) 40 MG tablet Take 1 tablet (40 mg total) by mouth daily. Take 30-60 min before first meal of the day  . potassium chloride (K-DUR) 10 MEQ tablet Take 1 tablet by mouth daily. ((2 tabs on Mondays and Fridays))  . Respiratory Therapy Supplies (FLUTTER) DEVI As instructed  . traMADol (ULTRAM) 50 MG tablet 1-2 every 4 hours as needed for cough or pain  . Turmeric 500 MG CAPS Take 1 capsule by mouth every morning.   . vitamin B-12 (CYANOCOBALAMIN) 500 MCG tablet Take 500 mcg by mouth every morning.   . vitamin C (ASCORBIC ACID) 500 MG tablet Take 500 mg by mouth every morning.   . [DISCONTINUED] mometasone (NASONEX) 50 MCG/ACT nasal spray  USE TWO SPRAYS IN EACH NOSTRIL EVERY DAY.   No facility-administered encounter medications on file as of 11/13/2015.

## 2015-11-13 NOTE — Patient Instructions (Addendum)
See calendar for specific medication instructions and bring it back for each and every office visit for every healthcare provider you see.  Without it,  you may not receive the best quality medical care that we feel you deserve. ° °You will note that the calendar groups together  your maintenance  medications that are timed at particular times of the day.  Think of this as your checklist for what your doctor has instructed you to do until your next evaluation to see what benefit  there is  to staying on a consistent group of medications intended to keep you well.  The other group at the bottom is entirely up to you to use as you see fit  for specific symptoms that may arise between visits that require you to treat them on an as needed basis.  Think of this as your action plan or "what if" list.  ° °Separating the top medications from the bottom group is fundamental to providing you adequate care going forward.   ° ° °Please schedule a follow up visit in 6 months but call sooner if needed  ° ° ° ° ° ° ° °

## 2015-11-14 ENCOUNTER — Encounter: Payer: Self-pay | Admitting: Internal Medicine

## 2015-11-14 NOTE — Assessment & Plan Note (Signed)
-   failed singulair around 2005  - allergy profile 08/14/2014 >  IgE  129 with pos RAST Dust >  Cat > dog  -med calendar 07/14/2015   Finally achieving longterm cough control with min use of tramadol   I had an extended discussion with the patient reviewing all relevant studies completed to date and  lasting 15 to 20 minutes of a 25 minute visit    Each maintenance medication was reviewed in detail including most importantly the difference between maintenance and prns and under what circumstances the prns are to be triggered using an action plan format that is not reflected in the computer generated alphabetically organized AVS but trather by a customized med calendar that reflects the AVS meds with confirmed 100% correlation.   Please see instructions for details which were reviewed in writing and the patient given a copy highlighting the part that I personally wrote and discussed at today's ov.

## 2015-11-14 NOTE — Assessment & Plan Note (Signed)
-  HFA 90% p coaching 04/23/11 - c/o hoarseness > added spacer 12/01/2012  > improved but stopped using it  - maint on symbicort 80 2bid  - try taper off gerd rx 11/13/14 > flared end of August 2016  - NO 05/21/2015  = 5   All goals of chronic asthma control met including optimal function and elimination of symptoms with minimal need for rescue therapy.  Contingencies discussed in full including contacting this office immediately if not controlling the symptoms using the rule of two's.

## 2016-03-24 ENCOUNTER — Other Ambulatory Visit: Payer: Self-pay

## 2016-03-24 ENCOUNTER — Telehealth: Payer: Self-pay | Admitting: Internal Medicine

## 2016-03-24 MED ORDER — BUDESONIDE-FORMOTEROL FUMARATE 80-4.5 MCG/ACT IN AERO: INHALATION_SPRAY | RESPIRATORY_TRACT | 3 refills | Status: DC

## 2016-03-24 NOTE — Telephone Encounter (Signed)
Spoke with pt. And a refill was sent to her pharmacy of choice. Nothing further is needed at this time.

## 2016-03-31 ENCOUNTER — Encounter: Payer: Self-pay | Admitting: Gastroenterology

## 2016-03-31 ENCOUNTER — Telehealth: Payer: Self-pay | Admitting: Gastroenterology

## 2016-03-31 NOTE — Telephone Encounter (Signed)
Dr. Stark reviewed records and has accepted patient. Ok to schedule Direct Colon. Colonoscopy scheduled. °

## 2016-04-06 ENCOUNTER — Other Ambulatory Visit: Payer: Self-pay | Admitting: Internal Medicine

## 2016-05-17 ENCOUNTER — Ambulatory Visit (INDEPENDENT_AMBULATORY_CARE_PROVIDER_SITE_OTHER): Payer: BLUE CROSS/BLUE SHIELD | Admitting: Internal Medicine

## 2016-05-17 ENCOUNTER — Encounter: Payer: Self-pay | Admitting: Internal Medicine

## 2016-05-17 ENCOUNTER — Ambulatory Visit (INDEPENDENT_AMBULATORY_CARE_PROVIDER_SITE_OTHER)
Admission: RE | Admit: 2016-05-17 | Discharge: 2016-05-17 | Disposition: A | Discharge status: Home / Self Care | Payer: BLUE CROSS/BLUE SHIELD | Source: Ambulatory Visit | Attending: Internal Medicine | Admitting: Internal Medicine

## 2016-05-17 VITALS — BP 130/90 | HR 79 | Ht 62.0 in | Wt 161.0 lb

## 2016-05-17 DIAGNOSIS — R05 Cough: Secondary | ICD-10-CM | POA: Diagnosis not present

## 2016-05-17 DIAGNOSIS — R058 Other specified cough: Secondary | ICD-10-CM

## 2016-05-17 DIAGNOSIS — J45991 Cough variant asthma: Secondary | ICD-10-CM

## 2016-05-17 MED ORDER — TRAMADOL HCL 50 MG PO TABS: ORAL_TABLET | ORAL | 0 refills | Status: DC

## 2016-05-17 MED ORDER — PREDNISONE 10 MG PO TABS: ORAL_TABLET | ORAL | 0 refills | Status: DC

## 2016-05-17 NOTE — Patient Instructions (Addendum)
Prednisone 10 mg take  4 each am x 2 days,   2 each am x 2 days,  1 each am x 2 days and stop   See calendar for specific medication instructions and bring it back for each and every office visit for every healthcare provider you see.  Without it,  you may not receive the best quality medical care that we feel you deserve.  You will note that the calendar groups together  your maintenance  medications that are timed at particular times of the day.  Think of this as your checklist for what your doctor has instructed you to do until your next evaluation to see what benefit  there is  to staying on a consistent group of medications intended to keep you well.  The other group at the bottom is entirely up to you to use as you see fit  for specific symptoms that may arise between visits that require you to treat them on an as needed basis.  Think of this as your action plan or "what if" list.   Separating the top medications from the bottom group is fundamental to providing you adequate care going forward.

## 2016-05-17 NOTE — Progress Notes (Signed)
Subjective:     Patient ID: Allison Gross, female   DOB: 1945-12-22   MRN: [id number]   Brief patient profile:  70 yowf quit light smoking 1973 with intermittent sinus/ bronchitis but no chronic c/o's or need for rx not much better after stopped smoking then much worse after around 2000 despite daily maint rx for asthma. Previously found to be allergic to dust, cats and trees and mold but no seasonal pattern and did not feel allergy shots helped in 1990s    History of Present Illness  09/21/2013 Acute OV  Complains of hoarseness, some increased SOB, low grade temp, dry hacking cough with occasional yellow mucus, some tightness x 1 week.  She denies any calf pain, edema, hemoptysis, orthopnea, PND, nausea, vomiting, diarrhea. Does feel, that she's had a low-grade fever. Returned yesterday from a 2 week cruise.  Says many people on the cruise were sick with cough and similar symptoms. Was in [employer].  Finished zpak on 6/3 and pred pak today by ships' physician. Has been using tramadol to today for cough with minimal help rec Levaquin 500mg  daily for 7 days  Prednisone taper over next week.  Delsym 2 tsp Twice daily   For cough  Tramadol 50mg  1 every 4hr as needed for breakthrough cough.  Mucinex Twice daily  As needed  Congestion . Saline nasal rinses As needed   Continue on Chlortrimeton 4mg  2 tabs At bedtime      04/26/2014 acute  ov/Allison Gross re: recurrent cough  Chief Complaint  Patient presents with  . Acute Visit    Pt states that her cough is no better since acute visit here on 04/22/14. Her wheezing seems to be getting worse- esp at night. She is using proair about 4 x per day, and duoneb 2-3 x per day.    10/22/13 -p xmas 2015 doing great on symb 80 2 bid , allegra daily, omeprazole 20 qam / h1 x 2 at hs but not h2, then acutely ill with productive cough / sore throat/ subj wheeze esp at hs  But fully ambulatory and note the saba not helping her "wheezing" even in neb form Tramadol x  one no better / tessilon no better, hycodan no better  rec In the the event of cough for any reason: Use the flutter valve as much as possible mucinex dm 1200 mg every 12 hours and supplement with 2 tramadol until no longer coughing And omeprazole 40 mg Take 30- 60 min before your first and last meals of the day and Pepcid 20 mg one at bedtime  For drainage take chlortrimeton (chlorpheniramine) 4 mg every 4 hours available over the counter (may cause drowsiness) Prednisone 10 mg take  4 each am x 2 days,   2 each am x 2 days,  1 each am x 2 days and stop  GERD   If not better see Allison Gross with all meds in hand  Add needs med calendar   07/11/2014 Follow up and med review  rec change chlortrimeton to prn /new med calendar    07/24/14 flare of pnds/cough clear mucus/ rx pred x 6 / did not use chloretrimeton at all    11/13/2015  f/u ov/Allison Gross re: chronic cough  Chief Complaint  Patient presents with  . Follow-up    Cough has improved. No new co's today.   only coughs in church     avg tramadol 1-2 per week  Not limited by breathing from desired activities  rec No change/ follow med calendar     05/17/2016  f/u ov/Allison Gross re: recurrent cough on symb 8- 2bid and using med calendar  Chief Complaint  Patient presents with  . Follow-up    Pt c/o increased cough x 2 wks.  She states she has been able to control the cough. Cough is occ prod with clear sputum.    typically worse p supper and before hs but doesn't wake her up noct New doe x one year but no problem with steps  Some better p saba but did not use on day of ov and spirometry here wnl (see a/p)    No obvious day to day or daytime variability or assoc  purulent sputum or mucus plugs or hemoptysis or cp or chest tightness, subjective wheeze or overt sinus or hb symptoms. No unusual exp hx or h/o childhood pna/ asthma or knowledge of premature birth.  Sleeping ok without nocturnal  or early am exacerbation  of respiratory  c/o's or  need for noct saba. Also denies any obvious fluctuation of symptoms with weather or environmental changes or other aggravating or alleviating factors except as outlined above   Current Medications, Allergies, Complete Past Medical History, Past Surgical History, Family History, and Social History were reviewed in [employer] record.  ROS  The following are not active complaints unless bolded sore throat, dysphagia, dental problems, itching, sneezing,  nasal congestion or excess/ purulent secretions, ear ache,   fever, chills, sweats, unintended wt loss, classically pleuritic or exertional cp,  orthopnea pnd or leg swelling, presyncope, palpitations, abdominal pain, anorexia, nausea, vomiting, diarrhea  or change in bowel or bladder habits, change in stools or urine, dysuria,hematuria,  rash, arthralgias, visual complaints, headache, numbness, weakness or ataxia or problems with walking or coordination,  change in mood/affect or memory.         Past Medical History:  Asthma  - HFA 50% November 10, 2009 > 75% December 24, 2009 > 90% March 26, 2010  Osteopenia  - hold fosfamax 09/2009 >> better with active HB so rec Reclast rx         Objective:  Physical Exam   07/14/2012   158 = baseline /   11/13/2014 162 > 02/14/2015   159 >  06/09/2015  157 > 11/13/2015  156 > 05/17/2016  161   Vital signs reviewed - Note on arrival 02 sats  100% on RA    GEN: A/Ox3; pleasant , NAD, elderly wf,   HEENT:  [place]/AT,  EACs-clear, TMs-wnl, NOSE-clear drainage  drainage THROAT-clear, no lesions,   No excess pnd   NECK:  Supple w/ fair ROM; no JVD; normal carotid impulses w/o bruits; no thyromegaly or nodules palpated; no lymphadenopathy.    RESP     no accessory muscle use,    CARD:  RRR, no m/r/g  , no peripheral edema, pulses intact, no cyanosis or clubbing.  GI:   Soft & nt; nml bowel sounds; no organomegaly or masses detected.   Musco: Warm bil, no deformities or joint swelling  noted.       CXR PA and Lateral:   05/17/2016 :    I personally reviewed images and agree with radiology impression as follows:    Mild interstitial prominence consistent with known reactive airway disease. No acute pneumonia.  Moderate-sized hiatal hernia -partially intrathoracic stomach.    Assessment:

## 2016-05-17 NOTE — Progress Notes (Signed)
Spoke with pt and notified of results per Dr. Wert. Pt verbalized understanding and denied any questions. 

## 2016-05-18 ENCOUNTER — Telehealth: Payer: Self-pay | Admitting: Internal Medicine

## 2016-05-18 NOTE — Telephone Encounter (Signed)
Notes Recorded by Tanda Rockers, MD on 05/17/2016 at 5:23 PM EST Call pt: Reviewed cxr and no acute change so no change in recommendations made at [facility] ----------  Spoke with pt, aware of results/recs.  Nothing further needed.

## 2016-05-18 NOTE — Progress Notes (Signed)
LMTCB

## 2016-05-19 ENCOUNTER — Ambulatory Visit (AMBULATORY_SURGERY_CENTER): Payer: Self-pay | Admitting: *Deleted

## 2016-05-19 VITALS — Ht 62.0 in | Wt 161.0 lb

## 2016-05-19 DIAGNOSIS — Z86010 Personal history of colonic polyps: Secondary | ICD-10-CM

## 2016-05-19 MED ORDER — NA SULFATE-K SULFATE-MG SULF 17.5-3.13-1.6 GM/177ML PO SOLN: ORAL | 0 refills | Status: DC

## 2016-05-19 NOTE — Progress Notes (Signed)
Patient denies any allergies to eggs or soy. Patient states ET tube used during ovary surgery was "too large" (size 7 1/2), made her throat close. Patient denies any oxygen use at home and does not take any diet/weight loss medications. EMMI education assisgned to patient on colonoscopy, this was explained and instructions given to patient.

## 2016-05-20 ENCOUNTER — Encounter: Payer: Self-pay | Admitting: Gastroenterology

## 2016-05-23 ENCOUNTER — Encounter: Payer: Self-pay | Admitting: Internal Medicine

## 2016-05-23 NOTE — Assessment & Plan Note (Addendum)
-   failed singulair around 2005  - allergy profile 08/14/2014 >  IgE  129 with pos RAST Dust >  Cat > dog  -med calendar 07/14/2015  - Spirometry 05/17/2016  wnl during flare of chronic cough   Nothing to suggest alternative dx here so no need for changing rx at this point

## 2016-05-23 NOTE — Assessment & Plan Note (Signed)
-   HFA 90% p coaching 04/23/11 - c/o hoarseness > added spacer 12/01/2012  > improved but stopped using it  - maint on symbicort 80 2bid  - try taper off gerd rx 11/13/14 > flared end of August 2016  - NO 05/21/2015  = 5  - Spirometry 05/17/2016  Nl p am symbicort 80 x 2 and doe reported    Not clear she's having any flare of asthma at this point but does have allergic hx so rec .Prednisone 10 mg take  4 each am x 2 days,   2 each am x 2 days,  1 each am x 2 days and stop but no change in chronic rx  I had an extended discussion with the patient reviewing all relevant studies completed to date and  lasting 15 to 20 minutes of a 25 minute visit    Each maintenance medication was reviewed in detail including most importantly the difference between maintenance and prns and under what circumstances the prns are to be triggered using an action plan format that is not reflected in the computer generated alphabetically organized AVS but trather by a customized med calendar that reflects the AVS meds with confirmed 100% correlation.   In addition, Please see AVS for unique instructions that I personally wrote and verbalized to the the pt in detail and then reviewed with pt  by my nurse highlighting any  changes in therapy recommended at today's visit to their plan of care.

## 2016-05-26 ENCOUNTER — Other Ambulatory Visit: Payer: Self-pay | Admitting: Internal Medicine

## 2016-05-26 MED ORDER — ALBUTEROL SULFATE HFA 108 (90 BASE) MCG/ACT IN AERS: INHALATION_SPRAY | RESPIRATORY_TRACT | 0 refills | Status: DC

## 2016-06-02 ENCOUNTER — Encounter: Payer: BLUE CROSS/BLUE SHIELD | Admitting: Gastroenterology

## 2016-06-22 ENCOUNTER — Other Ambulatory Visit: Payer: Self-pay | Admitting: Internal Medicine

## 2016-06-29 ENCOUNTER — Other Ambulatory Visit: Payer: Self-pay | Admitting: Internal Medicine

## 2016-06-29 DIAGNOSIS — R05 Cough: Secondary | ICD-10-CM

## 2016-06-29 DIAGNOSIS — R058 Other specified cough: Secondary | ICD-10-CM

## 2016-07-07 ENCOUNTER — Encounter: Payer: BLUE CROSS/BLUE SHIELD | Admitting: Gastroenterology

## 2016-07-19 ENCOUNTER — Telehealth: Payer: Self-pay | Admitting: Gastroenterology

## 2016-07-19 NOTE — Telephone Encounter (Signed)
Called pt.  Cough and cough suppressant OK.  Boiled egg OK the day before procedure (as long as before 9am). Angela/PV

## 2016-07-21 ENCOUNTER — Encounter: Payer: Self-pay | Admitting: Gastroenterology

## 2016-07-21 ENCOUNTER — Ambulatory Visit (AMBULATORY_SURGERY_CENTER): Payer: BLUE CROSS/BLUE SHIELD | Admitting: Gastroenterology

## 2016-07-21 VITALS — BP 136/68 | HR 81 | Temp 97.3°F | Resp 20 | Ht 62.0 in | Wt 161.0 lb

## 2016-07-21 DIAGNOSIS — Z86010 Personal history of colonic polyps: Secondary | ICD-10-CM

## 2016-07-21 DIAGNOSIS — D125 Benign neoplasm of sigmoid colon: Secondary | ICD-10-CM

## 2016-07-21 DIAGNOSIS — D124 Benign neoplasm of descending colon: Secondary | ICD-10-CM

## 2016-07-21 DIAGNOSIS — K635 Polyp of colon: Secondary | ICD-10-CM

## 2016-07-21 MED ORDER — SODIUM CHLORIDE 0.9 % IV SOLN: 500.0000 mL | INTRAVENOUS | Status: DC

## 2016-07-21 NOTE — Progress Notes (Signed)
Report to PACU, RN, vss, BBS= Clear.  

## 2016-07-21 NOTE — Progress Notes (Signed)
Pt's states no medical or surgical changes since previsit or office visit. 

## 2016-07-21 NOTE — Progress Notes (Signed)
Called to room to assist during endoscopic procedure.  Patient ID and intended procedure confirmed with present staff. Received instructions for my participation in the procedure from the performing physician.  

## 2016-07-21 NOTE — Op Note (Signed)
[facility] Patient Name: Allison Gross Procedure Date: 07/21/2016 12:20 PM MRN: [id number] Endoscopist: Meryl Dare , MD Age: 71 Referring MD:  Date of Birth: [birth year] Gender: Female Account #: [id number] Procedure:                Colonoscopy Indications:              Surveillance: Personal history of adenomatous                            polyps on last colonoscopy > 5 years ago Medicines:                Monitored Anesthesia Care Procedure:                Pre-Anesthesia Assessment:                           - Prior to the procedure, a History and Physical                            was performed, and patient medications and                            allergies were reviewed. The patient's tolerance of                            previous anesthesia was also reviewed. The risks                            and benefits of the procedure and the sedation                            options and risks were discussed with the patient.                            All questions were answered, and informed consent                            was obtained. Prior Anticoagulants: The patient has                            taken no previous anticoagulant or antiplatelet                            agents. ASA Grade Assessment: II - A patient with                            mild systemic disease. After reviewing the risks                            and benefits, the patient was deemed in                            satisfactory condition to undergo the procedure.  After obtaining informed consent, the colonoscope                            was passed under direct vision. Throughout the                            procedure, the patient's blood pressure, pulse, and                            oxygen saturations were monitored continuously. The                            Model PCF-H190DL [id number]) scope was introduced                            through the anus  and advanced to the the cecum,                            identified by appendiceal orifice and ileocecal                            valve. The ileocecal valve, appendiceal orifice,                            and rectum were photographed. The quality of the                            bowel preparation was excellent. The colonoscopy                            was performed without difficulty. The patient                            tolerated the procedure well. Scope In: 1:31:35 PM Scope Out: 1:47:47 PM Scope Withdrawal Time: 0 hours 13 minutes 32 seconds  Total Procedure Duration: 0 hours 16 minutes 12 seconds  Findings:                 The perianal and digital rectal examinations were                            normal.                           A 5 mm polyp was found in the descending colon. The                            polyp was sessile. The polyp was removed with a                            cold biopsy forceps. Resection and retrieval were                            complete.  Two sessile polyps were found in the sigmoid colon.                            The polyps were 6 to 7 mm in size. These polyps                            were removed with a cold snare. Resection and                            retrieval were complete.                           A few small-mouthed diverticula were found in the                            sigmoid colon.                           Internal hemorrhoids were found during                            retroflexion. The hemorrhoids were mild and Grade I                            (internal hemorrhoids that do not prolapse).                           The exam was otherwise without abnormality on                            direct and retroflexion views. Complications:            No immediate complications. Estimated blood loss:                            None. Estimated Blood Loss:     Estimated blood loss: none. Impression:                - One 5 mm polyp in the descending colon, removed                            with a cold biopsy forceps. Resected and retrieved.                           - Two 6 to 7 mm polyps in the sigmoid colon,                            removed with a cold snare. Resected and retrieved.                           - Diverticulosis in the sigmoid colon.                           - Internal hemorrhoids.                           -  The examination was otherwise normal on direct                            and retroflexion views. Recommendation:           - Repeat colonoscopy in 5 years for surveillance.                           - Patient has a contact number available for                            emergencies. The signs and symptoms of potential                            delayed complications were discussed with the                            patient. Return to normal activities tomorrow.                            Written discharge instructions were provided to the                            patient.                           - Resume previous diet.                           - Continue present medications.                           - Await pathology results. Meryl Dare, MD 07/21/2016 1:52:38 PM This report has been signed electronically.

## 2016-07-21 NOTE — Patient Instructions (Signed)
Handouts given on polyps and diverticulosis  YOU HAD AN ENDOSCOPIC PROCEDURE TODAY: Refer to the procedure report and other information in the discharge instructions given to you for any specific questions about what was found during the examination. If this information does not answer your questions, please call [place] office at [phone] to clarify.   YOU SHOULD EXPECT: Some feelings of bloating in the abdomen. Passage of more gas than usual. Walking can help get rid of the air that was put into your GI tract during the procedure and reduce the bloating. If you had a lower endoscopy (such as a colonoscopy or flexible sigmoidoscopy) you may notice spotting of blood in your stool or on the toilet paper. Some abdominal soreness may be present for a day or two, also.  DIET: Your first meal following the procedure should be a light meal and then it is ok to progress to your normal diet. A half-sandwich or bowl of soup is an example of a good first meal. Heavy or fried foods are harder to digest and may make you feel nauseous or bloated. Drink plenty of fluids but you should avoid alcoholic beverages for 24 hours. If you had a esophageal dilation, please see attached instructions for diet.    ACTIVITY: Your care partner should take you home directly after the procedure. You should plan to take it easy, moving slowly for the rest of the day. You can resume normal activity the day after the procedure however YOU SHOULD NOT DRIVE, use power tools, machinery or perform tasks that involve climbing or major physical exertion for 24 hours (because of the sedation medicines used during the test).   SYMPTOMS TO REPORT IMMEDIATELY: A gastroenterologist can be reached at any hour. Please call [phone]  for any of the following symptoms:  Following lower endoscopy (colonoscopy, flexible sigmoidoscopy) Excessive amounts of blood in the stool  Significant tenderness, worsening of abdominal pains  Swelling of  the abdomen that is new, acute  Fever of 100 or higher    FOLLOW UP:  If any biopsies were taken you will be contacted by phone or by letter within the next 1-3 weeks. Call [phone]  if you have not heard about the biopsies in 3 weeks.  Please also call with any specific questions about appointments or follow up tests.  

## 2016-07-22 ENCOUNTER — Telehealth: Payer: Self-pay | Admitting: *Deleted

## 2016-07-22 NOTE — Telephone Encounter (Signed)
  Follow up Call-  Call back number 07/21/2016  Post procedure Call Back phone  # [phone]  Permission to leave phone message Yes  Some recent data might be hidden     Patient questions:  Do you have a fever, pain , or abdominal swelling? No. Pain Score  0 *  Have you tolerated food without any problems? Yes.    Have you been able to return to your normal activities? Yes.    Do you have any questions about your discharge instructions: Diet   No. Medications  No. Follow up visit  No.  Do you have questions or concerns about your Care? No.  Actions: * If pain score is 4 or above: No action needed, pain <4.

## 2016-07-26 ENCOUNTER — Telehealth: Payer: Self-pay | Admitting: Internal Medicine

## 2016-07-26 DIAGNOSIS — R058 Other specified cough: Secondary | ICD-10-CM

## 2016-07-26 DIAGNOSIS — R05 Cough: Secondary | ICD-10-CM

## 2016-07-26 MED ORDER — PREDNISONE 10 MG PO TABS: ORAL_TABLET | ORAL | 0 refills | Status: DC

## 2016-07-26 NOTE — Telephone Encounter (Signed)
Spoke with the pt and notified of recs per MW  She verbalized understanding  Rx was sent and nothing further needed

## 2016-07-26 NOTE — Telephone Encounter (Signed)
Called and spoke to pt. Pt c/o prod cough with clear mucus, hoarseness and fatigue x 10 days. Pt states the mucus has been clear to white but this morning is the first morning she produced discolored sputum (green), this became clear throughout the day. Pt denies increase SOB, CP/tightness, f/c/s. Offered pt an appt, she declined and requests something be sent by phone.   Dr. Melvyn Novas please advise. Thanks.    Instructions from 1.29.18 Patient Instructions   Prednisone 10 mg take  4 each am x 2 days,   2 each am x 2 days,  1 each am x 2 days and stop    See calendar for specific medication instructions and bring it back for each and every office visit for every healthcare provider you see.  Without it,  you may not receive the best quality medical care that we feel you deserve.   You will note that the calendar groups together  your maintenance  medications that are timed at particular times of the day.  Think of this as your checklist for what your doctor has instructed you to do until your next evaluation to see what benefit  there is  to staying on a consistent group of medications intended to keep you well.  The other group at the bottom is entirely up to you to use as you see fit  for specific symptoms that may arise between visits that require you to treat them on an as needed basis.  Think of this as your action plan or "what if" list.    Separating the top medications from the bottom group is fundamental to providing you adequate care going forward.

## 2016-07-26 NOTE — Telephone Encounter (Signed)
Prednisone 10 mg take  4 each am x 2 days,   2 each am x 2 days,  1 each am x 2 days and stop   If following all the meds on the action plan and this doesn't work needs ov asap

## 2016-08-02 ENCOUNTER — Telehealth: Payer: Self-pay | Admitting: Internal Medicine

## 2016-08-02 ENCOUNTER — Ambulatory Visit (INDEPENDENT_AMBULATORY_CARE_PROVIDER_SITE_OTHER): Payer: BLUE CROSS/BLUE SHIELD | Admitting: Internal Medicine

## 2016-08-02 ENCOUNTER — Encounter: Payer: Self-pay | Admitting: Internal Medicine

## 2016-08-02 ENCOUNTER — Ambulatory Visit (INDEPENDENT_AMBULATORY_CARE_PROVIDER_SITE_OTHER)
Admission: RE | Admit: 2016-08-02 | Discharge: 2016-08-02 | Disposition: A | Discharge status: Home / Self Care | Payer: BLUE CROSS/BLUE SHIELD | Source: Ambulatory Visit | Attending: Internal Medicine | Admitting: Internal Medicine

## 2016-08-02 VITALS — BP 132/74 | HR 81 | Ht 62.0 in | Wt 160.8 lb

## 2016-08-02 DIAGNOSIS — R058 Other specified cough: Secondary | ICD-10-CM

## 2016-08-02 DIAGNOSIS — R05 Cough: Secondary | ICD-10-CM | POA: Diagnosis not present

## 2016-08-02 DIAGNOSIS — J45991 Cough variant asthma: Secondary | ICD-10-CM | POA: Diagnosis not present

## 2016-08-02 MED ORDER — AZITHROMYCIN 250 MG PO TABS: ORAL_TABLET | ORAL | 0 refills | Status: DC

## 2016-08-02 MED ORDER — GABAPENTIN 100 MG PO CAPS: 100.0000 mg | ORAL_CAPSULE | Freq: Three times a day (TID) | ORAL | 2 refills | Status: DC

## 2016-08-02 MED ORDER — PREDNISONE 10 MG PO TABS: ORAL_TABLET | ORAL | 0 refills | Status: DC

## 2016-08-02 MED ORDER — TRAMADOL HCL 50 MG PO TABS
ORAL_TABLET | ORAL | 0 refills | Status: DC
Start: 2016-08-02 — End: 2017-06-13

## 2016-08-02 NOTE — Telephone Encounter (Signed)
Spoke with pt, who reports of prod cough with white mucus, wheezing and chest tightness x2w Pt finished prednisone taper that was MW prescribed on 07/26/16, with no relief.  Pt scheduled for acute visit at 11:30 with MW. Nothing further needed.

## 2016-08-02 NOTE — Progress Notes (Signed)
Subjective:     Patient ID: Allison Gross, female   DOB: 04-29-1945   MRN: [id number]   Brief patient profile:  8 yowf quit light smoking 1973 with intermittent sinus/ bronchitis but no chronic c/o's or need for rx not much better after stopped smoking then much worse after around 2000 despite daily maint rx for asthma. Previously found to be allergic to dust, cats and trees and mold but no seasonal pattern and did not feel allergy shots helped in 1990s    History of Present Illness  09/21/2013 Acute OV  Complains of hoarseness, some increased SOB, low grade temp, dry hacking cough with occasional yellow mucus, some tightness x 1 week.  She denies any calf pain, edema, hemoptysis, orthopnea, PND, nausea, vomiting, diarrhea. Does feel, that she's had a low-grade fever. Returned yesterday from a 2 week cruise.  Says many people on the cruise were sick with cough and similar symptoms. Was in [employer].  Finished zpak on 6/3 and pred pak today by ships' physician. Has been using tramadol to today for cough with minimal help rec Levaquin 500mg  daily for 7 days  Prednisone taper over next week.  Delsym 2 tsp Twice daily   For cough  Tramadol 50mg  1 every 4hr as needed for breakthrough cough.  Mucinex Twice daily  As needed  Congestion . Saline nasal rinses As needed   Continue on Chlortrimeton 4mg  2 tabs At bedtime      04/26/2014 acute  ov/Wert re: recurrent cough  Chief Complaint  Patient presents with  . Acute Visit    Pt states that her cough is no better since acute visit here on 04/22/14. Her wheezing seems to be getting worse- esp at night. She is using proair about 4 x per day, and duoneb 2-3 x per day.    10/22/13 -p xmas 2015 doing great on symb 80 2 bid , allegra daily, omeprazole 20 qam / h1 x 2 at hs but not h2, then acutely ill with productive cough / sore throat/ subj wheeze esp at hs  But fully ambulatory and note the saba not helping her "wheezing" even in neb form Tramadol x  one no better / tessilon no better, hycodan no better  rec In the the event of cough for any reason: Use the flutter valve as much as possible mucinex dm 1200 mg every 12 hours and supplement with 2 tramadol until no longer coughing And omeprazole 40 mg Take 30- 60 min before your first and last meals of the day and Pepcid 20 mg one at bedtime  For drainage take chlortrimeton (chlorpheniramine) 4 mg every 4 hours available over the counter (may cause drowsiness) Prednisone 10 mg take  4 each am x 2 days,   2 each am x 2 days,  1 each am x 2 days and stop  GERD   If not better see [name] with all meds in hand  Add needs med calendar   07/11/2014 Follow up and med review  rec change chlortrimeton to prn /new med calendar    07/24/14 flare of pnds/cough clear mucus/ rx pred x 6 / did not use chloretrimeton at all    11/13/2015  f/u ov/Wert re: chronic cough  Chief Complaint  Patient presents with  . Follow-up    Cough has improved. No new co's today.   only coughs in church     avg tramadol 1-2 per week  Not limited by breathing from desired activities  rec No change/ follow med calendar     05/17/2016  f/u ov/Wert re: recurrent cough on symb 8- 2bid and using med calendar  Chief Complaint  Patient presents with  . Follow-up    Pt c/o increased cough x 2 wks.  She states she has been able to control the cough. Cough is occ prod with clear sputum.    typically worse p supper and before hs but doesn't wake her up noct New doe x one year but no problem with steps  Some better p saba but did not use on day of ov and spirometry here wnl (see a/p)  rec Prednisone 10 mg take  4 each am x 2 days,   2 each am x 2 days,  1 each am x 2 days and stop     08/02/2016 acute extended ov/Wert  Flare of cough since Easter Sunday 2018 while maintained on symb 80 2bid Chief Complaint  Patient presents with  . Acute Visit    Pt here today c/o lots of coughing with white mucus, wheezing and chest  tightness,she finished the prednisone   using proair once or twice daily seems to help only a little  Doing well at hs until 2 nights prior to OV  Now also lots of noct cough /wheeze Mucus is thick, not able to take mucinex dm/ flutter not helping    No obvious patterns in  day to day or daytime variability or assoc  purulent sputum or mucus plugs or hemoptysis or cp  or overt sinus or hb symptoms. No unusual exp hx or h/o childhood pna/ asthma or knowledge of premature birth.  Sleeping ok without nocturnal  or early am exacerbation  of respiratory  c/o's or need for noct saba. Also denies any obvious fluctuation of symptoms with weather or environmental changes or other aggravating or alleviating factors except as outlined above   Current Medications, Allergies, Complete Past Medical History, Past Surgical History, Family History, and Social History were reviewed in [employer] record.  ROS  The following are not active complaints unless bolded sore throat, dysphagia, dental problems, itching, sneezing,  nasal congestion or excess/ purulent secretions, ear ache,   fever, chills, sweats, unintended wt loss, classically pleuritic or exertional cp,  orthopnea pnd or leg swelling, presyncope, palpitations, abdominal pain, anorexia, nausea, vomiting, diarrhea  or change in bowel or bladder habits, change in stools or urine, dysuria,hematuria,  rash, arthralgias, visual complaints, headache, numbness, weakness or ataxia or problems with walking or coordination,  change in mood/affect or memory.                      Past Medical History:  Asthma  - HFA 50% November 10, 2009 > 75% December 24, 2009 > 90% March 26, 2010  Osteopenia  - hold fosfamax 09/2009 >> better with active HB so rec Reclast rx         Objective:  Physical Exam   07/14/2012   158 = baseline /   11/13/2014 162 > 02/14/2015   159 >  06/09/2015  157 > 11/13/2015  156 > 05/17/2016  161 > 08/02/2016  161     Vital signs reviewed - Note on arrival 02 sats  99% RA    GEN: A/Ox3; pleasant , NAD, elderly wf, harsh barking quality upper airway cough with prominent pseudowheeze  HEENT:  [place]/AT,  EACs-clear, TMs-wnl, NOSE-clear drainage  drainage THROAT-clear, no lesions,   No excess pnd  NECK:  Supple w/ fair ROM; no JVD; normal carotid impulses w/o bruits; no thyromegaly or nodules palpated; no lymphadenopathy.    RESP     no accessory muscle use,  Completely clear to A and P  CARD:  RRR, no m/r/g  , no peripheral edema, pulses intact, no cyanosis or clubbing.  GI:   Soft & nt; nml bowel sounds; no organomegaly or masses detected.   Musco: Warm bil, no deformities or joint swelling noted.     CXR PA and Lateral:   08/02/2016 :    I personally reviewed images and agree with radiology impression as follows:    Mild cardiomegaly. Mild chronic interstitial changes and mild bibasilar atelectasis.  Moderate hiatal hernia.       Assessment:

## 2016-08-02 NOTE — Patient Instructions (Addendum)
zpak  Prednisone 10 mg take  4 each am x 2 days,   2 each am x 2 days,  1 each am x 2 days and stop  If can't take mucinex dm then take mucinex up to 1200 mg every 12 hours as needed and the flutter valve as much as you possibly  can  For cough flares in the future > immediately start the gabapentin 100 mg three time daily until not needing your other action plans   See [name] w/in 2 weeks with all your medications, even over the counter meds, separated in two separate bags, the ones you take no matter what vs the ones you stop once you feel better and take only as needed when you feel you need them.   Tammy  will generate for you a new user friendly medication calendar that will put [place] all on the same page re: your medication use.     Without this process, it simply isn't possible to assure that we are providing  your outpatient care  with  the attention to detail we feel you deserve.   If we cannot assure that you're getting that kind of care,  then we cannot manage your problem effectively from this clinic.  Once you have seen Tammy and we are sure that we're all on the same page with your medication use she will arrange follow up with me.   Please remember to go to the   x-ray department downstairs in the basement  for your tests - we will call you with the results when they are available.

## 2016-08-03 NOTE — Progress Notes (Signed)
Spoke with pt and notified of results per Dr. Wert. Pt verbalized understanding and denied any questions. 

## 2016-08-04 NOTE — Assessment & Plan Note (Signed)
-   HFA 90% p coaching 04/23/11 - c/o hoarseness > added spacer 12/01/2012  > improved but stopped using it  - maint on symbicort 80 2bid  - try taper off gerd rx 11/13/14 > flared end of August 2016  - NO 05/21/2015  = 5  - Spirometry 05/17/2016  Nl p am symbicort 80 x 2 and doe reported    Despite refractory severe symptoms her exam is normal today strongly indicating the asthmatic component her problem is adequately controlled on low-dose Symbicort. No changes were therefore made in asthma meds

## 2016-08-04 NOTE — Assessment & Plan Note (Addendum)
-   failed singulair around 2005  - allergy profile 08/14/2014 >  IgE  129 with pos RAST Dust >  Cat > dog  -med calendar 07/14/2015  - Spirometry 05/17/2016  wnl during flare of chronic cough - gabapentin 100 mg tid as part of action plan rec 08/02/2016   Her symptoms today appear to be entirely upper airway and should be controllable with the addition of gabapentin as part of her action plan as outlined above and rx with zpak/ pred x6 days only added to cover any superimposed component of upper respiratory tract infection that may have occurred over the last several days prior to her visit.  Of the three most common causes of  Sub-acute or recurrent or chronic cough, only one (GERD)  can actually contribute to/ trigger  the other two (asthma and post nasal drip syndrome)  and perpetuate the cylce of cough.  While not intuitively obvious, many patients with chronic low grade reflux do not cough until there is a primary insult that disturbs the protective epithelial barrier and exposes sensitive nerve endings.   This is typically viral but can be direct physical injury such as with an endotracheal tube.   The point is that once this occurs, it is difficult to eliminate the cycle  using anything but a maximally effective acid suppression regimen at least in the short run, accompanied by an appropriate diet to address non acid GERD and control / eliminate the cough itself for at least 3 days.   I had an extended discussion with the patient reviewing all relevant studies completed to date and  lasting 15 to 20 minutes of a 25 minute visit    Each maintenance medication was reviewed in detail including most importantly the difference between maintenance and prns and under what circumstances the prns are to be triggered using an action plan format that is not reflected in the computer generated alphabetically organized AVS but trather by a customized med calendar that reflects the AVS meds with confirmed 100%  correlation.   In addition, Please see AVS for unique instructions that I personally wrote and verbalized to the the pt in detail and then reviewed with pt  by my nurse highlighting any  changes in therapy recommended at today's visit to their plan of care.

## 2016-08-05 ENCOUNTER — Encounter: Payer: Self-pay | Admitting: Gastroenterology

## 2016-08-16 ENCOUNTER — Ambulatory Visit (INDEPENDENT_AMBULATORY_CARE_PROVIDER_SITE_OTHER): Payer: BLUE CROSS/BLUE SHIELD | Admitting: Adult Health

## 2016-08-16 ENCOUNTER — Encounter: Payer: Self-pay | Admitting: Adult Health

## 2016-08-16 DIAGNOSIS — J45991 Cough variant asthma: Secondary | ICD-10-CM

## 2016-08-16 NOTE — Assessment & Plan Note (Signed)
Recent flare with bronchitis , much improved .  Cont on current regimen  Follow up with Dr. Melvyn Novas  In 4 months and As needed

## 2016-08-16 NOTE — Progress Notes (Signed)
Chart and office note reviewed in detail  > agree with a/p as outlined    

## 2016-08-16 NOTE — Patient Instructions (Signed)
Continue on current regimen .  Follow up Dr. Melvyn Novas  In 4 months and As needed

## 2016-08-16 NOTE — Progress Notes (Signed)
@Patient  ID: Allison Gross, female    DOB: [birth year], 71 y.o.   MRN: [id number]  Chief Complaint  Patient presents with  . Follow-up    Cough    Referring provider: Nyoka Cowden, MD  HPI: 71 yo female former smoker followed for recurrent cough vs cough variant asthma   TEST  NO 05/21/2015  = 5  - Spirometry 05/17/2016  Nl p am symbicort 80 x 2 and doe reported    08/16/2016 Follow up : Cough variant asthma  Pt returns for 2 week follow up . She was seen last ov with Asthmatic bronchitis exacerbation . She was treated with Zpack and prednisone taper . She returns feeling much better. Feels she has returned back to normal . She did take Gabapentin for cough control for few days but gave her a headache so she stopped. Cough is better. Denies wheezing .  She is ready to start back playing golf., plays every week on Tuesday with group of friends.     Allergies  Allergen Reactions  . Sulfonamide Derivatives Nausea And Vomiting    Immunization History  Administered Date(s) Administered  . Influenza Split 02/10/2011, 01/18/2015, 01/18/2016  . Influenza Whole 01/18/2012, 01/27/2012  . Influenza,inj,Quad PF,36+ Mos 01/03/2013  . Influenza-Unspecified 01/17/2014  . Pneumococcal Polysaccharide-23 01/18/2007    Past Medical History:  Diagnosis Date  . Asthma   . Cancer (HCC)    basil,squamous  . Hypertension   . Osteopenia   . Seizures (HCC) 15 years ago   1982's from childbirth    Tobacco History: History  Smoking Status  . Former Smoker  . Packs/day: 0.10  . Years: 5.00  . Types: Cigarettes  . Quit date: 04/19/1984  Smokeless Tobacco  . Never Used    Comment: reports smoked socially   Counseling given: Not Answered   Outpatient Encounter Prescriptions as of 08/16/2016  Medication Sig  . albuterol (PROAIR HFA) 108 (90 Base) MCG/ACT inhaler INHALE 2 PUFFS EVERY 4 HOURS AS NEEDED FOR WHEEZING OR SHORTNESS OF BREATH  . budesonide (RHINOCORT AQUA) 32 MCG/ACT nasal  spray Place 2 sprays into both nostrils daily.  . calcium-vitamin D (OSCAL WITH D) 500-200 MG-UNIT per tablet Take 1 tablet by mouth daily with breakfast.   . carbamazepine (TEGRETOL XR) 200 MG 12 hr tablet Take 200 mg by mouth 2 (two) times daily.    . chlorpheniramine (CHLOR-TRIMETON) 4 MG tablet Take 4 mg by mouth every 4 (four) hours as needed (drippy nose, drainage, throat clearing).   . famotidine (PEPCID) 20 MG tablet Take 20 mg by mouth at bedtime.  . fexofenadine (ALLEGRA) 180 MG tablet Take 180 mg by mouth every morning.   . gabapentin (NEURONTIN) 100 MG capsule Take 1 capsule (100 mg total) by mouth 3 (three) times daily. One three times daily  . Guaifenesin (MUCINEX MAXIMUM STRENGTH) 1200 MG TB12 Take 1 tablet by mouth 2 (two) times daily as needed.   Marland Kitchen ibuprofen (ADVIL,MOTRIN) 400 MG tablet Take 400 mg by mouth every 8 (eight) hours as needed (joint pain).   Marland Kitchen ipratropium-albuterol (DUONEB) 0.5-2.5 (3) MG/3ML SOLN Take 3 mLs by nebulization every 4 (four) hours as needed (Wheezing/shortness of breath ((PLAN C))).   Marland Kitchen losartan-hydrochlorothiazide (HYZAAR) 50-12.5 MG per tablet Take 1 tablet by mouth every morning.   . Melatonin 3 MG TABS Take 1 tablet by mouth at bedtime.   . Multiple Vitamin (MULTIVITAMIN) tablet Take 1 tablet by mouth every morning.   . pantoprazole (PROTONIX) 40  MG tablet TAKE 1 TABLET BY MOUTH EVERY DAY 30 TO 60 MINUTES BEFORE FIRST MEAL OF THE DAY  . potassium chloride (K-DUR) 10 MEQ tablet Take 1 tablet by mouth daily. ((2 tabs on Mondays and Fridays))  . Respiratory Therapy Supplies (FLUTTER) DEVI As instructed  . SYMBICORT 80-4.5 MCG/ACT inhaler INHALE 2 PUFFS FIRST THING IN THE MORNING AND 2 PUFFS 12 HOURS LATER  . traMADol (ULTRAM) 50 MG tablet 1-2 every 4 hours as needed for cough or pain  . Turmeric 500 MG CAPS Take 1 capsule by mouth every morning.   . vitamin B-12 (CYANOCOBALAMIN) 500 MCG tablet Take 500 mcg by mouth every morning.   . vitamin C  (ASCORBIC ACID) 500 MG tablet Take 500 mg by mouth every morning.   . [DISCONTINUED] azithromycin (ZITHROMAX) 250 MG tablet Take 2 on day one then 1 daily x 4 days (Patient not taking: Reported on 08/16/2016)  . [DISCONTINUED] predniSONE (DELTASONE) 10 MG tablet Take  4 each am x 2 days,   2 each am x 2 days,  1 each am x 2 days and stop (Patient not taking: Reported on 08/16/2016)   Facility-Administered Encounter Medications as of 08/16/2016  Medication  . 0.9 %  sodium chloride infusion     Review of Systems  Constitutional:   No  weight loss, night sweats,  Fevers, chills, fatigue, or  lassitude.  HEENT:   No headaches,  Difficulty swallowing,  Tooth/dental problems, or  Sore throat,                No sneezing, itching, ear ache,  +nasal congestion, post nasal drip,   CV:  No chest pain,  Orthopnea, PND, swelling in lower extremities, anasarca, dizziness, palpitations, syncope.   GI  No heartburn, indigestion, abdominal pain, nausea, vomiting, diarrhea, change in bowel habits, loss of appetite, bloody stools.   Resp: No shortness of breath with exertion or at rest.  No excess mucus, no productive cough,  No non-productive cough,  No coughing up of blood.  No change in color of mucus.  No wheezing.  No chest wall deformity  Skin: no rash or lesions.  GU: no dysuria, change in color of urine, no urgency or frequency.  No flank pain, no hematuria   MS:  No joint pain or swelling.  No decreased range of motion.  No back pain.    Physical Exam  BP 116/82 (BP Location: Left Arm, Cuff Size: Normal)   Pulse 74   Ht 5\' 2"  (1.575 m)   Wt 162 lb 12.8 oz (73.8 kg)   SpO2 100%   BMI 29.78 kg/m   GEN: A/Ox3; pleasant , NAD, elderly    HEENT:  [place]/AT,  EACs-clear, TMs-wnl, NOSE-clear, THROAT-clear, no lesions, no postnasal drip or exudate noted.   NECK:  Supple w/ fair ROM; no JVD; normal carotid impulses w/o bruits; no thyromegaly or nodules palpated; no lymphadenopathy.    RESP   Clear  P & A; w/o, wheezes/ rales/ or rhonchi. no accessory muscle use, no dullness to percussion  CARD:  RRR, no m/r/g, no peripheral edema, pulses intact, no cyanosis or clubbing.  GI:   Soft & nt; nml bowel sounds; no organomegaly or masses detected.   Musco: Warm bil, no deformities or joint swelling noted.   Neuro: alert, no focal deficits noted.    Skin: Warm, no lesions or rashes    Lab Results:  CBC  BMET No results found for: NA, K, CL, CO2,  GLUCOSE, BUN, CREATININE, CALCIUM, GFRNONAA, GFRAA  BNP No results found for: BNP  ProBNP No results found for: PROBNP  Imaging: Dg Chest 2 View  Result Date: 08/02/2016 CLINICAL DATA:  Cough and congestion for 2 weeks. History of asthma, ex smoker. EXAM: CHEST  2 VIEW COMPARISON:  Chest radiograph May 17, 2016 FINDINGS: The cardiac silhouette is mildly enlarged unchanged. Mildly calcified aortic knob. Similar mild chronic interstitial changes with bibasilar strandy densities. No pleural effusion or focal consolidation. No pneumothorax. Moderate hiatal hernia. Lower thoracic broad dextroscoliosis and osteopenia. Surgical clips in the included right abdomen compatible with cholecystectomy. IMPRESSION: Mild cardiomegaly. Mild chronic interstitial changes and mild bibasilar atelectasis. Moderate hiatal hernia. Electronically Signed   By: Awilda Metro M.D.   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  Cont on current regimen  Follow up with Dr. Sherene Sires  In 4 months and As needed        Rubye Oaks, NP 08/16/2016

## 2016-08-26 ENCOUNTER — Other Ambulatory Visit: Payer: Self-pay | Admitting: Internal Medicine

## 2016-08-26 MED ORDER — BUDESONIDE 32 MCG/ACT NA SUSP: 2.0000 | Freq: Every day | NASAL | 11 refills | Status: DC

## 2016-10-18 ENCOUNTER — Other Ambulatory Visit: Payer: Self-pay | Admitting: Internal Medicine

## 2016-10-18 MED ORDER — MOMETASONE FUROATE 50 MCG/ACT NA SUSP: 2.0000 | Freq: Every day | NASAL | 11 refills | Status: DC

## 2016-12-01 ENCOUNTER — Other Ambulatory Visit: Payer: Self-pay | Admitting: Internal Medicine

## 2016-12-01 DIAGNOSIS — R058 Other specified cough: Secondary | ICD-10-CM

## 2016-12-01 DIAGNOSIS — R05 Cough: Secondary | ICD-10-CM

## 2016-12-16 ENCOUNTER — Ambulatory Visit (INDEPENDENT_AMBULATORY_CARE_PROVIDER_SITE_OTHER): Payer: BLUE CROSS/BLUE SHIELD | Admitting: Internal Medicine

## 2016-12-16 ENCOUNTER — Encounter: Payer: Self-pay | Admitting: Internal Medicine

## 2016-12-16 ENCOUNTER — Other Ambulatory Visit: Payer: Self-pay | Admitting: Internal Medicine

## 2016-12-16 VITALS — BP 130/74 | HR 72 | Ht 62.0 in | Wt 158.0 lb

## 2016-12-16 DIAGNOSIS — R05 Cough: Secondary | ICD-10-CM

## 2016-12-16 DIAGNOSIS — J45991 Cough variant asthma: Secondary | ICD-10-CM

## 2016-12-16 DIAGNOSIS — R058 Other specified cough: Secondary | ICD-10-CM

## 2016-12-16 NOTE — Progress Notes (Signed)
Subjective:     Patient ID: Allison Gross, female   DOB: 07-29-1945   MRN: [id number]   Brief patient profile:  38 yowf quit light smoking 1973 with intermittent sinus/ bronchitis but no chronic c/o's or need for rx not much better after stopped smoking then much worse after around 2000 despite daily maint rx for asthma. Previously found to be allergic to dust, cats and trees and mold but no seasonal pattern and did not feel allergy shots helped in 1990s    History of Present Illness  09/21/2013 Acute OV  Complains of hoarseness, some increased SOB, low grade temp, dry hacking cough with occasional yellow mucus, some tightness x 1 week.  She denies any calf pain, edema, hemoptysis, orthopnea, PND, nausea, vomiting, diarrhea. Does feel, that she's had a low-grade fever. Returned yesterday from a 2 week cruise.  Says many people on the cruise were sick with cough and similar symptoms. Was in [place].  Finished zpak on 6/3 and pred pak today by ships' physician. Has been using tramadol to today for cough with minimal help rec Levaquin 500mg  daily for 7 days  Prednisone taper over next week.  Delsym 2 tsp Twice daily   For cough  Tramadol 50mg  1 every 4hr as needed for breakthrough cough.  Mucinex Twice daily  As needed  Congestion . Saline nasal rinses As needed   Continue on Chlortrimeton 4mg  2 tabs At bedtime      04/26/2014 acute  ov/Allison Gross re: recurrent cough  Chief Complaint  Patient presents with  . Acute Visit    Pt states that her cough is no better since acute visit here on 04/22/14. Her wheezing seems to be getting worse- esp at night. She is using proair about 4 x per day, and duoneb 2-3 x per day.    10/22/13 -p xmas 2015 doing great on symb 80 2 bid , allegra daily, omeprazole 20 qam / h1 x 2 at hs but not h2, then acutely ill with productive cough / sore throat/ subj wheeze esp at hs  But fully ambulatory and note the saba not helping her "wheezing" even in neb form Tramadol x  one no better / tessilon no better, hycodan no better  rec In the the event of cough for any reason: Use the flutter valve as much as possible mucinex dm 1200 mg every 12 hours and supplement with 2 tramadol until no longer coughing And omeprazole 40 mg Take 30- 60 min before your first and last meals of the day and Pepcid 20 mg one at bedtime  For drainage take chlortrimeton (chlorpheniramine) 4 mg every 4 hours available over the counter (may cause drowsiness) Prednisone 10 mg take  4 each am x 2 days,   2 each am x 2 days,  1 each am x 2 days and stop  GERD   If not better see [name] with all meds in hand  Add needs med calendar   07/11/2014 Follow up and med review  rec change chlortrimeton to prn /new med calendar    07/24/14 flare of pnds/cough clear mucus/ rx pred x 6 / did not use chloretrimeton at all    11/13/2015  f/u ov/Allison Gross re: chronic cough  Chief Complaint  Patient presents with  . Follow-up    Cough has improved. No new co's today.   only coughs in church     avg tramadol 1-2 per week  Not limited by breathing from desired activities  rec No change/ follow med calendar     05/17/2016  f/u ov/Allison Gross re: recurrent cough on symb 8- 2bid and using med calendar  Chief Complaint  Patient presents with  . Follow-up    Pt c/o increased cough x 2 wks.  She states she has been able to control the cough. Cough is occ prod with clear sputum.    typically worse p supper and before hs but doesn't wake her up noct New doe x one year but no problem with steps  Some better p saba but did not use on day of ov and spirometry here wnl (see a/p)  rec Prednisone 10 mg take  4 each am x 2 days,   2 each am x 2 days,  1 each am x 2 days and stop     08/02/2016 acute extended ov/Allison Gross  Flare of cough since Easter Sunday 2018 while maintained on symb 80 2bid Chief Complaint  Patient presents with  . Acute Visit    Pt here today c/o lots of coughing with white mucus, wheezing and chest  tightness,she finished the prednisone   using proair once or twice daily seems to help only a little  Doing well at hs until 2 nights prior to OV  Now also lots of noct cough /wheeze Mucus is thick, not able to take mucinex dm/ flutter not helping  rec zpak  Prednisone 10 mg take  4 each am x 2 days,   2 each am x 2 days,  1 each am x 2 days and stop  If can't take mucinex dm then take mucinex up to 1200 mg every 12 hours as needed and the flutter valve as much as you possibly  can For cough flares in the future > immediately start the gabapentin 100 mg three time daily until not needing your other action plans     12/16/2016  f/u ov/Allison Gross re:  Chronic cough  Chief Complaint  Patient presents with  . Follow-up    Pt states cough is "ok I have not had any trouble at all". She does c/o hoarseness. She has used her albuterol 2-3 x since last visit. She has not needed neb.   no longer taking any gerd rx/ just sybm 80 2bid and nasal steroids Exp to outdoor cat s flare Has not needed the gabapentin Not limited by breathing from desired activities     No obvious day to day or daytime variability or assoc excess/ purulent sputum or mucus plugs or hemoptysis or cp or chest tightness, subjective wheeze or overt sinus or hb symptoms. No unusual exp hx or h/o childhood pna/ asthma or knowledge of premature birth.  Sleeping ok without nocturnal  or early am exacerbation  of respiratory  c/o's or need for noct saba. Also denies any obvious fluctuation of symptoms with weather or environmental changes or other aggravating or alleviating factors except as outlined above   Current Medications, Allergies, Complete Past Medical History, Past Surgical History, Family History, and Social History were reviewed in [employer] [facility] record.  ROS  The following are not active complaints unless bolded sore throat, dysphagia, dental problems, itching, sneezing,  nasal congestion or excess/ purulent  secretions, ear ache,   fever, chills, sweats, unintended wt loss, classically pleuritic or exertional cp,  orthopnea pnd or leg swelling, presyncope, palpitations, abdominal pain, anorexia, nausea, vomiting, diarrhea  or change in bowel or bladder habits, change in stools or urine, dysuria,hematuria,  rash, arthralgias, visual complaints,  headache, numbness, weakness or ataxia or problems with walking or coordination,  change in mood/affect or memory.                         Past Medical History:  Asthma  - HFA 50% November 10, 2009 > 75% December 24, 2009 > 90% March 26, 2010  Osteopenia  - hold fosfamax 09/2009 >> better with active HB so rec Reclast rx         Objective:  Physical Exam   07/14/2012   158 = baseline /   11/13/2014 162 > 02/14/2015   159 >  06/09/2015  157 > 11/13/2015  156 > 05/17/2016  161 > 08/02/2016  161 >  12/16/2016  158    Vital signs reviewed   - Note on arrival 02 sats  100% on RA    HEENT: nl dentition,  and oropharynx. Nl external ear canals without cough reflex - mild bilateral non-specific turbinate edema     NECK :  without JVD/Nodes/TM/ nl carotid upstrokes bilaterally   LUNGS: no acc muscle use,  Nl contour chest which is clear to A and P bilaterally without cough on insp or exp maneuvers   CV:  RRR  no s3 or murmur or increase in P2, and no edema   ABD:  soft and nontender with nl inspiratory excursion in the supine position. No bruits or organomegaly appreciated, bowel sounds nl  MS:  Nl gait/ ext warm without deformities, calf tenderness, cyanosis or clubbing No obvious joint restrictions   SKIN: warm and dry without lesions    NEURO:  alert, approp, nl sensorium with  no motor or cerebellar deficits apparent.       CXR PA and Lateral:   08/02/2016 :    I personally reviewed images and agree with radiology impression as follows:    Mild cardiomegaly. Mild chronic interstitial changes and mild bibasilar atelectasis.  Moderate hiatal  hernia.       Assessment:

## 2016-12-16 NOTE — Assessment & Plan Note (Signed)
-   failed singulair around 2005  - allergy profile 08/14/2014 >  IgE  129 with pos RAST Dust >  Cat > dog  -med calendar 07/14/2015  - Spirometry 05/17/2016  wnl during flare of chronic cough - gabapentin 100 mg tid as part of action plan rec 08/02/2016   - off maint gerd rx 12/16/2016 s flare   Finally have good control of the cough and minimal need for action plan meds but emphasized likelihood of flare based on pmh   I had an extended discussion with the patient reviewing all relevant studies completed to date and  lasting 15 to 20 minutes of a 25 minute visit    Each maintenance medication was reviewed in detail including most importantly the difference between maintenance and prns and under what circumstances the prns are to be triggered using an action plan format that is not reflected in the computer generated alphabetically organized AVS but trather by a customized med calendar that reflects the AVS meds with confirmed 100% correlation.   In addition, Please see AVS for unique instructions that I personally wrote and verbalized to the the pt in detail and then reviewed with pt  by my nurse highlighting any  changes in therapy recommended at today's visit to their plan of care.

## 2016-12-16 NOTE — Assessment & Plan Note (Signed)
-  HFA 90% p coaching 04/23/11 - c/o hoarseness > added spacer 12/01/2012  > improved but stopped using it  - maint on symbicort 80 2bid  - try taper off gerd rx 11/13/14 > flared end of August 2016  - NO 05/21/2015  = 5  - Spirometry 05/17/2016  Nl p am symbicort 80 x 2 and doe reported    All goals of chronic asthma control met including optimal function and elimination of symptoms with minimal need for rescue therapy.  Contingencies discussed in full including contacting this office immediately if not controlling the symptoms using the rule of two's.

## 2016-12-16 NOTE — Patient Instructions (Addendum)
If cough persists more than 3 days on your action plan (which includes tramadol)  then start the gabapentin 100 mg three times daily   Follow med calendar and bring back to each office visit   Please schedule a follow up visit in 6  months but call sooner if needed

## 2016-12-22 ENCOUNTER — Other Ambulatory Visit: Payer: Self-pay | Admitting: Internal Medicine

## 2017-06-13 ENCOUNTER — Ambulatory Visit (INDEPENDENT_AMBULATORY_CARE_PROVIDER_SITE_OTHER): Payer: BLUE CROSS/BLUE SHIELD | Admitting: Internal Medicine

## 2017-06-13 ENCOUNTER — Other Ambulatory Visit (INDEPENDENT_AMBULATORY_CARE_PROVIDER_SITE_OTHER): Payer: BLUE CROSS/BLUE SHIELD

## 2017-06-13 ENCOUNTER — Telehealth: Payer: Self-pay | Admitting: Internal Medicine

## 2017-06-13 ENCOUNTER — Encounter: Payer: Self-pay | Admitting: Internal Medicine

## 2017-06-13 VITALS — BP 124/72 | HR 76 | Ht 62.0 in | Wt 160.0 lb

## 2017-06-13 DIAGNOSIS — R05 Cough: Secondary | ICD-10-CM | POA: Diagnosis not present

## 2017-06-13 DIAGNOSIS — R0609 Other forms of dyspnea: Secondary | ICD-10-CM

## 2017-06-13 DIAGNOSIS — J45991 Cough variant asthma: Secondary | ICD-10-CM

## 2017-06-13 DIAGNOSIS — D509 Iron deficiency anemia, unspecified: Secondary | ICD-10-CM | POA: Insufficient documentation

## 2017-06-13 DIAGNOSIS — R058 Other specified cough: Secondary | ICD-10-CM

## 2017-06-13 LAB — CBC WITH DIFFERENTIAL/PLATELET
BASOS ABS: 0.1 10*3/uL (ref 0.0–0.1)
Basophils Relative: 1.3 % (ref 0.0–3.0)
Eosinophils Absolute: 0.1 10*3/uL (ref 0.0–0.7)
Eosinophils Relative: 2.4 % (ref 0.0–5.0)
HCT: 32.8 % — ABNORMAL LOW (ref 36.0–46.0)
Hemoglobin: 10.6 g/dL — ABNORMAL LOW (ref 12.0–15.0)
LYMPHS ABS: 1.3 10*3/uL (ref 0.7–4.0)
Lymphocytes Relative: 25.2 % (ref 12.0–46.0)
MCHC: 32.4 g/dL (ref 30.0–36.0)
MCV: 73.8 fl — ABNORMAL LOW (ref 78.0–100.0)
Monocytes Absolute: 0.5 10*3/uL (ref 0.1–1.0)
Monocytes Relative: 9.3 % (ref 3.0–12.0)
NEUTROS ABS: 3.3 10*3/uL (ref 1.4–7.7)
NEUTROS PCT: 61.8 % (ref 43.0–77.0)
PLATELETS: 336 10*3/uL (ref 150.0–400.0)
RBC: 4.44 Mil/uL (ref 3.87–5.11)
RDW: 15.9 % — ABNORMAL HIGH (ref 11.5–15.5)
WBC: 5.3 10*3/uL (ref 4.0–10.5)

## 2017-06-13 LAB — BRAIN NATRIURETIC PEPTIDE: Pro B Natriuretic peptide (BNP): 69 pg/mL (ref 0.0–100.0)

## 2017-06-13 LAB — TSH: TSH: 2.21 u[IU]/mL (ref 0.35–4.50)

## 2017-06-13 NOTE — Assessment & Plan Note (Signed)
-   failed singulair around 2005  - allergy profile 08/14/2014 >  IgE  129 with pos RAST Dust >  Cat > dog  -med calendar 07/14/2015  - Spirometry 05/17/2016  wnl during flare of chronic cough - gabapentin 100 mg tid as part of action plan rec 08/02/2016  - repeat Allergy profile 06/13/2017 >  Eos 0.1 /  IgE      Adequate control on present rx, reviewed in detail with pt > no change in rx needed

## 2017-06-13 NOTE — Progress Notes (Signed)
LMTCB

## 2017-06-13 NOTE — Telephone Encounter (Signed)
Called pt going over the results of her labwork with her and gave her the recs discussed by Dr. Melvyn Novas.  Pt stated at this time she did not want a referral to GI and stated she would have her PCP follow up on this labwork.  Pt also stated to me she does not eat a lot of meat but stated she will start to eat more.  Told pt to call our office if she needed anything. Pt expressed understanding. Nothing further needed at this current time.

## 2017-06-13 NOTE — Assessment & Plan Note (Signed)
deteced 06/13/2017 rec fe studies/ stool cards and gi w/u   Concerned about occult GIB in this setting and may well explain her poor ex tol  Can do w/u here or in [place] depending on her wishes/ advised

## 2017-06-13 NOTE — Assessment & Plan Note (Addendum)
-   Spirometry 05/17/16   FEV1 1.44  (72%)  Ratio 74    - Spirometry 06/13/2017  FEV1 1.34 (68%)  Ratio 73 p symbicort 80 x 2   - 06/13/2017  Walked RA x 3 laps @ 185 ft each stopped due to  End of study, nl pace, no desat, min sob, fast pace > rec regular ex/ complete cards eval planned, then cpst if not better to her satisfaction  - Anemia/ micocytic detected 06/13/2017 (see separate a/p)  No evidence chf/ thyroid dz

## 2017-06-13 NOTE — Progress Notes (Signed)
Subjective:     Patient ID: Allison Gross, female   DOB: [birth year]   MRN: [id number]   Brief patient profile:  5 yowf quit light smoking 1973 with intermittent sinus/ bronchitis but no chronic c/o's or need for rx not much better after stopped smoking then much worse after around 2000 despite daily maint rx for asthma. Previously found to be allergic to dust, cats and trees and mold but no seasonal pattern and did not feel allergy shots helped in 1990s    History of Present Illness  09/21/2013 Acute OV  Complains of hoarseness, some increased SOB, low grade temp, dry hacking cough with occasional yellow mucus, some tightness x 1 week.  She denies any calf pain, edema, hemoptysis, orthopnea, PND, nausea, vomiting, diarrhea. Does feel, that she's had a low-grade fever. Returned yesterday from a 2 week cruise.  Says many people on the cruise were sick with cough and similar symptoms. Was in [place].  Finished zpak on 6/3 and pred pak today by ships' physician. Has been using tramadol to today for cough with minimal help rec Levaquin 500mg  daily for 7 days  Prednisone taper over next week.  Delsym 2 tsp Twice daily   For cough  Tramadol 50mg  1 every 4hr as needed for breakthrough cough.  Mucinex Twice daily  As needed  Congestion . Saline nasal rinses As needed   Continue on Chlortrimeton 4mg  2 tabs At bedtime      04/26/2014 acute  ov/Eviana Sibilia re: recurrent cough  Chief Complaint  Patient presents with  . Acute Visit    Pt states that her cough is no better since acute visit here on 04/22/14. Her wheezing seems to be getting worse- esp at night. She is using proair about 4 x per day, and duoneb 2-3 x per day.    10/22/13 -p xmas 2015 doing great on symb 80 2 bid , allegra daily, omeprazole 20 qam / h1 x 2 at hs but not h2, then acutely ill with productive cough / sore throat/ subj wheeze esp at hs  But fully ambulatory and note the saba not helping her "wheezing" even in neb form Tramadol x  one no better / tessilon no better, hycodan no better  rec In the the event of cough for any reason: Use the flutter valve as much as possible mucinex dm 1200 mg every 12 hours and supplement with 2 tramadol until no longer coughing And omeprazole 40 mg Take 30- 60 min before your first and last meals of the day and Pepcid 20 mg one at bedtime  For drainage take chlortrimeton (chlorpheniramine) 4 mg every 4 hours available over the counter (may cause drowsiness) Prednisone 10 mg take  4 each am x 2 days,   2 each am x 2 days,  1 each am x 2 days and stop  GERD   If not better see [name] with all meds in hand  Add needs med calendar       12/16/2016  f/u ov/Lavonya Hoerner re:  Chronic cough  Chief Complaint  Patient presents with  . Follow-up    Pt states cough is "ok I have not had any trouble at all". She does c/o hoarseness. She has used her albuterol 2-3 x since last visit. She has not needed neb.   no longer taking any gerd rx/ just sybm 80 2bid and nasal steroids Exp to outdoor cat s flare Has not needed the gabapentin Not limited by breathing from desired activities  rec If cough persists more than 3 days on your action plan (which includes tramadol)  then start the gabapentin 100 mg three times daily  Follow med calendar and bring back to each office visit Please schedule a follow up visit in 6  months but call sooner if needed     06/13/2017  f/u ov/Reno Clasby re: chronic cough  X since her 40s/ no med cal/ still on ppi but stopped pepcid  Chief Complaint  Patient presents with  . Follow-up    Breathing is unchanged. She states she is not coughing and has not had to use her proair inhaler.    dyspnea:  Not limited by breathing from daily  activities  But when does [place] notes some doe  Reproducible all conditions x one year, minimal worsening p indolent onset  Cough: not a problem  Sleep: no resp issues / no hs pepcid but planning to use with spring allergies  SABA use:  None  Rarely  needing h1 but up 4x daily when cough flares seems to help a lot    No obvious day to day or daytime variability or assoc excess/ purulent sputum or mucus plugs or hemoptysis or cp or chest tightness, subjective wheeze or overt sinus or hb symptoms. No unusual exposure hx or h/o childhood pna/ asthma or knowledge of premature birth.  Sleeping ok flat without nocturnal  or early am exacerbation  of respiratory  c/o's or need for noct saba. Also denies any obvious fluctuation of symptoms with weather or environmental changes or other aggravating or alleviating factors except as outlined above   Current Allergies, Complete Past Medical History, Past Surgical History, Family History, and Social History were reviewed in [employer] record.  ROS  The following are not active complaints unless bolded Hoarseness, sore throat, dysphagia, dental problems, itching, sneezing,  nasal congestion or discharge of excess mucus or purulent secretions, ear ache,   fever, chills, sweats, unintended wt loss or wt gain, classically pleuritic or exertional cp,  orthopnea pnd or leg swelling, presyncope, palpitations, abdominal pain, anorexia, nausea, vomiting, diarrhea  or change in bowel habits or change in bladder habits, change in stools or change in urine, dysuria, hematuria,  rash, arthralgias, visual complaints, headache, numbness, weakness or ataxia or problems with walking or coordination,  change in mood/affect or memory.        Current Meds  Medication Sig  . budesonide (RHINOCORT AQUA) 32 MCG/ACT nasal spray Place 2 sprays into both nostrils daily.  . calcium-vitamin D (OSCAL WITH D) 500-200 MG-UNIT per tablet Take 1 tablet by mouth daily with breakfast.   . carbamazepine (TEGRETOL XR) 200 MG 12 hr tablet Take 200 mg by mouth 2 (two) times daily.    . chlorpheniramine (CHLOR-TRIMETON) 4 MG tablet Take 4 mg by mouth every 4 (four) hours as needed (drippy nose, drainage, throat clearing).    . famotidine (PEPCID) 20 MG tablet Take 20 mg by mouth at bedtime as needed.   . fexofenadine (ALLEGRA) 180 MG tablet Take 180 mg by mouth every morning.   . Guaifenesin (MUCINEX MAXIMUM STRENGTH) 1200 MG TB12 Take 1 tablet by mouth 2 (two) times daily as needed.   Marland Kitchen ibuprofen (ADVIL,MOTRIN) 400 MG tablet Take 400 mg by mouth every 8 (eight) hours as needed (joint pain).   Marland Kitchen ipratropium-albuterol (DUONEB) 0.5-2.5 (3) MG/3ML SOLN Take 3 mLs by nebulization every 4 (four) hours as needed (Wheezing/shortness of breath ((PLAN C))).   Marland Kitchen losartan-hydrochlorothiazide (HYZAAR) 50-12.5  MG per tablet Take 1 tablet by mouth every morning.   . Melatonin 3 MG TABS Take 1 tablet by mouth at bedtime.   . Multiple Vitamin (MULTIVITAMIN) tablet Take 1 tablet by mouth every morning.   . pantoprazole (PROTONIX) 40 MG tablet TAKE 1 TABLET BY MOUTH EVERY DAY 30-60 MINUTES BEFORE FIRST MEAL OF THE DAY  . potassium chloride (K-DUR) 10 MEQ tablet Take 1 tablet by mouth daily. ((2 tabs on Mondays and Fridays))  . PROAIR HFA 108 (90 Base) MCG/ACT inhaler INHALE 2 PUFFS EVERY 4 HOURS FOR WHEEZING OR SHORTNESS OF BREATH  . Respiratory Therapy Supplies (FLUTTER) DEVI As instructed  . SYMBICORT 80-4.5 MCG/ACT inhaler INHALE 2 PUFFS FIRST THING IN THE MORNING AND 2 PUFFS 12 HOURS LATER  . Turmeric 500 MG CAPS Take 1 capsule by mouth every morning.   . vitamin C (ASCORBIC ACID) 500 MG tablet Take 500 mg by mouth every morning.        Past Medical History:  Asthma  - HFA 50% November 10, 2009 > 75% December 24, 2009 > 90% March 26, 2010  Osteopenia  - hold fosfamax 09/2009 >> better with active HB so rec Reclast rx         Objective:  Physical Exam   07/14/2012   158 = baseline /   11/13/2014 162 > 02/14/2015   159 >  06/09/2015  157 > 11/13/2015  156 > 05/17/2016  161 > 08/02/2016  161 >  12/16/2016  158 > 06/13/2017  160     Vital signs reviewed - Note on arrival 02 sats  99% on RA     HEENT: nl dentition,  and  oropharynx. Nl external ear canals without cough reflex - mlld to moderate bilateral non-specific turbinate edema  With mucoid secretions    NECK :  without JVD/Nodes/TM/ nl carotid upstrokes bilaterally   LUNGS: no acc muscle use,  Nl contour chest which is clear to A and P bilaterally without cough on insp or exp maneuvers   CV:  RRR  no s3 or murmur or increase in P2, and no edema   ABD:  soft and nontender with nl inspiratory excursion in the supine position. No bruits or organomegaly appreciated, bowel sounds nl  MS:  Nl gait/ ext warm without deformities, calf tenderness, cyanosis or clubbing No obvious joint restrictions   SKIN: warm and dry without lesions    NEURO:  alert, approp, nl sensorium with  no motor or cerebellar deficits apparent.       Labs ordered 06/13/2017  Allergy profile       Labs ordered/ reviewed:      Lab Results  Component Value Date   WBC 5.3 06/13/2017   HGB 10.6 (L) 06/13/2017   HCT 32.8 (L) 06/13/2017   MCV 73.8 (L) 06/13/2017   PLT 336.0 06/13/2017        EOS                      0.1                                                                   06/13/2017    Lab Results  Component Value Date   TSH 2.21  06/13/2017     Lab Results  Component Value Date   PROBNP 69.0 06/13/2017            Assessment:

## 2017-06-13 NOTE — Assessment & Plan Note (Addendum)
-  HFA 90% p coaching 04/23/11 - c/o hoarseness > added spacer 12/01/2012  > improved but stopped using it  - maint on symbicort 80 2bid  - try taper off gerd rx 11/13/14 > flared end of August 2016  - NO 05/21/2015  = 5  - Spirometry 05/17/2016  Nl p am symbicort 80 x 2 and doe reported    - Spirometry 06/13/2017  FEV1 1.34 (68%)  Ratio 73 p am symb 80   X 2  With grad worse doe (see separate a/p)    Cough is much better but doe is not so (see separate a/p)   All goals of chronic asthma control met including optimal function and elimination of symptoms with minimal need for rescue therapy.  Contingencies discussed in full including contacting this office immediately if not controlling the symptoms using the rule of two's.     Each maintenance medication was reviewed in detail including most importantly the difference between maintenance and as needed and under what circumstances the prns are to be used.  Please see AVS for specific  Instructions which are unique to this visit and I personally typed out  which were reviewed in detail in writing with the patient and a copy provided.

## 2017-06-13 NOTE — Patient Instructions (Addendum)
No change in medications  Please remember to go to the lab department downstairs in the basement  for your tests - we will call you with the results when they are available.      Please schedule a follow up visit in 6 months but call sooner if needed Add: microcytic anemia detected > f/u by PCP or here depending on pt wishes

## 2017-06-15 LAB — RESPIRATORY ALLERGY PROFILE REGION II ~~LOC~~
ALLERGEN, D PTERNOYSSINUS, D1: 0.77 kU/L — AB
Allergen, Cedar tree, t12: <0.1 kU/L
Allergen, Comm Silver Birch, t9: <0.1 kU/L
Allergen, Mouse Urine Protein, e78: <0.1 kU/L
Allergen, Mulberry, t76: <0.1 kU/L
Allergen, Oak,t7: <0.1 kU/L
Allergen, P. notatum, m1: <0.1 kU/L
Aspergillus fumigatus, m3: <0.1 kU/L
CLADOSPORIUM HERBARUM (M2) IGE: <0.1 kU/L
CLASS: 0
CLASS: 0
CLASS: 0
CLASS: 0
CLASS: 0
CLASS: 0
CLASS: 0
CLASS: 1
COMMON RAGWEED (SHORT) (W1) IGE: <0.1 kU/L
Cat Dander: 0.53 kU/L — ABNORMAL HIGH
Class: 0
Class: 0
Class: 0
Class: 0
Class: 0
Class: 0
Class: 0
Class: 0
Class: 0
Class: 0
Class: 0
Class: 0
Class: 0
Class: 1
Class: 2
Class: 2
Cockroach: 0.38 kU/L — ABNORMAL HIGH
D. farinae: 0.8 kU/L — ABNORMAL HIGH
DOG DANDER: 0.13 kU/L — AB
IGE (IMMUNOGLOBULIN E), SERUM: 62 kU/L (ref <=114)
Pecan/Hickory Tree IgE: <0.1 kU/L
Rough Pigweed  IgE: <0.1 kU/L

## 2017-06-15 LAB — INTERPRETATION:

## 2017-06-20 ENCOUNTER — Telehealth: Payer: Self-pay | Admitting: Internal Medicine

## 2017-06-20 NOTE — Telephone Encounter (Signed)
Pt is calling Magda Paganini back [phone]

## 2017-06-20 NOTE — Telephone Encounter (Signed)
Called pt giving her the results of the labwork she had done.  Pt expressed understanding. Pt stated she currently has minor cold going on where she is sneezing and having clear nasal drainage.  Pt states she is currently treating symptoms with OTC meds and does not need any Rx from [facility].  I stated to pt to call us if her symptoms worsen and we could see if [facility] could send her a script in.  Pt expressed understanding. Nothing further needed at this current time.

## 2017-06-20 NOTE — Progress Notes (Signed)
LMTCB

## 2017-06-24 ENCOUNTER — Telehealth: Payer: Self-pay | Admitting: Internal Medicine

## 2017-06-24 MED ORDER — PREDNISONE 10 MG PO TABS: ORAL_TABLET | ORAL | 0 refills | Status: DC

## 2017-06-24 MED ORDER — IPRATROPIUM-ALBUTEROL 0.5-2.5 (3) MG/3ML IN SOLN: 3.0000 mL | RESPIRATORY_TRACT | 2 refills | Status: DC | PRN

## 2017-06-24 NOTE — Telephone Encounter (Signed)
Ok to refill duoneb plus take Prednisone 10 mg take  4 each am x 2 days,   2 each am x 2 days,  1 each am x 2 days and stop then return next week if not better

## 2017-06-24 NOTE — Telephone Encounter (Signed)
Pt is aware of below message and her understanding. Rx for duoneb and prednisone have been sent to preferred pharmacy. Nothing further is needed.

## 2017-06-24 NOTE — Telephone Encounter (Signed)
Pt reports of prod cough with clear to white mucus & sneezing x2d Denies fever, chills, sweats or body aches.  Pt states she taking chlor-trimeton 4mg , Mucinex 600mg  & proair with some relief.  Pt states she has not been taking deuoneb, as a refill is needed.   [facility] please advise. Thanks   06/13/17 AVS: No change in medications  Please remember to go to the lab department downstairs in the basement  for your tests - we will call you with the results when they are available.      Please schedule a follow up visit in 6 months but call sooner if needed Add: microcytic anemia detected > f/u by PCP or here depending on pt wishes

## 2017-07-19 ENCOUNTER — Other Ambulatory Visit: Payer: Self-pay | Admitting: Internal Medicine

## 2017-07-19 MED ORDER — BUDESONIDE-FORMOTEROL FUMARATE 80-4.5 MCG/ACT IN AERO: INHALATION_SPRAY | RESPIRATORY_TRACT | 11 refills | Status: DC

## 2017-08-02 ENCOUNTER — Telehealth: Payer: Self-pay | Admitting: Internal Medicine

## 2017-08-02 DIAGNOSIS — R058 Other specified cough: Secondary | ICD-10-CM

## 2017-08-02 DIAGNOSIS — R05 Cough: Secondary | ICD-10-CM

## 2017-08-02 MED ORDER — PANTOPRAZOLE SODIUM 40 MG PO TBEC: DELAYED_RELEASE_TABLET | ORAL | 4 refills | Status: DC

## 2017-08-02 NOTE — Telephone Encounter (Signed)
Called and spoke with patient she advised that she was needing a refill on the pantoprazole. Refill sent to verified pharmacy. Nothing further needed.

## 2017-09-20 ENCOUNTER — Other Ambulatory Visit: Payer: Self-pay | Admitting: Internal Medicine

## 2017-09-20 MED ORDER — BUDESONIDE 32 MCG/ACT NA SUSP: 2.0000 | Freq: Every day | NASAL | 11 refills | Status: DC

## 2017-12-14 ENCOUNTER — Ambulatory Visit (INDEPENDENT_AMBULATORY_CARE_PROVIDER_SITE_OTHER): Payer: BLUE CROSS/BLUE SHIELD | Admitting: Internal Medicine

## 2017-12-14 ENCOUNTER — Ambulatory Visit: Payer: BLUE CROSS/BLUE SHIELD | Admitting: Internal Medicine

## 2017-12-14 ENCOUNTER — Encounter: Payer: Self-pay | Admitting: Internal Medicine

## 2017-12-14 VITALS — BP 126/80 | HR 71 | Ht 61.0 in | Wt 156.4 lb

## 2017-12-14 DIAGNOSIS — J45991 Cough variant asthma: Secondary | ICD-10-CM | POA: Diagnosis not present

## 2017-12-14 DIAGNOSIS — R058 Other specified cough: Secondary | ICD-10-CM

## 2017-12-14 DIAGNOSIS — R05 Cough: Secondary | ICD-10-CM | POA: Diagnosis not present

## 2017-12-14 NOTE — Progress Notes (Signed)
Subjective:     Patient ID: Allison Gross, female   DOB: Feb 01, 1946   MRN: [id number]   Brief patient profile:  68 yowf quit light smoking 1973 with intermittent sinus/ bronchitis but no chronic c/o's or need for rx not much better after stopped smoking then much worse after around 2000 despite daily maint rx for asthma. Previously found to be allergic to dust, cats and trees and mold but no seasonal pattern and did not feel allergy shots helped in 1990s    History of Present Illness  09/21/2013 Acute OV  Complains of hoarseness, some increased SOB, low grade temp, dry hacking cough with occasional yellow mucus, some tightness x 1 week.  She denies any calf pain, edema, hemoptysis, orthopnea, PND, nausea, vomiting, diarrhea. Does feel, that she's had a low-grade fever. Returned yesterday from a 2 week cruise.  Says many people on the cruise were sick with cough and similar symptoms. Was in [place].  Finished zpak on 6/3 and pred pak today by ships' physician. Has been using tramadol to today for cough with minimal help rec Levaquin 500mg  daily for 7 days  Prednisone taper over next week.  Delsym 2 tsp Twice daily   For cough  Tramadol 50mg  1 every 4hr as needed for breakthrough cough.  Mucinex Twice daily  As needed  Congestion . Saline nasal rinses As needed   Continue on Chlortrimeton 4mg  2 tabs At bedtime      04/26/2014 acute  ov/Amoy Steeves re: recurrent cough  Chief Complaint  Patient presents with  . Acute Visit    Pt states that her cough is no better since acute visit here on 04/22/14. Her wheezing seems to be getting worse- esp at night. She is using proair about 4 x per day, and duoneb 2-3 x per day.    10/22/13 -p xmas 2015 doing great on symb 80 2 bid , allegra daily, omeprazole 20 qam / h1 x 2 at hs but not h2, then acutely ill with productive cough / sore throat/ subj wheeze esp at hs  But fully ambulatory and note the saba not helping her "wheezing" even in neb form Tramadol x  one no better / tessilon no better, hycodan no better  rec In the the event of cough for any reason: Use the flutter valve as much as possible mucinex dm 1200 mg every 12 hours and supplement with 2 tramadol until no longer coughing And omeprazole 40 mg Take 30- 60 min before your first and last meals of the day and Pepcid 20 mg one at bedtime  For drainage take chlortrimeton (chlorpheniramine) 4 mg every 4 hours available over the counter (may cause drowsiness) Prednisone 10 mg take  4 each am x 2 days,   2 each am x 2 days,  1 each am x 2 days and stop  GERD   If not better see [name] with all meds in hand  Add needs med calendar       12/16/2016  f/u ov/Glenette Bookwalter re:  Chronic cough  Chief Complaint  Patient presents with  . Follow-up    Pt states cough is "ok I have not had any trouble at all". She does c/o hoarseness. She has used her albuterol 2-3 x since last visit. She has not needed neb.   no longer taking any gerd rx/ just sybm 80 2bid and nasal steroids Exp to outdoor cat s flare Has not needed the gabapentin Not limited by breathing from desired activities  rec If cough persists more than 3 days on your action plan (which includes tramadol)  then start the gabapentin 100 mg three times daily  Follow med calendar and bring back to each office visit Please schedule a follow up visit in 6  months but call sooner if needed     06/13/2017  f/u ov/Devontaye Ground re: chronic cough  X since her 40s/ no med cal/ still on ppi but stopped pepcid  Chief Complaint  Patient presents with  . Follow-up    Breathing is unchanged. She states she is not coughing and has not had to use her proair inhaler.    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 Follow-up    follow up for cough. patient states no problems. doing better   Dyspnea:  Not limited by breathing from desired activities  - still some doe x hills better if uses saba first  Cough: gone / mild  hoarseness / not needing gabapentin  Sleeping: lately flat one pillow  SABA use: none   No obvious day to day or daytime variability or assoc excess/ purulent sputum or mucus plugs or hemoptysis or cp or chest tightness, subjective wheeze or overt sinus or hb symptoms.   Sleeps as above  without nocturnal  or early am exacerbation  of respiratory  c/o's or need for noct saba. Also denies any obvious fluctuation of symptoms with weather or environmental changes or other aggravating or alleviating factors except as outlined above   No unusual exposure hx or h/o childhood pna/ asthma or knowledge of premature birth.  Current Allergies, Complete Past Medical History, Past Surgical History, Family History, and Social History were reviewed in [employer] record.  ROS  The following are not active complaints unless bolded Hoarseness, sore throat, dysphagia, dental problems, itching, sneezing,  nasal congestion or discharge of excess mucus or purulent secretions, ear ache,   fever, chills, sweats, unintended wt loss or wt gain, classically pleuritic or exertional cp,  orthopnea pnd or arm/hand swelling  or leg swelling, presyncope, palpitations, abdominal pain, anorexia, nausea, vomiting, diarrhea  or change in bowel habits or change in bladder habits, change in stools or change in urine, dysuria, hematuria,  rash, arthralgias, visual complaints, headache, numbness, weakness or  ataxia or problems with walking or coordination,  change in mood or  memory.        Current Meds  Medication Sig  . budesonide (RHINOCORT AQUA) 32 MCG/ACT nasal spray Place 2 sprays into both nostrils daily.  . budesonide-formoterol (SYMBICORT) 80-4.5 MCG/ACT inhaler INHALE 2 PUFFS FIRST THING IN THE MORNING AND 2 PUFFS 12 HOURS LATER  . calcium-vitamin D (OSCAL WITH D) 500-200 MG-UNIT per tablet Take 1 tablet by mouth daily with breakfast.   . carbamazepine (TEGRETOL XR) 200 MG 12 hr tablet Take 200 mg by mouth 2 (  two) times daily.    . chlorpheniramine (CHLOR-TRIMETON) 4 MG tablet Take 4 mg by mouth every 4 (four) hours as needed (drippy nose, drainage, throat clearing).   . famotidine (PEPCID) 20 MG tablet Take 20 mg by mouth at bedtime as needed.   . fexofenadine (ALLEGRA) 180 MG tablet Take 180 mg by mouth every morning.   . Guaifenesin (MUCINEX MAXIMUM STRENGTH) 1200 MG TB12 Take 1 tablet by mouth 2 (two) times daily as needed.   Marland Kitchen ibuprofen (ADVIL,MOTRIN) 400 MG tablet Take 400 mg by mouth every 8 (eight) hours as needed (joint pain).   Marland Kitchen ipratropium-albuterol (DUONEB) 0.5-2.5 (3) MG/3ML SOLN Take 3 mLs by nebulization every 4 (four) hours as needed (Wheezing/shortness of breath ((PLAN C))).  . Iron-Vitamin C 65-125 MG TABS Take 65 mg by mouth daily.  Marland Kitchen losartan-hydrochlorothiazide (HYZAAR) 50-12.5 MG per tablet Take 1 tablet by mouth every morning.   . Melatonin 3 MG TABS Take 1 tablet by mouth at bedtime.   . Multiple Vitamin (MULTIVITAMIN) tablet Take 1 tablet by mouth every morning.   . pantoprazole (PROTONIX) 40 MG tablet TAKE 1 TABLET BY MOUTH EVERY DAY 30-60 MINUTES BEFORE FIRST MEAL OF THE DAY  . potassium chloride (K-DUR) 10 MEQ tablet Take 1 tablet by mouth daily. ((2 tabs on Mondays and Fridays))  . PROAIR HFA 108 (90 Base) MCG/ACT inhaler INHALE 2 PUFFS EVERY 4 HOURS FOR WHEEZING OR SHORTNESS OF BREATH  . Respiratory Therapy Supplies (FLUTTER) DEVI As instructed  . Turmeric  500 MG CAPS Take 1 capsule by mouth every morning.   . vitamin C (ASCORBIC ACID) 500 MG tablet Take 500 mg by mouth every morning.          Past Medical History:  Asthma  - HFA 50% November 10, 2009 > 75% December 24, 2009 > 90% March 26, 2010  Osteopenia  - hold fosfamax 09/2009 >> better with active HB so rec [facility] rx         Objective:  Physical Exam   07/14/2012   158 = baseline /   11/13/2014 162 > 02/14/2015   159 >  06/09/2015  157 > 11/13/2015  156 > 05/17/2016  161 > 08/02/2016  161 >  12/16/2016  158 > 06/13/2017  160 > 12/14/2017  95%    Vital signs reviewed - Note on arrival 02 sats  95% on RA    amb wf nad   HEENT: nl dentition, turbinates bilaterally, and oropharynx. Nl external ear canals without cough reflex   NECK :  without JVD/Nodes/TM/ nl carotid upstrokes bilaterally   LUNGS: no acc muscle use,  Nl contour chest which is clear to A and P bilaterally without cough on insp or exp maneuvers   CV:  RRR  no s3 or murmur or increase in P2, and no edema   ABD:  soft and nontender with nl inspiratory excursion in the supine position. No bruits or organomegaly appreciated, bowel sounds nl  MS:  Nl gait/ ext warm without deformities, calf tenderness, cyanosis or clubbing No obvious joint restrictions   SKIN: warm and dry without lesions    NEURO:  alert, approp, nl sensorium with  no motor or cerebellar deficits apparent.                  Assessment:

## 2017-12-14 NOTE — Assessment & Plan Note (Signed)
-  HFA 90% p coaching 04/23/11 - c/o hoarseness > added spacer 12/01/2012  > improved but stopped using it  - maint on symbicort 80 2bid  - try taper off gerd rx 11/13/14 > flared end of August 2016  - NO 05/21/2015  = 5  - Spirometry 05/17/2016  Nl p am symbicort 80 x 2 and doe reported   - Spirometry 06/13/2017  FEV1 1.34 (68%)  Ratio 73 p am symb 80   X 2    All goals of chronic asthma control met including optimal function and elimination of symptoms with minimal need for rescue therapy.  Contingencies discussed in full including contacting this office immediately if not controlling the symptoms using the rule of two's.     Since having hoarseness on symb 80 2bid ok to try just 2 pffs qam and use the pm rx prn Based on two studies from [place]  378; 20 p 1865 (2018) and 380 : p2020-30 (2019) in pts with mild asthma it is reasonable to use low dose symbicort eg 80 2bid "prn" flare in this setting but I emphasized this was only shown with symbicort and takes advantage of the rapid onset of action but is not the same as "rescue therapy" but can be stopped once the acute symptoms have resolved and the need for rescue has been minimized (< 2 x weekly)     F/u can be yearly at this point    I had an extended discussion with the patient reviewing all relevant studies completed to date and  lasting 15 to 20 minutes of a 25 minute visit    Each maintenance medication was reviewed in detail including most importantly the difference between maintenance and prns and under what circumstances the prns are to be triggered using an action plan format that is not reflected in the computer generated alphabetically organized AVS.     Please see AVS for specific instructions unique to this visit that I personally wrote and verbalized to the the pt in detail and then reviewed with pt  by my nurse highlighting any  changes in therapy recommended at today's visit to their plan of care.

## 2017-12-14 NOTE — Assessment & Plan Note (Signed)
-   failed singulair around 2005  - allergy profile 08/14/2014 >  IgE  129 with pos RAST Dust >  Cat > dog   - Spirometry 05/17/2016  wnl during flare of chronic cough - gabapentin 100 mg tid as part of action plan rec 08/02/2016  - repeat Allergy profile 06/13/2017 >  Eos 0.1 /  IgE 62 same rast panel as 08/14/14 study   This is much better but still assoc with hoarseness which may be exac by symbiocort so may be able to reduce rx esp the noct dose > has gabapentin still for prn use

## 2017-12-14 NOTE — Patient Instructions (Addendum)
No change medications other than ok to reduce the pm dose of symbicort to take as needed to see if helps hoarseness or not and increase back to 2 puffs every 12 hours if breathing or cough worse.   Please schedule a follow up visit in 12  Months @ our new location  but call sooner if needed

## 2018-01-23 ENCOUNTER — Telehealth: Payer: Self-pay | Admitting: Internal Medicine

## 2018-01-23 MED ORDER — PREDNISONE 10 MG PO TABS: ORAL_TABLET | ORAL | 0 refills | Status: DC

## 2018-01-23 NOTE — Telephone Encounter (Signed)
Spoke with pt. States that she is not feeling well. Reports increased coughing and wheezing. Cough is producing clear mucus. Denies chest tightness, SOB or fever. Symptoms started 3-4 days ago. Would like to have prednisone sent in. Pt can't come for an appointment due to her being in [place].  MW - please advise. Thanks.

## 2018-01-23 NOTE — Telephone Encounter (Signed)
Prednisone 10 mg take  4 each am x 2 days,   2 each am x 2 days,  1 each am x 2 days and stop  

## 2018-01-23 NOTE — Telephone Encounter (Signed)
I have left another message with the pt making her aware that this prescription has been sent in.

## 2018-01-23 NOTE — Telephone Encounter (Signed)
Per Dr. Melvyn Novas - Prednisone 10 mg take  4 each am x 2 days,   2 each am x 2 days,  1 each am x 2 days and stop  Prescription sent to Patient preferred pharmacy, [facility], [place], [place]. Left message for Patient on cell and home numbers re guarding prescription.

## 2018-01-23 NOTE — Telephone Encounter (Signed)
Patient returned call, CB is [phone]

## 2018-02-02 ENCOUNTER — Ambulatory Visit (INDEPENDENT_AMBULATORY_CARE_PROVIDER_SITE_OTHER): Payer: BLUE CROSS/BLUE SHIELD | Admitting: Nurse Practitioner

## 2018-02-02 ENCOUNTER — Encounter: Payer: Self-pay | Admitting: Nurse Practitioner

## 2018-02-02 ENCOUNTER — Telehealth: Payer: Self-pay | Admitting: Internal Medicine

## 2018-02-02 DIAGNOSIS — J45901 Unspecified asthma with (acute) exacerbation: Secondary | ICD-10-CM

## 2018-02-02 MED ORDER — AZITHROMYCIN 250 MG PO TABS: ORAL_TABLET | ORAL | 0 refills | Status: DC

## 2018-02-02 MED ORDER — BENZONATATE 100 MG PO CAPS: 100.0000 mg | ORAL_CAPSULE | Freq: Three times a day (TID) | ORAL | 1 refills | Status: DC

## 2018-02-02 NOTE — Assessment & Plan Note (Signed)
Patient Instructions  Will order Azithromycin  Will order tessalon pearls May take mucinex Continue current medications Please follow up with Dr. Melvyn Novas as scheduled or sooner if needed  Recommendations: Hydrate  Drink enough water to keep your pee (urine) clear or pale yellow. Rest  Rest as much as possible.  Sleep with your head raised (elevated).  Make sure to get enough sleep each night. General instructions  Put a warm, moist washcloth on your face 3-4 times a day. This will help with discomfort.  Wash your hands often with soap and water. If there is no soap and water, use hand sanitizer.  Do not smoke. Avoid being around people who are smoking (secondhand smoke). Call the office if:  You have a fever.  Your symptoms get worse.  Your symptoms do not get better within 10 days.

## 2018-02-02 NOTE — Patient Instructions (Addendum)
Will order Azithromycin  Will order tessalon pearls May take mucinex Continue current medications Please follow up with Dr. Melvyn Novas as scheduled or sooner if needed  Recommendations: Hydrate  Drink enough water to keep your pee (urine) clear or pale yellow. Rest  Rest as much as possible.  Sleep with your head raised (elevated).  Make sure to get enough sleep each night. General instructions  Put a warm, moist washcloth on your face 3-4 times a day. This will help with discomfort.  Wash your hands often with soap and water. If there is no soap and water, use hand sanitizer.  Do not smoke. Avoid being around people who are smoking (secondhand smoke). Call the office if:  You have a fever.  Your symptoms get worse.  Your symptoms do not get better within 10 days.

## 2018-02-02 NOTE — Telephone Encounter (Signed)
Called and spoke with pt who states she is still having a cough with thick white mucus.  Pt denies any fever and denies any current chest discomfort.  Pt states she just finished with prednisone Rx and is still having problems.  Advised pt we should schedule her an appt to come to the office. Pt expressed understanding.   I scheduled pt an acute visit with Lazaro Arms this afternoon, 10/17 at 2pm.  Nothing further needed

## 2018-02-02 NOTE — Progress Notes (Signed)
@Patient  ID: Allison Gross, female    DOB: 13-Apr-1946, 72 y.o.   MRN: [id number]  Chief Complaint  Patient presents with  . Acute Visit    Thought she had a virus 2 weeks ago, called and got prednisone taper, has nasal drainage, prednisone did not help, cough with clear mucous, denies thorat irritation. Finished prednisone saturday.     Referring provider: Nyoka Cowden, MD  HPI  72 year old female with cough variant asthma followed by Dr. Sherene Sires.  Tests: Spirometry 05/17/2016  Nl p am symbicort 80 x 2 and doe reported   Spirometry 06/13/2017  FEV1 1.34 (68%)  Ratio 73 p am symb 80   X 2  OV 02/02/18 - Acute cough Patient presents today with cough. She states that symptoms started 2 weeks ago. She called the office and was sent in a prednisone taper. She has completed the taper, but states that symptoms persist and are progressively worsening. States that cough is productive of opaque sputum. She has been compliant with Symbicort and duoneb treatment. She take chol-trimeton also. She denies any fever, chest pain, or edema.     Allergies  Allergen Reactions  . Sulfonamide Derivatives Nausea And Vomiting    Immunization History  Administered Date(s) Administered  . Influenza Split 02/10/2011, 01/18/2015, 01/18/2016  . Influenza Whole 01/18/2012, 01/27/2012  . Influenza,inj,Quad PF,6+ Mos 01/03/2013  . Influenza-Unspecified 01/17/2014, 01/17/2017  . Pneumococcal Polysaccharide-23 01/18/2007    Past Medical History:  Diagnosis Date  . Asthma   . Cancer (HCC)    basil,squamous  . Hypertension   . Osteopenia   . Seizures (HCC) 15 years ago   1982's from childbirth    Tobacco History: Social History   Tobacco Use  Smoking Status Former Smoker  . Packs/day: 0.10  . Years: 5.00  . Pack years: 0.50  . Types: Cigarettes  . Last attempt to quit: 04/19/1984  . Years since quitting: 33.8  Smokeless Tobacco Never Used  Tobacco Comment   reports smoked socially    Counseling given: Yes Comment: reports smoked socially   Outpatient Encounter Medications as of 02/02/2018  Medication Sig  . budesonide (RHINOCORT AQUA) 32 MCG/ACT nasal spray Place 2 sprays into both nostrils daily.  . budesonide-formoterol (SYMBICORT) 80-4.5 MCG/ACT inhaler INHALE 2 PUFFS FIRST THING IN THE MORNING AND 2 PUFFS 12 HOURS LATER  . calcium-vitamin D (OSCAL WITH D) 500-200 MG-UNIT per tablet Take 1 tablet by mouth daily with breakfast.   . carbamazepine (TEGRETOL XR) 200 MG 12 hr tablet Take 200 mg by mouth 2 (two) times daily.    . chlorpheniramine (CHLOR-TRIMETON) 4 MG tablet Take 4 mg by mouth every 4 (four) hours as needed (drippy nose, drainage, throat clearing).   . famotidine (PEPCID) 20 MG tablet Take 20 mg by mouth at bedtime as needed.   . fexofenadine (ALLEGRA) 180 MG tablet Take 180 mg by mouth every morning.   . Guaifenesin (MUCINEX MAXIMUM STRENGTH) 1200 MG TB12 Take 1 tablet by mouth 2 (two) times daily as needed.   Marland Kitchen ibuprofen (ADVIL,MOTRIN) 400 MG tablet Take 400 mg by mouth every 8 (eight) hours as needed (joint pain).   Marland Kitchen ipratropium-albuterol (DUONEB) 0.5-2.5 (3) MG/3ML SOLN Take 3 mLs by nebulization every 4 (four) hours as needed (Wheezing/shortness of breath ((PLAN C))).  . Iron-Vitamin C 65-125 MG TABS Take 65 mg by mouth daily.  Marland Kitchen losartan-hydrochlorothiazide (HYZAAR) 50-12.5 MG per tablet Take 1 tablet by mouth every morning.   . Melatonin 3  MG TABS Take 1 tablet by mouth at bedtime.   . Multiple Vitamin (MULTIVITAMIN) tablet Take 1 tablet by mouth every morning.   . pantoprazole (PROTONIX) 40 MG tablet TAKE 1 TABLET BY MOUTH EVERY DAY 30-60 MINUTES BEFORE FIRST MEAL OF THE DAY  . potassium chloride (K-DUR) 10 MEQ tablet Take 1 tablet by mouth daily. ((2 tabs on Mondays and Fridays))  . predniSONE (DELTASONE) 10 MG tablet 4 in morning x's 2 day,2 in morning x's 2 days,1 in morning x's 2 days,stop  . PROAIR HFA 108 (90 Base) MCG/ACT inhaler INHALE 2  PUFFS EVERY 4 HOURS FOR WHEEZING OR SHORTNESS OF BREATH  . Respiratory Therapy Supplies (FLUTTER) DEVI As instructed  . Turmeric 500 MG CAPS Take 1 capsule by mouth every morning.   . vitamin C (ASCORBIC ACID) 500 MG tablet Take 500 mg by mouth every morning.   Marland Kitchen azithromycin (ZITHROMAX) 250 MG tablet Take 2 tablets (500 mg) on day 1, then take 1 tablet (250 mg) on days 2-5  . benzonatate (TESSALON) 100 MG capsule Take 1 capsule (100 mg total) by mouth 3 (three) times daily.   Facility-Administered Encounter Medications as of 02/02/2018  Medication  . 0.9 %  sodium chloride infusion     Review of Systems  Review of Systems  Constitutional: Negative.  Negative for activity change, appetite change, chills and fever.  HENT: Negative.  Negative for congestion, sinus pressure and sinus pain.   Respiratory: Positive for cough and shortness of breath. Negative for wheezing.   Cardiovascular: Negative.  Negative for chest pain, palpitations and leg swelling.  Gastrointestinal: Negative.   Allergic/Immunologic: Negative.   Neurological: Negative.   Psychiatric/Behavioral: Negative.        Physical Exam  BP 130/72   Pulse 92   Temp 97.6 F (36.4 C) (Oral)   Wt 156 lb 12.8 oz (71.1 kg)   SpO2 95%   BMI 29.63 kg/m   Wt Readings from Last 5 Encounters:  02/02/18 156 lb 12.8 oz (71.1 kg)  12/14/17 156 lb 6.4 oz (70.9 kg)  06/13/17 160 lb (72.6 kg)  12/16/16 158 lb (71.7 kg)  08/16/16 162 lb 12.8 oz (73.8 kg)     Physical Exam  Constitutional: She is oriented to person, place, and time. She appears well-developed and well-nourished. No distress.  Cardiovascular: Normal rate and regular rhythm.  Pulmonary/Chest: Effort normal and breath sounds normal. No respiratory distress.  Musculoskeletal: She exhibits no edema.  Neurological: She is alert and oriented to person, place, and time.  Psychiatric: She has a normal mood and affect.  Nursing note and vitals  reviewed.     Assessment & Plan:   Acute asthma exacerbation Patient Instructions  Will order Azithromycin  Will order tessalon pearls May take mucinex Continue current medications Please follow up with Dr. Sherene Sires as scheduled or sooner if needed  Recommendations: Hydrate  Drink enough water to keep your pee (urine) clear or pale yellow. Rest  Rest as much as possible.  Sleep with your head raised (elevated).  Make sure to get enough sleep each night. General instructions  Put a warm, moist washcloth on your face 3-4 times a day. This will help with discomfort.  Wash your hands often with soap and water. If there is no soap and water, use hand sanitizer.  Do not smoke. Avoid being around people who are smoking (secondhand smoke). Call the office if:  You have a fever.  Your symptoms get worse.  Your symptoms do  not get better within 10 days.        Ivonne Andrew, NP 02/02/2018

## 2018-02-04 NOTE — Progress Notes (Signed)
Chart and office note reviewed in detail  > agree with a/p as outlined    

## 2018-02-07 ENCOUNTER — Telehealth: Payer: Self-pay | Admitting: *Deleted

## 2018-02-07 NOTE — Telephone Encounter (Signed)
-----   Message from Tanda Rockers, MD sent at 02/04/2018  7:16 AM EDT ----- Set up f/u in 2 weeks to be sure cough doesn't turn chronic

## 2018-02-07 NOTE — Telephone Encounter (Signed)
LMTCB

## 2018-02-07 NOTE — Telephone Encounter (Signed)
Called and spoke with pt letting her know that MW wanted her to see him in two weeks for a follow up to make sure her cough does not become chronic.  Pt expressed understanding. Scheduled pt an OV with MW Tuesday, 11/5 at 11:45.  Nothing further needed.

## 2018-02-07 NOTE — Telephone Encounter (Signed)
Patient is returning call. CB is [phone]

## 2018-02-10 ENCOUNTER — Ambulatory Visit (INDEPENDENT_AMBULATORY_CARE_PROVIDER_SITE_OTHER): Payer: BLUE CROSS/BLUE SHIELD

## 2018-02-10 ENCOUNTER — Telehealth: Payer: Self-pay | Admitting: Internal Medicine

## 2018-02-10 DIAGNOSIS — Z23 Encounter for immunization: Secondary | ICD-10-CM | POA: Diagnosis not present

## 2018-02-10 NOTE — Telephone Encounter (Signed)
Called and spoke with pt who stated she is feeling much better now and she is no longer on pred or abx.  I stated to pt we could get her scheduled for a flu shot at any time. Pt expressed understanding. Scheduled pt for a flu shot today at 1:35 with [employer]. Nothing further needed.

## 2018-02-21 ENCOUNTER — Other Ambulatory Visit: Payer: Self-pay | Admitting: Internal Medicine

## 2018-02-21 ENCOUNTER — Encounter: Payer: Self-pay | Admitting: Internal Medicine

## 2018-02-21 ENCOUNTER — Ambulatory Visit (INDEPENDENT_AMBULATORY_CARE_PROVIDER_SITE_OTHER): Payer: BLUE CROSS/BLUE SHIELD | Admitting: Internal Medicine

## 2018-02-21 VITALS — BP 130/80 | HR 60 | Ht 61.0 in | Wt 155.6 lb

## 2018-02-21 DIAGNOSIS — R05 Cough: Secondary | ICD-10-CM

## 2018-02-21 DIAGNOSIS — R058 Other specified cough: Secondary | ICD-10-CM

## 2018-02-21 DIAGNOSIS — J45991 Cough variant asthma: Secondary | ICD-10-CM

## 2018-02-21 MED ORDER — TRAMADOL HCL 50 MG PO TABS: 50.0000 mg | ORAL_TABLET | ORAL | 0 refills | Status: DC | PRN

## 2018-02-21 NOTE — Progress Notes (Signed)
Subjective:     Patient ID: Allison Gross, female   DOB: September 24, 1945   MRN: [id number]   Brief patient profile:  89 yowf quit light smoking 1973 with intermittent sinus/ bronchitis but no chronic c/o's or need for rx not much better after stopped smoking then much worse after around 2000 despite daily maint rx for asthma. Previously found to be allergic to dust, cats and trees and mold but no seasonal pattern and did not feel allergy shots helped in 1990s      History of Present Illness  09/21/2013 Acute OV  Complains of hoarseness, some increased SOB, low grade temp, dry hacking cough with occasional yellow mucus, some tightness x 1 week.  She denies any calf pain, edema, hemoptysis, orthopnea, PND, nausea, vomiting, diarrhea. Does feel, that she's had a low-grade fever. Returned yesterday from a 2 week cruise.  Says many people on the cruise were sick with cough and similar symptoms. Was in [place].  Finished zpak on 6/3 and pred pak today by ships' physician. Has been using tramadol to today for cough with minimal help rec Levaquin 500mg  daily for 7 days  Prednisone taper over next week.  Delsym 2 tsp Twice daily   For cough  Tramadol 50mg  1 every 4hr as needed for breakthrough cough.  Mucinex Twice daily  As needed  Congestion . Saline nasal rinses As needed   Continue on Chlortrimeton 4mg  2 tabs At bedtime      04/26/2014 acute  ov/Alif Petrak re: recurrent cough  Chief Complaint  Patient presents with  . Acute Visit    Pt states that her cough is no better since acute visit here on 04/22/14. Her wheezing seems to be getting worse- esp at night. She is using proair about 4 x per day, and duoneb 2-3 x per day.    10/22/13 -p xmas 2015 doing great on symb 80 2 bid , allegra daily, omeprazole 20 qam / h1 x 2 at hs but not h2, then acutely ill with productive cough / sore throat/ subj wheeze esp at hs  But fully ambulatory and note the saba not helping her "wheezing" even in neb form Tramadol  x one no better / tessilon no better, hycodan no better  rec In the the event of cough for any reason: Use the flutter valve as much as possible mucinex dm 1200 mg every 12 hours and supplement with 2 tramadol until no longer coughing And omeprazole 40 mg Take 30- 60 min before your first and last meals of the day and Pepcid 20 mg one at bedtime  For drainage take chlortrimeton (chlorpheniramine) 4 mg every 4 hours available over the counter (may cause drowsiness) Prednisone 10 mg take  4 each am x 2 days,   2 each am x 2 days,  1 each am x 2 days and stop  GERD   If not better see [name] with all meds in hand  Add needs med calendar     12/14/2017  f/u ov/Alfredo Spong re:  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 Follow-up    follow up for cough. patient states no problems. doing better   Dyspnea:  Not limited by breathing from desired activities  - still some doe x hills better if uses saba first  Cough: gone / mild  hoarseness / not needing gabapentin  Sleeping: lately flat one pillow  SABA use: none rec No change medications other than  ok to reduce the pm dose of symbicort to take as needed to see if helps hoarseness or not and increase back to 2 puffs every 12 hours if breathing or cough worse.       02/21/2018  f/u ov/Cyrene Gharibian re:  Chief Complaint  Patient presents with  . Follow-up    Cough has "pretty much disappeared".  She has not had to use her albuterol inhaler or neb recently.   Dyspnea:  Walking  Not as much but Not limited by breathing from desired activities   Cough: resolved/ off tramadol  Sleeping: fine flat bed with large pillow SABA use: occ before ex 02: no     No obvious day to day or daytime variability or assoc excess/ purulent sputum or mucus plugs or hemoptysis or cp or chest tightness, subjective wheeze or overt sinus or hb symptoms.   Sleeping as above  without nocturnal  or early am exacerbation  of  respiratory  c/o's or need for noct saba. Also denies any obvious fluctuation of symptoms with weather or environmental changes or other aggravating or alleviating factors except as outlined above   No unusual exposure hx or h/o childhood pna/ asthma or knowledge of premature birth.  Current Allergies, Complete Past Medical History, Past Surgical History, Family History, and Social History were reviewed in [employer] [facility] record.  ROS  The following are not active complaints unless bolded Hoarseness, sore throat, dysphagia, dental problems, itching, sneezing,  nasal congestion or discharge of excess mucus or purulent secretions, ear ache,   fever, chills, sweats, unintended wt loss or wt gain, classically pleuritic or exertional cp,  orthopnea pnd or arm/hand swelling  or leg swelling, presyncope, palpitations, abdominal pain, anorexia, nausea, vomiting, diarrhea  or change in bowel habits or change in bladder habits, change in stools or change in urine, dysuria, hematuria,  rash, arthralgias, visual complaints, headache, numbness, weakness or ataxia or problems with walking or coordination,  change in mood or  memory.        Current Meds  Medication Sig  . budesonide (RHINOCORT AQUA) 32 MCG/ACT nasal spray Place 2 sprays into both nostrils daily.  . budesonide-formoterol (SYMBICORT) 80-4.5 MCG/ACT inhaler INHALE 2 PUFFS FIRST THING IN THE MORNING AND 2 PUFFS 12 HOURS LATER  . calcium-vitamin D (OSCAL WITH D) 500-200 MG-UNIT per tablet Take 1 tablet by mouth daily with breakfast.   . carbamazepine (TEGRETOL XR) 200 MG 12 hr tablet Take 200 mg by mouth 2 (two) times daily.    . chlorpheniramine (CHLOR-TRIMETON) 4 MG tablet Take 4 mg by mouth every 4 (four) hours as needed (drippy nose, drainage, throat clearing).   . famotidine (PEPCID) 20 MG tablet Take 20 mg by mouth at bedtime as needed.   . fexofenadine (ALLEGRA) 180 MG tablet Take 180 mg by mouth every morning.   .  Guaifenesin (MUCINEX MAXIMUM STRENGTH) 1200 MG TB12 Take 1 tablet by mouth 2 (two) times daily as needed.   Marland Kitchen ibuprofen (ADVIL,MOTRIN) 400 MG tablet Take 400 mg by mouth every 8 (eight) hours as needed (joint pain).   Marland Kitchen ipratropium-albuterol (DUONEB) 0.5-2.5 (3) MG/3ML SOLN Take 3 mLs by nebulization every 4 (four) hours as needed (Wheezing/shortness of breath ((PLAN C))).  . Iron-Vitamin C 65-125 MG TABS Take 65 mg by mouth daily.  Marland Kitchen losartan-hydrochlorothiazide (HYZAAR) 50-12.5 MG per tablet Take 1 tablet by mouth every morning.   . Melatonin 3 MG TABS Take 1 tablet by mouth at bedtime.   Marland Kitchen  Multiple Vitamin (MULTIVITAMIN) tablet Take 1 tablet by mouth every morning.   . pantoprazole (PROTONIX) 40 MG tablet TAKE 1 TABLET EVERY DAY 30-60 MINUTES BEFORE FIRST MEAL OF THE DAY  . potassium chloride (K-DUR) 10 MEQ tablet Take 1 tablet by mouth daily. ((2 tabs on Mondays and Fridays))  . PROAIR HFA 108 (90 Base) MCG/ACT inhaler INHALE 2 PUFFS EVERY 4 HOURS FOR WHEEZING OR SHORTNESS OF BREATH  . Respiratory Therapy Supplies (FLUTTER) DEVI As instructed  . Turmeric 500 MG CAPS Take 1 capsule by mouth every morning.   . vitamin C (ASCORBIC ACID) 500 MG tablet Take 500 mg by mouth every morning.          Past Medical History:  Asthma  - HFA 50% November 10, 2009 > 75% December 24, 2009 > 90% March 26, 2010  Osteopenia  - hold fosfamax 09/2009 >> better with active HB so rec Reclast rx         Objective:  Physical Exam   07/14/2012   158 = baseline /   11/13/2014 162 > 02/14/2015   159 >  06/09/2015  157 > 11/13/2015  156 > 05/17/2016  161 > 08/02/2016  161 >  12/16/2016  158 > 06/13/2017  160 > 12/14/2017  95% > 02/21/2018   155    Vital signs reviewed - Note on arrival 02 sats  96% on RA    amb wf nad   HEENT: nl dentition, turbinates bilaterally, and oropharynx. Nl external ear canals without cough reflex   NECK :  without JVD/Nodes/TM/ nl carotid upstrokes bilaterally   LUNGS: no acc muscle  use,  Nl contour chest which is clear to A and P bilaterally without cough on insp or exp maneuvers   CV:  RRR  no s3 or murmur or increase in P2, and no edema   ABD:  soft and nontender with nl inspiratory excursion in the supine position. No bruits or organomegaly appreciated, bowel sounds nl  MS:  Nl gait/ ext warm without deformities, calf tenderness, cyanosis or clubbing No obvious joint restrictions   SKIN: warm and dry without lesions    NEURO:  alert, approp, nl sensorium with  no motor or cerebellar deficits apparent.           Assessment:

## 2018-02-21 NOTE — Patient Instructions (Addendum)
If cough flares, use tramadol 50 mg up to every 4 hours or up to 3 days and if not improving we need to see you right away   Please schedule a follow up visit in 6 months but call sooner if needed

## 2018-02-22 ENCOUNTER — Encounter: Payer: Self-pay | Admitting: Internal Medicine

## 2018-02-22 NOTE — Assessment & Plan Note (Addendum)
-  HFA 90% p coaching 04/23/11 - c/o hoarseness > added spacer 12/01/2012  > improved but stopped using it  - maint on symbicort 80 2bid  - try taper off gerd rx 11/13/14 > flared end of August 2016  - NO 05/21/2015  = 5  - Spirometry 05/17/2016  Nl p am symbicort 80 x 2 and doe reported   - Spirometry 06/13/2017  FEV1 1.34 (68%)  Ratio 73 p am symb 80   X 2       All goals of chronic asthma control met including optimal function and elimination of symptoms with minimal need for rescue therapy.  Contingencies discussed in full including contacting this office immediately if not controlling the symptoms using the rule of two's.     - The proper method of use, as well as anticipated side effects, of a metered-dose inhaler are discussed and demonstrated to the patient.

## 2018-02-22 NOTE — Assessment & Plan Note (Signed)
-   failed singulair around 2005  - allergy profile 08/14/2014 >  IgE  129 with pos RAST Dust >  Cat > dog   - Spirometry 05/17/2016  wnl during flare of chronic cough - gabapentin 100 mg tid as part of action plan rec 08/02/2016  - repeat Allergy profile 06/13/2017 >  Eos 0.1 /  IgE 62 same rast panel as 08/14/14 study   Still tendency to get "stuck" in a pattern of cyclical cough for short periods only that she finds she can control with very short courses of tramadol better than anything else.  I think this is reasonable as long as she is a limited amount of tramadol for this purpose based on:  Of the three most common causes of  Sub-acute / recurrent or chronic cough, only one (GERD)  can actually contribute to/ trigger  the other two (asthma and post nasal drip syndrome)  and perpetuate the cylce of cough.  While not intuitively obvious, many patients with chronic low grade reflux do not cough until there is a primary insult that disturbs the protective epithelial barrier and exposes sensitive nerve endings.   This is typically viral but can due to PNDS and  either may apply here.   The point is that once this occurs, it is difficult to eliminate the cycle  using anything but a maximally effective acid suppression regimen at least in the short run, accompanied by an appropriate diet to address non acid GERD and control / eliminate the cough itself for at least 3 days. Wit tramadol   I had an extended discussion with the patient reviewing all relevant studies completed to date and  lasting 15 to 20 minutes of a 25 minute visit     See device teaching which extended face to face time for this visit   Each maintenance medication was reviewed in detail including most importantly the difference between maintenance and prns and under what circumstances the prns are to be triggered using an action plan format that is not reflected in the computer generated alphabetically organized AVS.     Please see AVS  for specific instructions unique to this visit that I personally wrote and verbalized to the the pt in detail and then reviewed with pt  by my nurse highlighting any  changes in therapy recommended at today's visit to their plan of care.

## 2018-07-05 ENCOUNTER — Other Ambulatory Visit: Payer: Self-pay | Admitting: Internal Medicine

## 2018-08-16 ENCOUNTER — Other Ambulatory Visit: Payer: Self-pay | Admitting: Internal Medicine

## 2018-08-24 ENCOUNTER — Ambulatory Visit: Payer: BLUE CROSS/BLUE SHIELD | Admitting: Internal Medicine

## 2018-09-20 ENCOUNTER — Telehealth: Payer: Self-pay | Admitting: Internal Medicine

## 2018-09-20 ENCOUNTER — Other Ambulatory Visit: Payer: Self-pay | Admitting: Internal Medicine

## 2018-09-20 DIAGNOSIS — R058 Other specified cough: Secondary | ICD-10-CM

## 2018-09-20 DIAGNOSIS — R05 Cough: Secondary | ICD-10-CM

## 2018-09-20 MED ORDER — PANTOPRAZOLE SODIUM 40 MG PO TBEC: DELAYED_RELEASE_TABLET | ORAL | 5 refills | Status: DC

## 2018-09-20 NOTE — Telephone Encounter (Signed)
rx sent  Pt aware Nothing further needed.

## 2018-10-17 ENCOUNTER — Other Ambulatory Visit: Payer: Self-pay | Admitting: Internal Medicine

## 2018-10-25 ENCOUNTER — Ambulatory Visit (INDEPENDENT_AMBULATORY_CARE_PROVIDER_SITE_OTHER): Payer: Medicare Other | Admitting: Internal Medicine

## 2018-10-25 ENCOUNTER — Other Ambulatory Visit: Payer: Self-pay

## 2018-10-25 ENCOUNTER — Encounter: Payer: Self-pay | Admitting: Internal Medicine

## 2018-10-25 DIAGNOSIS — J45991 Cough variant asthma: Secondary | ICD-10-CM

## 2018-10-25 NOTE — Assessment & Plan Note (Signed)
Onset around 2000  - HFA 90% p coaching 04/23/11 - c/o hoarseness > added spacer 12/01/2012  > improved but stopped using it  - maint on symbicort 80 2bid  - try taper off gerd rx 11/13/14 > flared end of August 2016  - NO 05/21/2015  = 5  - Spirometry 05/17/2016  Nl p am symbicort 80 x 2 and doe reported   - Spirometry 06/13/2017  FEV1 1.34 (68%)  Ratio 73 p am symb 80   X 2          All goals of chronic asthma control met including optimal function and elimination of symptoms with minimal need for rescue therapy.  Contingencies discussed in full including contacting this office immediately if not controlling the symptoms using the rule of two's.     Since doing so well ok to try just using the symb 80 2 in am and pm doses prn Based on two studies from NEJM  378; 20 p 1865 (2018) and 380 : p2020-30 (2019) in pts with mild asthma it is reasonable to use low dose symbicort eg 80 2bid "prn" flare in this setting but I emphasized this was only shown with symbicort and takes advantage of the rapid onset of action but is not the same as "rescue therapy" but can be stopped once the acute symptoms have resolved and the need for rescue has been minimized (< 2 x weekly)     F/u q 6 m, sooner prn    Each maintenance medication was reviewed in detail including most importantly the difference between maintenance and as needed and under what circumstances the prns are to be used.  Please see AVS for specific  Instructions which are unique to this visit and I personally typed out  which were reviewed in detail in writing with the patient and a copy provided.   

## 2018-10-25 NOTE — Progress Notes (Signed)
Subjective:     Patient ID: Allison Gross, female   DOB: Jan 16, 1946   MRN: [id number]   Brief patient profile:  73 yowf quit light smoking 1973 with intermittent sinus/ bronchitis but no chronic c/o's or need for rx not much better after stopped smoking then much worse after around 2000 despite daily maint rx for asthma. Previously found to be allergic to dust, cats and trees and mold but no seasonal pattern and did not feel allergy shots helped in 1990s      History of Present Illness  09/21/2013 Acute OV  Complains of hoarseness, some increased SOB, low grade temp, dry hacking cough with occasional yellow mucus, some tightness x 1 week.  She denies any calf pain, edema, hemoptysis, orthopnea, PND, nausea, vomiting, diarrhea. Does feel, that she's had a low-grade fever. Returned yesterday from a 2 week cruise.  Says many people on the cruise were sick with cough and similar symptoms. Was in [place].  Finished zpak on 6/3 and pred pak today by ships' physician. Has been using tramadol to today for cough with minimal help rec Levaquin 500mg  daily for 7 days  Prednisone taper over next week.  Delsym 2 tsp Twice daily   For cough  Tramadol 50mg  1 every 4hr as needed for breakthrough cough.  Mucinex Twice daily  As needed  Congestion . 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 Follow-up    Cough has "pretty much disappeared".  She has not had to use her albuterol inhaler or neb recently.   Dyspnea:  Walking  Not as much but Not limited by breathing from desired activities   Cough: resolved/ off tramadol  Sleeping: fine flat bed with large pillow SABA use: occ before ex rec If cough flares, use tramadol 50 mg up to every 4 hours or up to 3 days and if not improving we need to see you right away    10/25/2018  f/u ov/Clarence Cogswell re:  cough variant asthma  maint on symb 80 2bid  Chief Complaint  Patient presents with  . Follow-up    Cough still not a bother to her and her breathing is doing well. She has not been using her albuterol often.   Dyspnea:  Back walking agin  / playing golf ok / really Not limited by breathing from desired activities   Cough: resolved  Sleeping: flat with one pillow  SABA use: none    No obvious day to day or daytime variability or assoc excess/ purulent sputum or mucus plugs or hemoptysis or cp or chest tightness, subjective wheeze or overt sinus or hb symptoms.   Sleeping ok  without nocturnal  or early am exacerbation  of respiratory  c/o's or need for noct saba. Also denies any obvious fluctuation of symptoms with weather or environmental changes or other aggravating or alleviating factors except as outlined above   No unusual exposure hx or h/o childhood pna/ asthma or knowledge of premature birth.  Current Allergies, Complete Past Medical History, Past Surgical History, Family History, and Social History were reviewed in [employer] record.  ROS  The following are not active complaints unless bolded Hoarseness, sore throat, dysphagia, dental problems, itching, sneezing,  nasal congestion or discharge of excess mucus or purulent secretions, ear ache,   fever, chills,  sweats, unintended wt loss or wt gain, classically pleuritic or exertional cp,  orthopnea pnd or arm/hand swelling  or leg swelling, presyncope, palpitations, abdominal pain, anorexia, nausea, vomiting, diarrhea  or change in bowel habits or change in bladder habits, change in stools or change in urine, dysuria, hematuria,  rash, arthralgias, visual complaints, headache, numbness, weakness or ataxia or problems with walking or coordination,  change in mood or  memory.        Current Meds  Medication Sig  . budesonide (RHINOCORT AQUA) 32 MCG/ACT nasal spray Place 2 sprays into both nostrils daily.  .  budesonide-formoterol (SYMBICORT) 80-4.5 MCG/ACT inhaler INHALE 2 PUFFS FIRST THING IN THE MORNING AND 2 PUFFS 12 HOURS LATER  . calcium-vitamin D (OSCAL WITH D) 500-200 MG-UNIT per tablet Take 1 tablet by mouth daily with breakfast.   . carbamazepine (TEGRETOL XR) 200 MG 12 hr tablet Take 200 mg by mouth 2 (two) times daily.    . chlorpheniramine (CHLOR-TRIMETON) 4 MG tablet Take 4 mg by mouth every 4 (four) hours as needed (drippy nose, drainage, throat clearing).   . famotidine (PEPCID) 20 MG tablet Take 20 mg by mouth at bedtime as needed.   . fexofenadine (ALLEGRA) 180 MG tablet Take 180 mg by mouth every morning.   . Guaifenesin (MUCINEX MAXIMUM STRENGTH) 1200 MG TB12 Take 1 tablet by mouth 2 (two) times daily as needed.   Marland Kitchen ibuprofen (ADVIL,MOTRIN) 400 MG tablet Take 400 mg by mouth every 8 (eight) hours as needed (joint pain).   Marland Kitchen ipratropium-albuterol (DUONEB) 0.5-2.5 (3) MG/3ML SOLN Take 3 mLs by nebulization every 4 (four) hours as needed (Wheezing/shortness of breath ((PLAN C))).  . Iron-Vitamin C 65-125 MG TABS Take 65 mg by mouth daily.  Marland Kitchen losartan-hydrochlorothiazide (HYZAAR) 50-12.5 MG per tablet Take 1 tablet by mouth every morning.   . Melatonin 3 MG TABS Take 1 tablet by mouth at bedtime.   . Multiple Vitamin (MULTIVITAMIN) tablet Take 1 tablet by mouth every morning.   . pantoprazole (PROTONIX) 40 MG tablet TAKE 1 TABLET EVERY DAY 30-60 MINUTES BEFORE FIRST MEAL OF THE DAY  . potassium chloride (K-DUR) 10 MEQ tablet Take 1 tablet by mouth daily. ((2 tabs on Mondays and Fridays))  . PROAIR HFA 108 (90 Base) MCG/ACT inhaler INHALE 2 PUFFS EVERY 4 HOURS AS NEEDED FOR WHEEZING AND SHORTNESS OF BREATH.  Marland Kitchen Respiratory Therapy Supplies (FLUTTER) DEVI As instructed  . traMADol (ULTRAM) 50 MG tablet Take 1 tablet (50 mg total) by mouth every 4 (four) hours as needed (for cough or pain).  . Turmeric 500 MG CAPS Take 1 capsule by mouth every morning.   . vitamin C (ASCORBIC ACID) 500 MG  tablet Take 500 mg by mouth every morning.              Past Medical History:  Asthma  - HFA 50% November 10, 2009 > 75% December 24, 2009 > 90% March 26, 2010  Osteopenia  - hold fosfamax 09/2009 >> better with active HB so rec Reclast rx         Objective:  Physical Exam   07/14/2012   158 = baseline /   11/13/2014 162 > 02/14/2015   159 >  06/09/2015  157 > 11/13/2015  156 > 05/17/2016  161 > 08/02/2016  161 >  12/16/2016  158 > 06/13/2017  160 > 12/14/2017  95% > 02/21/2018   155 > 10/25/2018  158    Vital signs reviewed - Note on  arrival 02 sats  97% on RA    HEENT: nl dentition, turbinates bilaterally, and oropharynx. Nl external ear canals without cough reflex   NECK :  without JVD/Nodes/TM/ nl carotid upstrokes bilaterally   LUNGS: no acc muscle use,  Nl contour chest which is clear to A and P bilaterally without cough on insp or exp maneuvers   CV:  RRR  no s3 or murmur or increase in P2, and no edema   ABD:  soft and nontender with nl inspiratory excursion in the supine position. No bruits or organomegaly appreciated, bowel sounds nl  MS:  Nl gait/ ext warm without deformities, calf tenderness, cyanosis or clubbing No obvious joint restrictions   SKIN: warm and dry without lesions    NEURO:  alert, approp, nl sensorium with  no motor or cerebellar deficits apparent.                  Assessment:

## 2018-10-25 NOTE — Patient Instructions (Addendum)
Ok to reduce symbicort 80 to 2 pffs in am-   the pm doses are optional but for any flare resume the two puffs every 12 hours for at least week then resume 2 puffs in am only   Please schedule a follow up visit in 6  months but call sooner if needed

## 2018-11-18 ENCOUNTER — Other Ambulatory Visit: Payer: Self-pay | Admitting: Internal Medicine

## 2019-01-15 ENCOUNTER — Telehealth: Payer: Self-pay | Admitting: Internal Medicine

## 2019-01-15 MED ORDER — BUDESONIDE 32 MCG/ACT NA SUSP: 2.0000 | Freq: Every day | NASAL | 5 refills | Status: DC

## 2019-01-15 NOTE — Telephone Encounter (Signed)
Spoke with patient. She stated that she needed a refill on her budesonide nasal spray to be sent to [facility]. Advised patient that I would go ahead and send this in for her. She verbalized understanding. Nothing further needed at time of call.

## 2019-04-25 ENCOUNTER — Other Ambulatory Visit: Payer: Self-pay

## 2019-04-25 ENCOUNTER — Ambulatory Visit (INDEPENDENT_AMBULATORY_CARE_PROVIDER_SITE_OTHER): Payer: Medicare Other | Admitting: Internal Medicine

## 2019-04-25 ENCOUNTER — Encounter: Payer: Self-pay | Admitting: Internal Medicine

## 2019-04-25 DIAGNOSIS — J45991 Cough variant asthma: Secondary | ICD-10-CM | POA: Diagnosis not present

## 2019-04-25 DIAGNOSIS — R05 Cough: Secondary | ICD-10-CM

## 2019-04-25 NOTE — Assessment & Plan Note (Signed)
Onset around 2000  - HFA 90% p coaching 04/23/11 - c/o hoarseness > added spacer 12/01/2012  > improved but stopped using it  - maint on symbicort 80 2bid  - try taper off gerd rx 11/13/14 > flared end of August 2016  - NO 05/21/2015  = 5  - Spirometry 05/17/2016  Nl p am symbicort 80 x 2 and doe reported   - Spirometry 06/13/2017  FEV1 1.34 (68%)  Ratio 73 p am symb 80   X 2      All goals of chronic asthma control met including optimal function and elimination of symptoms with minimal need for rescue therapy. Ok to reduce symb 80 to 2 pffs in am and  0 -2 in pm if continue to do so well   Contingencies discussed in full including contacting this office immediately if not controlling the symptoms using the rule of two's.     Pt informed of the seriousness of COVID 19 infection as a direct risk to lung health  and safey and to close contacts and should continue to wear a facemask in public and minimize exposure to public locations but especially avoid any area or activity where non-close contacts are not observing distancing or wearing an appropriate face mask.  I strongly recommended vaccine when offered.    >>> f/u in 6 m to avoid covid 19 exp in clinic - call sooner if needed  Each maintenance medication was reviewed in detail including most importantly the difference between maintenance and as needed and under what circumstances the prns are to be used.  Please see AVS for specific  Instructions which are unique to this visit and I personally typed out  which were reviewed in detail over the phone with the patient and a copy provided via MyChart

## 2019-04-25 NOTE — Patient Instructions (Signed)
No change in medications    Please schedule a follow up visit in 6  months but call sooner if needed  

## 2019-04-25 NOTE — Progress Notes (Signed)
Subjective:     Patient ID: Allison Gross, female   DOB: 10-31-1945   MRN: [id number]   Brief patient profile:  73 yowf quit light smoking 1973 with intermittent sinus/ bronchitis but no chronic c/o's or need for rx not much better after stopped smoking then much worse after around 2000 despite daily maint rx for asthma. Previously found to be allergic to dust, cats and trees and mold but no seasonal pattern and did not feel allergy shots helped in 1990s      History of Present Illness  09/21/2013 Acute OV  Complains of hoarseness, some increased SOB, low grade temp, dry hacking cough with occasional yellow mucus, some tightness x 1 week.  She denies any calf pain, edema, hemoptysis, orthopnea, PND, nausea, vomiting, diarrhea. Does feel, that she's had a low-grade fever. Returned yesterday from a 2 week cruise.  Says many people on the cruise were sick with cough and similar symptoms. Was in [employer].  Finished zpak on 6/3 and pred pak today by ships' physician. Has been using tramadol to today for cough with minimal help rec Levaquin 500mg  daily for 7 days  Prednisone taper over next week.  Delsym 2 tsp Twice daily   For cough  Tramadol 50mg  1 every 4hr as needed for breakthrough cough.  Mucinex Twice daily  As needed  Congestion . 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 Follow-up    Cough has "pretty much disappeared".  She has not had to use her albuterol inhaler or neb recently.   Dyspnea:  Walking  Not as much but Not limited by breathing from desired activities   Cough: resolved/ off tramadol  Sleeping: fine flat bed with large pillow SABA use: occ before ex rec If cough flares, use tramadol 50 mg up to every 4 hours or up to 3 days and if not improving we need to see you right away    10/25/2018  f/u ov/Allison Gross re:  cough variant asthma  maint on symb 80 2bid  Chief Complaint  Patient presents with  . Follow-up    Cough still not a bother to her and her breathing is doing well. She has not been using her albuterol often.   Dyspnea:  Back walking agin  / playing golf ok / really Not limited by breathing from desired activities   Cough: resolved  Sleeping: flat with one pillow  SABA use: none rec Ok to reduce symbicort 80 to 2 pffs in am-   the pm doses are optional but for any flare resume the two puffs every 12 hours for at least week then resume 2 puffs in am only   Virtual Visit via Telephone Note 04/25/2019   I connected with Scot Dock on 04/25/19 at  12:45 PM EST by telephone and verified that I am speaking with the correct person using two identifiers.   I discussed the limitations, risks, security and privacy concerns of performing an evaluation and management service by telephone and the availability of in person appointments. I also discussed with the patient that there may be a patient responsible charge related to this service. The patient expressed understanding and agreed to proceed.   History of Present Illness: Dyspnea: Not limited by breathing from desired activities but relatively sedentary this winter Cough: none / no  need for tramadol / not even needing chlortabs just allegra daily Sleeping: flat on one pillow  SABA use: none  02:  None    No obvious day to day or daytime variability or assoc excess/ purulent sputum or mucus plugs or hemoptysis or cp or chest tightness, subjective wheeze or overt sinus or hb symptoms.    Also denies any obvious fluctuation of symptoms with weather or environmental changes or other aggravating or alleviating factors except as outlined above.   Meds reviewed/ med reconciliation completed         Past Medical History:  Asthma  - HFA 50% November 10, 2009 > 75% December 24, 2009 > 90% March 26, 2010  Osteopenia  - hold fosfamax 09/2009 >>  better with active HB so rec Reclast rx       Observations/Objective: Upbeat, no conversational sob, no spont cough or throat clearing with good voice texture    Assessment and Plan: See problem list for active a/p's   Follow Up Instructions: See avs for instructions unique to this ov which includes revised/ updated med list     I discussed the assessment and treatment plan with the patient. The patient was provided an opportunity to ask questions and all were answered. The patient agreed with the plan and demonstrated an understanding of the instructions.   The patient was advised to call back or seek an in-person evaluation if the symptoms worsen or if the condition fails to improve as anticipated.  I provided 15 minutes of non-face-to-face time during this encounter.   Sandrea Hughs, MD

## 2019-04-26 ENCOUNTER — Ambulatory Visit: Payer: PRIVATE HEALTH INSURANCE | Admitting: Internal Medicine

## 2019-05-01 ENCOUNTER — Other Ambulatory Visit: Payer: Self-pay | Admitting: Internal Medicine

## 2019-05-01 DIAGNOSIS — R058 Other specified cough: Secondary | ICD-10-CM

## 2019-05-01 DIAGNOSIS — R05 Cough: Secondary | ICD-10-CM

## 2019-10-29 ENCOUNTER — Other Ambulatory Visit: Payer: Self-pay | Admitting: Internal Medicine

## 2019-11-21 ENCOUNTER — Other Ambulatory Visit: Payer: Self-pay | Admitting: Internal Medicine

## 2019-12-05 ENCOUNTER — Other Ambulatory Visit: Payer: Self-pay | Admitting: Internal Medicine

## 2019-12-18 ENCOUNTER — Other Ambulatory Visit: Payer: Self-pay | Admitting: Internal Medicine

## 2019-12-28 ENCOUNTER — Encounter: Payer: Self-pay | Admitting: Neurology

## 2019-12-31 ENCOUNTER — Ambulatory Visit (INDEPENDENT_AMBULATORY_CARE_PROVIDER_SITE_OTHER): Payer: Medicare Other | Admitting: Neurology

## 2019-12-31 ENCOUNTER — Encounter: Payer: Self-pay | Admitting: Neurology

## 2019-12-31 ENCOUNTER — Other Ambulatory Visit: Payer: Self-pay

## 2019-12-31 VITALS — BP 136/84 | HR 78 | Ht 59.0 in | Wt 155.8 lb

## 2019-12-31 DIAGNOSIS — G40009 Localization-related (focal) (partial) idiopathic epilepsy and epileptic syndromes with seizures of localized onset, not intractable, without status epilepticus: Secondary | ICD-10-CM

## 2019-12-31 DIAGNOSIS — Z79899 Other long term (current) drug therapy: Secondary | ICD-10-CM

## 2019-12-31 DIAGNOSIS — M8588 Other specified disorders of bone density and structure, other site: Secondary | ICD-10-CM

## 2019-12-31 DIAGNOSIS — G459 Transient cerebral ischemic attack, unspecified: Secondary | ICD-10-CM

## 2019-12-31 DIAGNOSIS — M8580 Other specified disorders of bone density and structure, unspecified site: Secondary | ICD-10-CM | POA: Diagnosis not present

## 2019-12-31 MED ORDER — CARBAMAZEPINE ER 200 MG PO TB12: 200.0000 mg | ORAL_TABLET | Freq: Two times a day (BID) | ORAL | 3 refills | Status: DC

## 2019-12-31 NOTE — Patient Instructions (Signed)
1. Schedule MRI brain with and without contrast, MRA head without contrast, MRA neck with and without contrast  2. Bloodwork for BUN, creatinine, lipid panel, HbA1c  3. Start daily aspirin 81mg   4. Continue Carbamazepine ER 200mg  twice a day  5. If symptoms recur with no improvement, go to ER immediately  6. We may consider doing a nerve test if scans are normal and symptoms recur while on aspirin  7. Follow-up in 6 months, call for any changes   Seizure Precautions: 1. If medication has been prescribed for you to prevent seizures, take it exactly as directed.  Do not stop taking the medicine without talking to your doctor first, even if you have not had a seizure in a long time.   2. Avoid activities in which a seizure would cause danger to yourself or to others.  Don't operate dangerous machinery, swim alone, or climb in high or dangerous places, such as on ladders, roofs, or girders.  Do not drive unless your doctor says you may.  3. If you have any warning that you may have a seizure, lay down in a safe place where you can't hurt yourself.    4.  No driving for 6 months from last seizure, as per [facility].   Please refer to the following link on the [facility] website for more information: [email or web address]   5.  Maintain good sleep hygiene.  6.  Contact your doctor if you have any problems that may be related to the medicine you are taking.  7.  Call 911 and bring the patient back to the ED if:        A.  The seizure lasts longer than 5 minutes.       B.  The patient doesn't awaken shortly after the seizure  C.  The patient has new problems such as difficulty seeing, speaking or moving  D.  The patient was injured during the seizure  E.  The patient has a temperature over 102 F (39C)  F.  The patient vomited and now is having trouble breathing

## 2019-12-31 NOTE — Progress Notes (Signed)
NEUROLOGY CONSULTATION NOTE  Allison Gross MRN: [id number] DOB: 1945/08/01  Referring provider: Dr. Phylliss Bob Primary care provider: Dr. Sandrea Hughs  Reason for consult:  seizure  Dear Dr Rockne Menghini:  Thank you for your kind referral of Allison Gross for consultation of the above symptoms. Although her history is well known to you, please allow me to reiterate it for the purpose of our medical record. She is alone in the office today. Records and images were personally reviewed where available.   HISTORY OF PRESENT ILLNESS: This is a pleasant 74 year old right-handed woman with a history of hypertension, complex partial seizures, presenting for second opinion. She had been seeing neurologist Dr. Maryln Gottron since at least 2013, records were reviewed. She was last seen in January 2021 with note of well-controlled seizures since on carbamazepine ER 200mg  BID. Her last carbamazepine level in January 2021 was 8.5. She reports having brief passing out episodes at age 74. She had preeclampsia with her first child in 16 and had post-partum convulsions. 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 It was not noticeable to others, she just held on to the table. She had 2 or 3 of them and was seen by Neurology and told there was a lesion on the left side. She was started on carbamazepine. She reports the last seizure was in 2000 and at that time dose was increased to carbamazepine ER 200mg  BID. She denies any side effects. Her last bone density scan done 8 years ago showed osteopenia, she was briefly on Fosamax, which was stopped by another provider. She denies any staring/unresponsive episodes, gaps in time, olfactory/gustatory hallucinations, myoclonic jerks. She denies any headaches, dizziness, diplopia, dysarthria/dysphagia, neck/back pain, bowel/bladder dysfunction. Her last fall was 2 months ago.    After she was referred and scheduled for this visit, she has also had 2 recent episodes that occurred a week apart at the end of August. The first episode occurred while she was holding the TV remote control with her left hand and she had weakness in her left hand and a heaviness in her left arm. It felt like it went out for a minute, no numbness/tingling/pain, then she felt fine. No associated headache, confusion. The second episode was at the end of August, she was typing on her computer then had the same sensation of weakness/heaviness in her left arm lasting 45-60 seconds. She also had one episode at night where there was a burning/itchiness on her left hand radiating to her fingers that lasted for 10 minutes. Face and leg were not affected. She reports having normal cholesterol levels when checked by her cardiologist, LDL in the 30s. Last bloodwork was over a year ago. She reports having an echocardiogram 4-6 weeks ago, reportedly normal.   Epilepsy Risk Factors:  She had a normal birth and early development.  There is no history of febrile convulsions, CNS infections such as meningitis/encephalitis, significant traumatic brain injury, neurosurgical procedures, or family history of seizures.    PAST MEDICAL HISTORY: Past Medical History:  Diagnosis Date  . Asthma   . Cancer (HCC)    basil,squamous  . Hypertension   . Osteopenia   . Seizures (HCC) 15 years ago   1982's from childbirth    PAST SURGICAL HISTORY: Past Surgical History:  Procedure Laterality Date  . CHOLECYSTECTOMY    . HERNIA REPAIR    . MOHS SURGERY    .  OOPHORECTOMY  2005   ET tube 7 1/2 was too big per pt/made pt have asthma attack. per pt  . VAGINAL HYSTERECTOMY      MEDICATIONS: Current Outpatient Medications on File Prior to Visit  Medication Sig Dispense Refill  . budesonide (RHINOCORT AQUA) 32 MCG/ACT nasal spray USE TWO SPRAYS IN EACH NOSTRIL EVERY DAY. 8.43 mL 0  . budesonide-formoterol (SYMBICORT)  80-4.5 MCG/ACT inhaler INHALE 2 PUFFS FIRST THING IN THE MORNING AND 2 PUFFS 12 HOURS LATER 10.2 g 2  . calcium-vitamin D (OSCAL WITH D) 500-200 MG-UNIT per tablet Take 1 tablet by mouth daily with breakfast.     . carbamazepine (TEGRETOL XR) 200 MG 12 hr tablet Take 200 mg by mouth 2 (two) times daily.      . chlorpheniramine (CHLOR-TRIMETON) 4 MG tablet Take 4 mg by mouth every 4 (four) hours as needed (drippy nose, drainage, throat clearing).     . fexofenadine (ALLEGRA) 180 MG tablet Take 180 mg by mouth every morning.     Marland Kitchen ibuprofen (ADVIL,MOTRIN) 400 MG tablet Take 400 mg by mouth every 8 (eight) hours as needed (joint pain).     . Iron-Vitamin C 65-125 MG TABS Take 65 mg by mouth daily.    Marland Kitchen losartan-hydrochlorothiazide (HYZAAR) 50-12.5 MG per tablet Take 1 tablet by mouth every morning.     . melatonin 5 MG TABS Take 5 mg by mouth at bedtime.     . Multiple Vitamin (MULTIVITAMIN) tablet Take 1 tablet by mouth every morning.     . pantoprazole (PROTONIX) 40 MG tablet TAKE 1 TABLET BY MOUTH 30-60 MINUTES BEFORE FIRST MEAL OF THE DAY 30 tablet 11  . potassium chloride (K-DUR) 10 MEQ tablet Take 1 tablet by mouth daily. ((2 tabs on Mondays and Fridays))    . PROAIR HFA 108 (90 Base) MCG/ACT inhaler INHALE 2 PUFFS EVERY 4 HOURS AS NEEDED FOR WHEEZING AND SHORTNESS OF BREATH. 8.5 g 0  . Respiratory Therapy Supplies (FLUTTER) DEVI As instructed 1 each 0  . Turmeric 500 MG CAPS Take 1 capsule by mouth every morning.     . vitamin C (ASCORBIC ACID) 500 MG tablet Take 500 mg by mouth every morning.     . famotidine (PEPCID) 20 MG tablet Take 20 mg by mouth at bedtime as needed.  (Patient not taking: Reported on 12/31/2019)    . Guaifenesin (MUCINEX MAXIMUM STRENGTH) 1200 MG TB12 Take 1 tablet by mouth 2 (two) times daily as needed.  (Patient not taking: Reported on 12/31/2019)    . ipratropium-albuterol (DUONEB) 0.5-2.5 (3) MG/3ML SOLN Take 3 mLs by nebulization every 4 (four) hours as needed  (Wheezing/shortness of breath ((PLAN C))). (Patient not taking: Reported on 12/31/2019) 360 mL 2   Current Facility-Administered Medications on File Prior to Visit  Medication Dose Route Frequency Provider Last Rate Last Admin  . 0.9 %  sodium chloride infusion  500 mL Intravenous Continuous Meryl Dare, MD        ALLERGIES: Allergies  Allergen Reactions  . Sulfonamide Derivatives Nausea And Vomiting    FAMILY HISTORY: Family History  Problem Relation Age of Onset  . Colon cancer Neg Hx     SOCIAL HISTORY: Social History   Socioeconomic History  . Marital status: Married    Spouse name: Not on file  . Number of children: Not on file  . Years of education: Not on file  . Highest education level: Not on file  Occupational History  . Not  on file  Tobacco Use  . Smoking status: Former Smoker    Packs/day: 0.10    Years: 5.00    Pack years: 0.50    Types: Cigarettes    Quit date: 04/19/1984    Years since quitting: 35.7  . Smokeless tobacco: Never Used  . Tobacco comment: reports smoked socially  Substance and Sexual Activity  . Alcohol use: Yes    Comment: occasional wine  . Drug use: No  . Sexual activity: Not on file  Other Topics Concern  . Not on file  Social History Narrative  . Not on file   Social Determinants of Health   Financial Resource Strain:   . Difficulty of Paying Living Expenses: Not on file  Food Insecurity:   . Worried About Programme researcher, broadcasting/film/video in the Last Year: Not on file  . Ran Out of Food in the Last Year: Not on file  Transportation Needs:   . Lack of Transportation (Medical): Not on file  . Lack of Transportation (Non-Medical): Not on file  Physical Activity:   . Days of Exercise per Week: Not on file  . Minutes of Exercise per Session: Not on file  Stress:   . Feeling of Stress : Not on file  Social Connections:   . Frequency of Communication with Friends and Family: Not on file  . Frequency of Social Gatherings with Friends  and Family: Not on file  . Attends Religious Services: Not on file  . Active Member of Clubs or Organizations: Not on file  . Attends Banker Meetings: Not on file  . Marital Status: Not on file  Intimate Partner Violence:   . Fear of Current or Ex-Partner: Not on file  . Emotionally Abused: Not on file  . Physically Abused: Not on file  . Sexually Abused: Not on file    PHYSICAL EXAM: Vitals:   12/31/19 1251  BP: 136/84  Pulse: 78  SpO2: 97%   General: No acute distress Head:  Normocephalic/atraumatic Skin/Extremities: No rash, no edema Neurological Exam: Mental status: alert and oriented to person, place, and time, no dysarthria or aphasia, Fund of knowledge is appropriate.  Recent and remote memory are intact.  Attention and concentration are normal. Cranial nerves: CN I: not tested CN II: pupils equal, round and reactive to light, visual fields intact CN III, IV, VI:  full range of motion, no nystagmus, no ptosis CN V: facial sensation intact CN VII: upper and lower face symmetric CN VIII: hearing intact to conversation CN IX, X: gag intact, uvula midline CN XI: sternocleidomastoid and trapezius muscles intact CN XII: tongue midline Bulk & Tone: normal, no fasciculations. Motor: 5/5 throughout with no pronator drift. Sensation: intact to light touch, cold, pin, vibration sense.  No extinction to double simultaneous stimulation.  Romberg test negative Deep Tendon Reflexes: +2 throughout except for +1 bilateral ankle jerks, no ankle clonus Plantar responses: downgoing bilaterally Cerebellar: no incoordination on finger to nose testing Gait: narrow-based and steady, able to tandem walk adequately. Tremor: none   IMPRESSION: This is a pleasant 74 year old right-handed woman with a history of hypertension, complex partial seizures, presenting for second opinion. She has been seizure-free since 2000 on carbamazepine ER 200mg  BID, seizures suggestive of focal  seizures possibly arising from the right hemisphere (turning to the left), although she reports being told of a lesion on the left side many years ago. Continue current medication, we discussed monitoring bone health with long-term carbamazepine use,  check bone density scan. She has also had 2 brief episodes of left arm/hand weakness/heaviness, last episode was end of August. They are brief to be TIAs, however with recurrent symptoms, TIA workup will be done. MRI brain with and without constrast, MRA head and neck will be ordered to assess for underlying structural abnormality. She had a reportedly normal echocardiogram 4-6 weeks ago. She was advised to start daily aspirin 81mg , continue control of vascular risk factors. Check lipid panel and HbA1c. If imaging is normal and symptoms recur on aspirin, we may consider doing an EMG/NCV of the left upper extremity to assess for peripheral cause. She knows to go to the ER immediately for any sudden change in symptoms. Follow-up in 6 months, she knows to call for any changes.    Thank you for allowing me to participate in the care of this patient. Please do not hesitate to call for any questions or concerns.   Patrcia Dolly, M.D.  CC: Dr. Rockne Menghini, Dr. Sherene Sires

## 2019-12-31 NOTE — Addendum Note (Signed)
Addended by: Jake Seats on: 12/31/2019 02:43 PM   Modules accepted: Orders

## 2020-01-02 ENCOUNTER — Other Ambulatory Visit: Payer: Self-pay | Admitting: Neurology

## 2020-01-02 ENCOUNTER — Telehealth: Payer: Self-pay | Admitting: Neurology

## 2020-01-02 MED ORDER — CARBAMAZEPINE ER 200 MG PO CP12: 200.0000 mg | ORAL_CAPSULE | Freq: Two times a day (BID) | ORAL | 3 refills | Status: DC

## 2020-01-02 NOTE — Telephone Encounter (Signed)
Pt called wasn't at home message left to call the office back. She was given lab slips at her appointment to go have lab work done, her orders for the imagining center in [place]  have been faxed

## 2020-01-02 NOTE — Telephone Encounter (Signed)
Patient needs to having imaging done but wasn't given the correct forms for the imaging center and they told her they don't have the orders. She is wanting to have the imaging done in [place], [place]. Please fax orders to facility and call pt when done.

## 2020-01-02 NOTE — Telephone Encounter (Signed)
Patient returned call and I read her the note from [facility]. She expressed understanding and appreciation.

## 2020-01-07 ENCOUNTER — Telehealth: Payer: Self-pay | Admitting: Neurology

## 2020-01-07 NOTE — Telephone Encounter (Signed)
Labs received:  Labs from [facility] done 01/07/20: LDL 106, total cholesterol 233 HbA1c 5.4

## 2020-01-08 ENCOUNTER — Telehealth: Payer: Self-pay | Admitting: Neurology

## 2020-01-08 ENCOUNTER — Telehealth: Payer: Self-pay

## 2020-01-08 NOTE — Telephone Encounter (Signed)
Pt called see other phone note

## 2020-01-08 NOTE — Telephone Encounter (Signed)
-----   Message from Cameron Sprang, MD sent at 01/07/2020 11:05 AM EDT ----- Pls let her know I received bloodwork results. Her cholesterol level is elevated, sugar control is normal. Would recommend she speak to her cardiologist about the elevated cholesterol since she does not have a PCP. Pls ask who her cardiologist is so we can forward results. Results are in Media tab.  Thanks! ----- Message ----- From: Virl Son Sent: 01/07/2020  10:47 AM EDT To: Cameron Sprang, MD  Labs

## 2020-01-08 NOTE — Telephone Encounter (Signed)
Patient called for recent lab results

## 2020-01-08 NOTE — Telephone Encounter (Signed)
Spoke with pt informed her that Her cholesterol level is elevated, sugar control is normal. Would recommend she speak to her cardiologist about the elevated cholesterol since she does not have a PCP. Pt stated her cholesterol is always high, she will follow up with her cardiologist, she stated that she will have [facility] fax results the her cardiologist we did not need to.

## 2020-01-22 ENCOUNTER — Telehealth: Payer: Self-pay | Admitting: Neurology

## 2020-01-22 NOTE — Telephone Encounter (Signed)
Patient called regarding her imaging results. She states she was also given a cd copy if Dr. Delice Lesch would like to see it she can bring it with her. Please call.

## 2020-01-22 NOTE — Telephone Encounter (Signed)
[facility] is faxing over results at this time, pt is going to bring CD by the office one day this week.

## 2020-01-22 NOTE — Telephone Encounter (Signed)
I don't have her reports, can you pls request for copy of report and if she can drop by CD so I can review, that would be great. Thanks

## 2020-01-24 ENCOUNTER — Telehealth: Payer: Self-pay | Admitting: Neurology

## 2020-01-24 NOTE — Telephone Encounter (Signed)
Pt called no answer voice mail left that we have her results will call her back when Dr Delice Lesch gets to look over it ,

## 2020-01-24 NOTE — Telephone Encounter (Signed)
Patient called in and left a message. She is wanting to make sure we received her MRI results?

## 2020-02-07 ENCOUNTER — Telehealth: Payer: Self-pay | Admitting: Neurology

## 2020-02-07 NOTE — Telephone Encounter (Signed)
Patient called in and left a message wanting the results of her MRI.

## 2020-02-08 ENCOUNTER — Telehealth: Payer: Self-pay | Admitting: Neurology

## 2020-02-08 NOTE — Telephone Encounter (Signed)
See other phone note

## 2020-02-08 NOTE — Telephone Encounter (Signed)
Spoke with pt, her  MRI brain and neck did not show any evidence of stroke, bleed, blood clot, or significant narrowing of blood vessels in the brain. There is an incidental finding of a small overgrowth of cells in the nerve in the right ear region, not in the brain. It is incidental because we did the brain scan for a different reason and incidentally saw these  changes. If she has not had a hearing evaluation done, pls have hearing evaluation. The main thing we do for these changes is a follow-up MRI in a year. It would not cause her symptoms pt verbalized understanding ,

## 2020-02-08 NOTE — Telephone Encounter (Signed)
Pls let her know that the MRI brain and neck did not show any evidence of stroke, bleed, blood clot, or significant narrowing of blood vessels in the brain. There is an incidental finding of a small overgrowth of cells in the nerve in the right ear region, not in the brain. It is incidental because we did the brain scan for a different reason and incidentally saw these  changes. If she has not had a hearing evaluation done, pls have hearing evaluation. The main thing we do for these changes is a follow-up MRI in a year. It would not cause her symptoms. Thanks

## 2020-02-08 NOTE — Telephone Encounter (Signed)
Pt called no answer LVM to call the office back  

## 2020-02-08 NOTE — Telephone Encounter (Signed)
Patient called in stating she was returning a call from [place].

## 2020-04-07 ENCOUNTER — Other Ambulatory Visit: Payer: Self-pay | Admitting: Internal Medicine

## 2020-04-08 ENCOUNTER — Ambulatory Visit: Payer: PRIVATE HEALTH INSURANCE | Admitting: Neurology

## 2020-04-19 HISTORY — PX: CATARACT EXTRACTION: SUR2

## 2020-05-13 ENCOUNTER — Telehealth: Payer: Self-pay | Admitting: Gastroenterology

## 2020-05-13 NOTE — Telephone Encounter (Signed)
Patient had a hernia repair 54 + years ago.  She has another hernia at the site.  > She saw a Psychologist, sport and exercise in [place], but does not wish to continue with them in [place]. Requesting a referral to a surgeon in [place].  Patient notified she will need to have a referral for [facility] and we have not seen her since 2018 and only for a screening procedure.  She is advised we can't refer her for a problem we have never seen her for.  She is advised she should request referral from her PCP or she could request [facility] surgeon to refer her for a second opinion. She does not know what kind of hernia it is. She thanked me for the call

## 2020-05-13 NOTE — Telephone Encounter (Signed)
Inbound call from patient requesting a call back please.  States she has a hernia which she knows we can not see her for here but is wanting to know if we can recommend someone in the area as she does not want to see someone in [place].  Please advise.

## 2020-05-14 ENCOUNTER — Telehealth: Payer: Self-pay | Admitting: Internal Medicine

## 2020-05-14 DIAGNOSIS — K469 Unspecified abdominal hernia without obstruction or gangrene: Secondary | ICD-10-CM

## 2020-05-14 NOTE — Telephone Encounter (Signed)
I have called and spoke with pt and she is aware of yearly follow up that has been scheduled per her request 2/15.  Pt is aware of the referral that has been placed.

## 2020-05-14 NOTE — Telephone Encounter (Signed)
Refer to general surgery.

## 2020-05-14 NOTE — Telephone Encounter (Signed)
MW please advise on the referral for the hernia.  There is no mention of hernia at her last OV on 04/25/2019.

## 2020-05-26 ENCOUNTER — Other Ambulatory Visit: Payer: Self-pay | Admitting: Internal Medicine

## 2020-05-31 ENCOUNTER — Other Ambulatory Visit: Payer: Self-pay | Admitting: Internal Medicine

## 2020-05-31 DIAGNOSIS — R058 Other specified cough: Secondary | ICD-10-CM

## 2020-06-03 ENCOUNTER — Ambulatory Visit (INDEPENDENT_AMBULATORY_CARE_PROVIDER_SITE_OTHER): Payer: Medicare Other | Admitting: Internal Medicine

## 2020-06-03 ENCOUNTER — Encounter: Payer: Self-pay | Admitting: Internal Medicine

## 2020-06-03 ENCOUNTER — Other Ambulatory Visit: Payer: Self-pay

## 2020-06-03 DIAGNOSIS — J45991 Cough variant asthma: Secondary | ICD-10-CM

## 2020-06-03 NOTE — Assessment & Plan Note (Signed)
Onset around 2000  - HFA 90% p coaching 04/23/11 - c/o hoarseness > added spacer 12/01/2012  > improved but stopped using it  - maint on symbicort 80 2bid  - try taper off gerd rx 11/13/14 > flared end of August 2016  - NO 05/21/2015  = 5  - Spirometry 05/17/2016  Nl p am symbicort 80 x 2 and doe reported   - Spirometry 06/13/2017  FEV1 1.34 (68%)  Ratio 73 p am symb 80   X 2  All goals of chronic asthma control met including optimal function and elimination of symptoms with minimal need for rescue therapy on symbicort 80 2 bid with minimal / tolerable irritation of the upper airway  Contingencies discussed in full including contacting this office immediately if not controlling the symptoms using the rule of two's.     F/u yearly, sooner prn          Each maintenance medication was reviewed in detail including emphasizing most importantly the difference between maintenance and prns and under what circumstances the prns are to be triggered using an action plan format where appropriate.  Total time for H and P, chart review, counseling, reviewing hfa device(s) and generating customized AVS unique to this office visit / same day charting = 20 min

## 2020-06-03 NOTE — Patient Instructions (Signed)
No change in medications needed but take pepcid 20 mg in evening from now until the day of the surgery as well as symbcort  Please schedule a follow up visit in 12  months but call sooner if needed - ok to schedule in [place]

## 2020-06-03 NOTE — Progress Notes (Signed)
Subjective:     Patient ID: Allison Gross, female   DOB: 1946-01-01   MRN: [id number]   Brief patient profile:  46   yowf quit light smoking 1973 with intermittent sinus/ bronchitis but no chronic c/o's or need for rx not much better after stopped smoking then much worse after around 2000 despite daily maint rx for asthma. Previously found to be allergic to dust, cats and trees and mold but no seasonal pattern and did not feel allergy shots helped in 1990s      History of Present Illness  09/21/2013 Acute OV  Complains of hoarseness, some increased SOB, low grade temp, dry hacking cough with occasional yellow mucus, some tightness x 1 week.  She denies any calf pain, edema, hemoptysis, orthopnea, PND, nausea, vomiting, diarrhea. Does feel, that she's had a low-grade fever. Returned yesterday from a 2 week cruise.  Says many people on the cruise were sick with cough and similar symptoms. Was in [place].  Finished zpak on 6/3 and pred pak today by ships' physician. Has been using tramadol to today for cough with minimal help rec Levaquin 500mg  daily for 7 days  Prednisone taper over next week.  Delsym 2 tsp Twice daily   For cough  Tramadol 50mg  1 every 4hr as needed for breakthrough cough.  Mucinex Twice daily  As needed  Congestion . 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 Follow-up    Cough has "pretty much disappeared".  She has not had to use her albuterol inhaler or neb recently.   Dyspnea:  Walking  Not as much but Not limited by breathing from desired activities   Cough: resolved/ off tramadol  Sleeping: fine flat bed with large pillow SABA use: occ before ex rec If cough flares, use tramadol 50 mg up to every 4 hours or up to 3 days and if not improving we need to see you right away    10/25/2018  f/u ov/Allison Gross  re: cough variant asthma  maint on symb 80 2bid  Chief Complaint  Patient presents with  . Follow-up    Cough still not a bother to her and her breathing is doing well. She has not been using her albuterol often.   Dyspnea:  Back walking agin  / playing golf ok / really Not limited by breathing from desired activities   Cough: resolved  Sleeping: flat with one pillow  SABA use: none rec If cough flares, use tramadol 50 mg up to every 4 hours or up to 3 days and if not improving we need to see you right away   06/03/2020  f/u ov/Allison Gross re: cough variant asthma  Chief Complaint  Patient presents with  . Follow-up    Shortness of breath when its cold out  Dyspnea:  Walking neighborhood when warm, hills a bit of problem but no change baseline   Cough: none  Sleeping:  Flat bed, one puffy pillow sleeps fine  SABA use: albuterol before ex sometimes if wait til pm to exercsie   02: none Covid status: vax x 3      No obvious day to day or daytime variability or assoc excess/ purulent sputum or mucus plugs or hemoptysis or cp or chest tightness, subjective wheeze or overt sinus or hb symptoms.   Sleeping  without nocturnal  or early am exacerbation  of respiratory  c/o's or need for noct saba. Also denies any obvious fluctuation of symptoms with weather or environmental changes or other aggravating or alleviating factors except as outlined above   No unusual exposure hx or h/o childhood pna/ asthma or knowledge of premature birth.  Current Allergies, Complete Past Medical History, Past Surgical History, Family History, and Social History were reviewed in [employer] record.  ROS  The following are not active complaints unless bolded Hoarseness, sore throat, dysphagia, dental problems, itching, sneezing,  nasal congestion or discharge of excess mucus or purulent secretions, ear ache,   fever, chills, sweats, unintended wt loss or wt gain, classically pleuritic or  exertional cp,  orthopnea pnd or arm/hand swelling  or leg swelling, presyncope, palpitations, abdominal pain from ventral hernia , anorexia, nausea, vomiting, diarrhea  or change in bowel habits or change in bladder habits, change in stools or change in urine, dysuria, hematuria,  rash, arthralgias, visual complaints, headache, numbness, weakness or ataxia or problems with walking or coordination,  change in mood or  memory.        Current Meds  Medication Sig  . budesonide (RHINOCORT AQUA) 32 MCG/ACT nasal spray USE TWO SPRAYS IN EACH NOSTRIL EVERY DAY.  . budesonide-formoterol (SYMBICORT) 80-4.5 MCG/ACT inhaler INHALE 2 PUFFS FIRST THING IN THE MORNING AND 2 PUFFS 12 HOURS LATER  . calcium-vitamin D (OSCAL WITH D) 500-200 MG-UNIT per tablet Take 1 tablet by mouth daily with breakfast.  . carbamazepine (CARBATROL) 200 MG 12 hr capsule Take 1 capsule (200 mg total) by mouth 2 (two) times daily.  . chlorpheniramine (CHLOR-TRIMETON) 4 MG tablet Take 4 mg by mouth every 4 (four) hours as needed (drippy nose, drainage, throat clearing).   . famotidine (PEPCID) 20 MG tablet Take 20 mg by mouth at bedtime as needed.  . fexofenadine (ALLEGRA) 180 MG tablet Take 180 mg by mouth every morning.  . Guaifenesin 1200 MG TB12 Take 1 tablet by mouth 2 (two) times daily as needed.  Marland Kitchen ibuprofen (ADVIL,MOTRIN) 400 MG tablet Take 400 mg by mouth every 8 (eight) hours as needed (joint pain).   Marland Kitchen ipratropium-albuterol (DUONEB) 0.5-2.5 (3) MG/3ML SOLN Take 3 mLs by nebulization every 4 (four) hours as needed (Wheezing/shortness of breath ((PLAN C))).  . Iron-Vitamin C 65-125 MG TABS Take 65 mg by mouth daily.  Marland Kitchen losartan-hydrochlorothiazide (HYZAAR) 50-12.5 MG per tablet Take 1 tablet by mouth every morning.   . melatonin 5 MG TABS Take 5 mg by mouth at bedtime. Takes 10mg  total HS  . Multiple Vitamin (MULTIVITAMIN) tablet Take 1 tablet by mouth every morning.  . pantoprazole (PROTONIX) 40 MG tablet TAKE 1 TABLET BY  MOUTH 30-60 MINUTES BEFORE FIRST MEAL OF THE DAY  . potassium chloride (K-DUR) 10 MEQ tablet Take 1 tablet by mouth daily. ((2 tabs on Mondays and Fridays))  . PROAIR HFA 108 (90 Base) MCG/ACT inhaler INHALE 2 PUFFS EVERY 4 HOURS AS NEEDED FOR WHEEZING AND SHORTNESS OF BREATH.  Marland Kitchen Respiratory Therapy Supplies (FLUTTER) DEVI As instructed  . Turmeric 500 MG CAPS Take 1 capsule by mouth every morning.   . vitamin C (ASCORBIC ACID) 500 MG tablet Take 500 mg by mouth every morning.   Current Facility-Administered Medications for the 06/03/20 encounter (Office Visit) with Nyoka Cowden, MD  Medication  . 0.9 %  sodium chloride infusion  Past Medical History:  Asthma  - HFA 50% November 10, 2009 > 75% December 24, 2009 > 90% March 26, 2010  Osteopenia  - hold fosfamax 09/2009 >> better with active HB so rec Reclast rx         Objective:  Physical Exam    10/25/2018  158  02/21/2018   155  07/14/2012   158 = baseline /   11/13/2014 162 > 02/14/2015   159 >  06/09/2015  157 > 11/13/2015  156 > 05/17/2016  161 > 08/02/2016  161 >  12/16/2016  158 > 06/13/2017  160 > 12/14/2017  95%     Vital signs reviewed  06/03/2020  - Note at rest 02 sats  97% on RA   General appearance:    Pleasant healthy wf  nad   HEENT : pt wearing mask not removed for exam due to covid -19 concerns.    NECK :  without JVD/Nodes/TM/ nl carotid upstrokes bilaterally   LUNGS: no acc muscle use,  Nl contour chest which is clear to A and P bilaterally without cough on insp or exp maneuvers   CV:  RRR  no s3 or murmur or increase in P2, and no edema   ABD: ventral hernia reported but not examined, abd min distended but soft   MS:  Nl gait/ ext warm without deformities, calf tenderness, cyanosis or clubbing No obvious joint restrictions   SKIN: warm and dry without lesions    NEURO:  alert, approp, nl sensorium with  no motor or cerebellar deficits apparent.           Assessment:

## 2020-07-03 ENCOUNTER — Other Ambulatory Visit: Payer: Self-pay | Admitting: Internal Medicine

## 2020-07-03 DIAGNOSIS — R058 Other specified cough: Secondary | ICD-10-CM

## 2020-07-03 NOTE — Telephone Encounter (Signed)
Patient was here last month for yearly follow up. Pantoprazole was only refilled for #30 with no refills. Ok to refill x 1year?  06/03/20 Please schedule a follow up visit in 12  months but call sooner if needed - ok to schedule in [place]       Instructions  No change in medications needed but take pepcid 20 mg in evening from now until the day of the surgery as well as symbcort  Please schedule a follow up visit in 12  months but call sooner if needed - ok to schedule in [place]

## 2020-07-09 ENCOUNTER — Encounter: Payer: Self-pay | Admitting: Neurology

## 2020-07-09 ENCOUNTER — Telehealth (INDEPENDENT_AMBULATORY_CARE_PROVIDER_SITE_OTHER): Payer: Medicare Other | Admitting: Neurology

## 2020-07-09 ENCOUNTER — Other Ambulatory Visit: Payer: Self-pay

## 2020-07-09 VITALS — Ht 59.5 in | Wt 155.0 lb

## 2020-07-09 DIAGNOSIS — G40009 Localization-related (focal) (partial) idiopathic epilepsy and epileptic syndromes with seizures of localized onset, not intractable, without status epilepticus: Secondary | ICD-10-CM

## 2020-07-09 DIAGNOSIS — G459 Transient cerebral ischemic attack, unspecified: Secondary | ICD-10-CM

## 2020-07-09 MED ORDER — CARBAMAZEPINE ER 200 MG PO CP12
200.0000 mg | ORAL_CAPSULE | Freq: Two times a day (BID) | ORAL | 3 refills | Status: DC
Start: 2020-07-09 — End: 2021-04-06

## 2020-07-09 NOTE — Patient Instructions (Signed)
1. Continue Carbatrol 200mg  twice a day, refills sent  2. Repeat MRI will be scheduled for October 2023 at [facility] to follow-up on right CP angle mass  3. Discuss aspirin and control of cholesterol with your cardiologist  4. Follow-up in 6-8 months, call for any changes

## 2020-07-09 NOTE — Progress Notes (Signed)
Telephone (Audio) Visit The purpose of this telephone visit is to provide medical care while limiting exposure to the novel coronavirus.    Consent was obtained for telephone visit:  Yes.   Answered questions that patient had about telehealth interaction:  Yes.   I discussed the limitations, risks, security and privacy concerns of performing an evaluation and management service by telephone. I also discussed with the patient that there may be a patient responsible charge related to this service. The patient expressed understanding and agreed to proceed.  Pt location: Home Physician Location: office Name of referring provider:  Nyoka Cowden, MD I connected with .Scot Dock at patients initiation/request on 07/09/2020 at 11:30 AM EDT by telephone and verified that I am speaking with the correct person using two identifiers.  Pt MRN:  [id number] Pt DOB:  Sep 15, 1945   History of Present Illness:  The patient had a telephone visit on 07/09/2020. Unable to connect via video due to technical difficulties. She was last seen in the neurology clinic 6 months ago after 2 transient episodes of left hand weakness and heaviness that lasted for a minute in August 2021. MRI brain with and without contrast and MRA head and neck done 01/2020 did not show any acute changes, there was mild diffuse atrophy and chronic microvascular disease. 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 There was less than 50% stenosis in bilateral proximal ICAs. Her LDL was 106, total cholesterol 233, HDL 98, triglycerides 146, HbA1c 5.4. She denies any further recurrence of left hand weakness/heaviness since August 2021. She had been advised to start a daily aspirin 81mg  tablet, however after reading that it should not be taken in patients over 70, she stopped it. She denies any seizures or seizure-like symptoms since 2000, no  staring/unresponsive episodes, gaps in time, olfactory/gustatory hallucinations, myoclonic jerks. No side effects on carbamazepine ER 200mg  BID. She has been having recurrent mild headaches over the right side which she thinks are more related to allergies/pollen burden. They come and go, are not excruciating, and when she takes an antihistamine, they resolve. She denies any dizziness, vision changes, no falls. She is active playing golf and exercising. She does not sleep well due to hot flashes and bladder issues, getting 3-4 hours at a time. She takes a brief nap in the afternoon.   History on Initial Assessment 12/31/2019: This is a pleasant 75 year old right-handed woman with a history of hypertension, complex partial seizures, presenting for second opinion. She had been seeing neurologist Dr. Maryln Gottron since at least 2013, records were reviewed. She was last seen in January 2021 with note of well-controlled seizures since on carbamazepine ER 200mg  BID. Her last carbamazepine level in January 2021 was 8.5. She reports having brief passing out episodes at age 1. She had preeclampsia with her first child in 87 and had post-partum convulsions. 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 It was not noticeable to others, she just held on to the table. She had 2 or 3 of them and was seen by Neurology and told there was a lesion on the left side. She was started on carbamazepine. She reports the last seizure was in 2000 and at that time dose was increased to carbamazepine ER 200mg  BID. She denies any side effects.  Her last bone density scan done 8 years ago showed osteopenia, she was briefly on Fosamax, which was stopped by another provider. She denies any staring/unresponsive episodes, gaps in time, olfactory/gustatory hallucinations, myoclonic jerks. She denies any headaches, dizziness, diplopia, dysarthria/dysphagia,  neck/back pain, bowel/bladder dysfunction. Her last fall was 2 months ago.   After she was referred and scheduled for this visit, she has also had 2 recent episodes that occurred a week apart at the end of August. The first episode occurred while she was holding the TV remote control with her left hand and she had weakness in her left hand and a heaviness in her left arm. It felt like it went out for a minute, no numbness/tingling/pain, then she felt fine. No associated headache, confusion. The second episode was at the end of August, she was typing on her computer then had the same sensation of weakness/heaviness in her left arm lasting 45-60 seconds. She also had one episode at night where there was a burning/itchiness on her left hand radiating to her fingers that lasted for 10 minutes. Face and leg were not affected. She reports having normal cholesterol levels when checked by her cardiologist, LDL in the 30s. Last bloodwork was over a year ago. She reports having an echocardiogram 4-6 weeks ago, reportedly normal.   Epilepsy Risk Factors:  She had a normal birth and early development.  There is no history of febrile convulsions, CNS infections such as meningitis/encephalitis, significant traumatic brain injury, neurosurgical procedures, or family history of seizures.    Observations/Objective:   Vitals:   07/09/20 1107  Weight: 155 lb (70.3 kg)  Height: 4' 11.5" (1.511 m)   Exam limited due to nature of phone visit. Patient is awake, alert, able to answer questions without confusion or dysarthria.   Assessment and Plan:   This is a pleasant 75 yo RH woman with a history of hypertension, complex partial seizures, seizure-free since 2000 on carbamazepine ER 200mg  BID, who had 2 brief episodes of left arm/hand weakness/heaviness, last episode in August 2021. MRI brain/MRA head and neck did not show any acute changes, no significant occlusion/stenosis. 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 She denies any further episodes of left hand/arm weakness/heaviness since August 2021, possibly TIA although quite brief. We discussed aspirin as primary prevention is not recommended, however if patients have had TIA/stroke-like symptoms, daily aspirin 81mg  is recommended. She will discuss this with her cardiologist, as well as control of vascular risk factors, goal LDL <70, BP control. Continue carbamazepine 200mg  BID, refills sent. She will have a follow-up MRI for the CP angle mass in October 2021, recommend hearing evaluation. Follow-up in 6-8 months, she knows to call for any changes.    Follow Up Instructions:   -I discussed the assessment and treatment plan with the patient. The patient was provided an opportunity to ask questions and all were answered. The patient agreed with the plan and demonstrated an understanding of the instructions.   The patient was advised to call back or seek an in-person evaluation if the symptoms worsen or if the condition fails to improve as anticipated.    Total Time spent in visit with the patient was:  15 minutes, of which 100% of the time was spent in counseling and/or coordinating care on the above.   Pt understands and agrees with the plan of care outlined.     Van Clines, MD  CC:  Dr. Earna Coder [facility] in [place], [place]), Dr. Sherene Sires

## 2020-07-15 ENCOUNTER — Other Ambulatory Visit: Payer: Self-pay | Admitting: Internal Medicine

## 2020-08-08 ENCOUNTER — Other Ambulatory Visit: Payer: Self-pay | Admitting: Internal Medicine

## 2020-09-18 ENCOUNTER — Telehealth: Payer: Self-pay | Admitting: Internal Medicine

## 2020-09-18 MED ORDER — PREDNISONE 10 MG PO TABS: ORAL_TABLET | ORAL | 0 refills | Status: DC

## 2020-09-18 MED ORDER — BENZONATATE 200 MG PO CAPS: 200.0000 mg | ORAL_CAPSULE | Freq: Three times a day (TID) | ORAL | 0 refills | Status: DC | PRN

## 2020-09-18 NOTE — Telephone Encounter (Signed)
Try Prednisone 10 mg take  4 each am x 2 days,   2 each am x 2 days,  1 each am x 2 days and stop and tessalon 200 mg q 8 h prn # 45 and if not better by early next week will need ov

## 2020-09-18 NOTE — Telephone Encounter (Signed)
Called and spoke with Patient.  Dr. Gustavus Bryant recommendations given.  Understanding stated.  Prescriptions sent to requested [facility].  Nothing further at this time.

## 2020-09-18 NOTE — Telephone Encounter (Signed)
Spoke with the pt  She is states her cough started back after being gone for about 2 years on 09/13/20  She recently traveled to [place] by car  She is coughing up clear sputum "constantly have cough"  Also wheezing- using her symbicort 2 x daily and albuterol inhaler 2-3 x per day  She is also on her pepcid at bedtime and uses chlortabs throughout the day  She states no f/c/s, aches, SOB, chest tightness, sore throat, HA She has been vaccinated against covid x 4  She has not taken a covid test  Please advise, thanks!  Allergies  Allergen Reactions  . Sulfonamide Derivatives Nausea And Vomiting

## 2020-09-19 ENCOUNTER — Telehealth: Payer: Self-pay | Admitting: Internal Medicine

## 2020-09-19 MED ORDER — IPRATROPIUM-ALBUTEROL 0.5-2.5 (3) MG/3ML IN SOLN: 3.0000 mL | RESPIRATORY_TRACT | 2 refills | Status: DC | PRN

## 2020-09-19 NOTE — Telephone Encounter (Signed)
Spoke with the pt  Her duoneb is expired  I advised that she should not be using expired meds, not going to be effective  I have sent new rx for her Duoneb  Nothing further needed

## 2020-09-22 ENCOUNTER — Telehealth: Payer: Self-pay | Admitting: Internal Medicine

## 2020-09-22 NOTE — Telephone Encounter (Signed)
Routing to [place] as an FYI since she is the one who pt has the appt with tomorrow 6/7 and also routing to Dr. Melvyn Novas as an Juluis Rainier.

## 2020-09-22 NOTE — Telephone Encounter (Signed)
Called and spoke with patient, advised of recommendations per MW.  She states she will get a home test and call if it is positive so we can change the OV to a telephone visit.  She verbalized understanding.  She is currently scheduled to see Beth at 3pm on 09/23/20, advised to arrive by 2:45 pm.  She states she had a prescription from Dr. Melvyn Novas for Tramadol and that is the only thing that helps with the cough, however, she never had it filled and it expired.  She was asking for a prescription.  I advised her to have the covid test and then this is something we can address during the OV.  Will leave open in case she calls back with a + covid test.

## 2020-09-22 NOTE — Telephone Encounter (Signed)
Pt calling to inform that her COVID test was negative, Nothing further needed.

## 2020-09-22 NOTE — Telephone Encounter (Signed)
Should do a self test first then come in asap   If self test positive change to televisit

## 2020-09-22 NOTE — Telephone Encounter (Signed)
Called and spoke with pt who states that she is still having problems with her cough. States cough began 1 week ago after she had just gotten back home after being in [place].  Pt called office 6/2 due to complaints with her cough. States she is taking the tessalon which is not helping with her cough at all. Pt is also still on the prednisone which was prescribed.  Pt does not have any complaints of fever.  Pt is having to use her rescue inhaler 4-5 times daily and is using her nebulizer every 4 hours.  Asked pt if she had taken a covid test to make sure that the results came back negative and she said that she has not. Pt stated that she has had 3 of her covid vaccines (do not have any record of it in her chart).  Stated to pt since she is still having problems after just being prescribed meds that we should probably schedule her an appt.  Dr. Melvyn Novas, please advise if you think that pt would be okay to come into the office for an acute visit with an APP as pt wants to have appt today, or if visit should be a televisit.

## 2020-09-23 ENCOUNTER — Encounter: Payer: Self-pay | Admitting: Primary Care

## 2020-09-23 ENCOUNTER — Other Ambulatory Visit: Payer: Self-pay

## 2020-09-23 ENCOUNTER — Ambulatory Visit (INDEPENDENT_AMBULATORY_CARE_PROVIDER_SITE_OTHER): Payer: Medicare Other | Admitting: Primary Care

## 2020-09-23 ENCOUNTER — Ambulatory Visit (INDEPENDENT_AMBULATORY_CARE_PROVIDER_SITE_OTHER): Payer: Medicare Other

## 2020-09-23 VITALS — BP 116/64 | HR 107 | Temp 98.2°F | Ht 60.0 in | Wt 155.8 lb

## 2020-09-23 DIAGNOSIS — J45901 Unspecified asthma with (acute) exacerbation: Secondary | ICD-10-CM

## 2020-09-23 DIAGNOSIS — R059 Cough, unspecified: Secondary | ICD-10-CM

## 2020-09-23 MED ORDER — METHYLPREDNISOLONE ACETATE 80 MG/ML IJ SUSP
80.0000 mg | Freq: Once | INTRAMUSCULAR | Status: AC
  Administered 2020-09-23: 80 mg via INTRAMUSCULAR

## 2020-09-23 MED ORDER — DOXYCYCLINE HYCLATE 100 MG PO TABS: 100.0000 mg | ORAL_TABLET | Freq: Two times a day (BID) | ORAL | 0 refills | Status: DC

## 2020-09-23 MED ORDER — PREDNISONE 10 MG PO TABS: ORAL_TABLET | ORAL | 0 refills | Status: DC

## 2020-09-23 NOTE — Progress Notes (Signed)
@Patient  ID: Allison Gross, female    DOB: June 13, 1945, 75 y.o.   MRN: [id number]  Chief Complaint  Patient presents with   Follow-up    Cough for 1 week    Referring provider: Nyoka Cowden, MD  HPI: 75 year old female, former light smoker quit in 1986 (5 pack year hx). PMH significant for asthma, chronic rhinitis, HTN, osteoporosis. Patient of Dr. Sherene Sires.   09/23/2020 Patient presents today for acute visit. Reports symptoms of cough fits with wheezing. She is getting up clear mucus. Symptoms started 1 week ago after traveling to [place]. This is the first time in 4 years she has had an asthma exacerbation. Continues to use Symbicort two puffs twice daily as prescribed, currently having to use her Albuterol nebulizer every 4 hours. She started prednisone Friday June 3rd, reports insomnia with steriods. She is taking chlorpheniramine tablets q 4 hours. Tessalon has not been helpful with her cough.   Allergies  Allergen Reactions   Sulfonamide Derivatives Nausea And Vomiting    Immunization History  Administered Date(s) Administered   Influenza Split 02/10/2011, 01/18/2015, 01/18/2016   Influenza Whole 01/18/2012, 01/27/2012   Influenza, High Dose Seasonal PF 02/10/2018   Influenza,inj,Quad PF,6+ Mos 01/03/2013   Influenza-Unspecified 01/17/2014, 01/17/2017   PFIZER(Purple Top)SARS-COV-2 Vaccination 05/05/2019, 05/26/2019, 01/11/2020, 08/26/2020   Pneumococcal Polysaccharide-23 01/18/2007    Past Medical History:  Diagnosis Date   Asthma    Cancer (HCC)    basil,squamous   Hypertension    Osteopenia    Seizures (HCC) 15 years ago   1982's from childbirth    Tobacco History: Social History   Tobacco Use  Smoking Status Former   Packs/day: 0.10   Years: 5.00   Pack years: 0.50   Types: Cigarettes   Quit date: 04/19/1984   Years since quitting: 36.4  Smokeless Tobacco Never  Tobacco Comments   reports smoked socially   Counseling given: Not  Answered Tobacco comments: reports smoked socially   Outpatient Medications Prior to Visit  Medication Sig Dispense Refill   albuterol (PROAIR HFA) 108 (90 Base) MCG/ACT inhaler INHALE 2 PUFFS EVERY 4 HOURS AS NEEDED FOR WHEEZING AND SHORTNESS OF BREATH. 8.5 g 10   benzonatate (TESSALON) 200 MG capsule Take 1 capsule (200 mg total) by mouth every 8 (eight) hours as needed for cough. 45 capsule 0   budesonide (RHINOCORT AQUA) 32 MCG/ACT nasal spray USE TWO SPRAYS IN EACH NOSTRIL EVERY DAY. 8.43 mL 0   budesonide-formoterol (SYMBICORT) 80-4.5 MCG/ACT inhaler INHALE 2 PUFFS FIRST THING IN THE MORNING AND 2 PUFFS 12 HOURS LATER 10.2 g 5   calcium-vitamin D (OSCAL WITH D) 500-200 MG-UNIT per tablet Take 1 tablet by mouth daily with breakfast.     carbamazepine (CARBATROL) 200 MG 12 hr capsule Take 1 capsule (200 mg total) by mouth 2 (two) times daily. 180 capsule 3   chlorpheniramine (CHLOR-TRIMETON) 4 MG tablet Take 4 mg by mouth every 4 (four) hours as needed (drippy nose, drainage, throat clearing).      famotidine (PEPCID) 20 MG tablet Take 20 mg by mouth at bedtime as needed.     fexofenadine (ALLEGRA) 180 MG tablet Take 180 mg by mouth every morning.     Guaifenesin 1200 MG TB12 Take 1 tablet by mouth 2 (two) times daily as needed.     ibuprofen (ADVIL,MOTRIN) 400 MG tablet Take 400 mg by mouth every 8 (eight) hours as needed (joint pain).      ipratropium-albuterol (DUONEB) 0.5-2.5 (  3) MG/3ML SOLN Take 3 mLs by nebulization every 4 (four) hours as needed (Wheezing/shortness of breath ((PLAN C))). 360 mL 2   losartan-hydrochlorothiazide (HYZAAR) 50-12.5 MG per tablet Take 1 tablet by mouth every morning.      melatonin 5 MG TABS Take 5 mg by mouth at bedtime. Takes 10mg  total HS     Multiple Vitamin (MULTIVITAMIN) tablet Take 1 tablet by mouth every morning.     pantoprazole (PROTONIX) 40 MG tablet TAKE 1 TABLET BY MOUTH 30-60 MINUTES BEFORE FIRST MEAL OF THE DAY 30 tablet 11   potassium  chloride (K-DUR) 10 MEQ tablet Take 1 tablet by mouth daily. ((2 tabs on Mondays and Fridays))     Respiratory Therapy Supplies (FLUTTER) DEVI As instructed 1 each 0   Turmeric 500 MG CAPS Take 1 capsule by mouth every morning.      vitamin C (ASCORBIC ACID) 500 MG tablet Take 500 mg by mouth every morning.     ipratropium-albuterol (DUONEB) 0.5-2.5 (3) MG/3ML SOLN Take 3 mLs by nebulization every 4 (four) hours as needed. 120 mL 2   predniSONE (DELTASONE) 10 MG tablet Take 4 tabs (40mg )x2days,2tabs(20mg )x2days,1 tab (10mg )x2 days then stop 16 tablet 0   Facility-Administered Medications Prior to Visit  Medication Dose Route Frequency Provider Last Rate Last Admin   0.9 %  sodium chloride infusion  500 mL Intravenous Continuous Meryl Dare, MD        Review of Systems  Review of Systems  Constitutional: Negative.   HENT:  Positive for sore throat.   Respiratory:  Positive for cough, shortness of breath and wheezing.    Physical Exam  BP 116/64 (BP Location: Left Arm, Cuff Size: Normal)   Pulse (!) 107   Temp 98.2 F (36.8 C) (Temporal)   Ht 5' (1.524 m)   Wt 155 lb 12.8 oz (70.7 kg)   SpO2 96% Comment: RA  BMI 30.43 kg/m  Physical Exam Constitutional:      Appearance: Normal appearance.  HENT:     Head: Normocephalic and atraumatic.     Mouth/Throat:     Mouth: Mucous membranes are moist.     Pharynx: Oropharynx is clear. Posterior oropharyngeal erythema present. No oropharyngeal exudate.  Cardiovascular:     Rate and Rhythm: Normal rate and regular rhythm.  Pulmonary:     Effort: Pulmonary effort is normal.     Breath sounds: Wheezing and rhonchi present.  Neurological:     General: No focal deficit present.     Mental Status: She is alert and oriented to person, place, and time. Mental status is at baseline.  Psychiatric:        Mood and Affect: Mood normal.        Behavior: Behavior normal.        Thought Content: Thought content normal.        Judgment:  Judgment normal.     Lab Results:  CBC    Component Value Date/Time   WBC 5.3 06/13/2017 1108   RBC 4.44 06/13/2017 1108   HGB 10.6 (L) 06/13/2017 1108   HCT 32.8 (L) 06/13/2017 1108   PLT 336.0 06/13/2017 1108   MCV 73.8 (L) 06/13/2017 1108   MCHC 32.4 06/13/2017 1108   RDW 15.9 (H) 06/13/2017 1108   LYMPHSABS 1.3 06/13/2017 1108   MONOABS 0.5 06/13/2017 1108   EOSABS 0.1 06/13/2017 1108   BASOSABS 0.1 06/13/2017 1108    BMET No results found for: NA, K, CL, CO2, GLUCOSE, BUN,  CREATININE, CALCIUM, GFRNONAA, GFRAA  BNP No results found for: BNP  ProBNP    Component Value Date/Time   PROBNP 69.0 06/13/2017 1108    Imaging: DG Chest 2 View  Result Date: 09/23/2020 CLINICAL DATA:  Asthma exacerbation, cough EXAM: CHEST - 2 VIEW COMPARISON:  08/02/2016 FINDINGS: Upper normal heart size. Moderate-sized hiatal hernia. Atherosclerotic calcification aorta. Mediastinal contours and pulmonary vascularity otherwise normal. Linear subsegmental atelectasis LEFT base, slightly increased. Remaining lungs clear. No definite acute infiltrate, pleural effusion, or pneumothorax. No acute osseous findings. IMPRESSION: Increased subsegmental atelectasis at LEFT base. Moderate-sized hiatal hernia. Aortic Atherosclerosis (ICD10-I70.0). Electronically Signed   By: Ulyses Southward M.D.   On: 09/23/2020 15:35     Assessment & Plan:   Acute asthma exacerbation - Patient has been having coughing fit with associated wheezing for the last week. On exam, she has wheezing and rhonchi t/o both lungs. She is reluctant to take oral steriods d/t insomnia.  - Received IV depo-medrol 80mg  X1 today in office  - Continue Symbicort two puffs twice daily and prn albuterol q 6 hours for breakthrough sob/wheezing - Checking CXR today  - Sending in Rx for doxycyline 100mg  BID x 7 days and prednisone taper (20mg  x 3 days; 10mg  x 3 days 5mg  x 3 days) - FU in 1 week to ensure improvement in clinical symptoms       Glenford Bayley, NP 10/02/2020

## 2020-09-23 NOTE — Patient Instructions (Addendum)
Recommendations: Continue Symbicort 42mcg two puffs morning and evening Use Duoneb 4 times a day until better   Take mucinex 1200mg  in morning   In office treatment: 80mg  IM depo-medrol  Orders: CXR today   Rx: Prednisone 20mg  x 3 days; 10mg  x 3 days; 5mg  x 3 days   Follow-up: 1 week follow-up with Eustaquio Maize NP

## 2020-10-02 ENCOUNTER — Other Ambulatory Visit: Payer: Self-pay

## 2020-10-02 ENCOUNTER — Encounter: Payer: Self-pay | Admitting: Primary Care

## 2020-10-02 ENCOUNTER — Ambulatory Visit (INDEPENDENT_AMBULATORY_CARE_PROVIDER_SITE_OTHER): Payer: Medicare Other | Admitting: Primary Care

## 2020-10-02 DIAGNOSIS — J45909 Unspecified asthma, uncomplicated: Secondary | ICD-10-CM

## 2020-10-02 NOTE — Progress Notes (Signed)
@Patient  ID: Allison Gross, female    DOB: 08/23/1945, 75 y.o.   MRN: [id number]  Chief Complaint  Patient presents with   Follow-up    Pt states she is better than she was at last visit.    Referring provider: Nyoka Cowden, MD  HPI: 75 year old female, former light smoker quit in 1986 (5 pack year hx). PMH significant for asthma, chronic rhinitis, HTN, osteoporosis. Patient of Dr. Sherene Sires.   Previous [facility] pulmonary encounter:   09/23/2020 Patient presents today for acute visit. Reports symptoms of cough fits with wheezing. She is getting up clear mucus. Symptoms started 1 week ago after traveling to [place]. This is the first time in 4 years she has had an asthma exacerbation. Continues to use Symbicort two puffs twice daily as prescribed, currently having to use her Albuterol nebulizer every 4 hours. She started prednisone Friday June 3rd, reports insomnia with steriods. She is taking chlorpheniramine tablets q 4 hours. Tessalon has not been helpful with her cough.   10/02/2020- Interim hx  Patient presents today for  1 week follow-up. She is feeling a lot better. Wheezing has resolved, she still has an occasional cough. CXR on 09/23/20 showed subsegmental atelectasis left base and moderate hiatal hernia. She completed doxycycline course but medication did make her nauseous while taking it. No other complaints.    Allergies  Allergen Reactions   Sulfonamide Derivatives Nausea And Vomiting    Immunization History  Administered Date(s) Administered   Influenza Split 02/10/2011, 01/18/2015, 01/18/2016   Influenza Whole 01/18/2012, 01/27/2012   Influenza, High Dose Seasonal PF 02/10/2018   Influenza,inj,Quad PF,6+ Mos 01/03/2013   Influenza-Unspecified 01/17/2014, 01/17/2017   PFIZER(Purple Top)SARS-COV-2 Vaccination 05/05/2019, 05/26/2019, 01/11/2020, 08/26/2020   Pneumococcal Polysaccharide-23 01/18/2007    Past Medical History:  Diagnosis Date   Asthma    Cancer (HCC)     basil,squamous   Hypertension    Osteopenia    Seizures (HCC) 15 years ago   1982's from childbirth    Tobacco History: Social History   Tobacco Use  Smoking Status Former   Packs/day: 0.10   Years: 5.00   Pack years: 0.50   Types: Cigarettes   Quit date: 04/19/1984   Years since quitting: 36.4  Smokeless Tobacco Never  Tobacco Comments   reports smoked socially   Counseling given: Not Answered Tobacco comments: reports smoked socially   Outpatient Medications Prior to Visit  Medication Sig Dispense Refill   albuterol (PROAIR HFA) 108 (90 Base) MCG/ACT inhaler INHALE 2 PUFFS EVERY 4 HOURS AS NEEDED FOR WHEEZING AND SHORTNESS OF BREATH. 8.5 g 10   benzonatate (TESSALON) 200 MG capsule Take 1 capsule (200 mg total) by mouth every 8 (eight) hours as needed for cough. 45 capsule 0   budesonide (RHINOCORT AQUA) 32 MCG/ACT nasal spray USE TWO SPRAYS IN EACH NOSTRIL EVERY DAY. 8.43 mL 0   budesonide-formoterol (SYMBICORT) 80-4.5 MCG/ACT inhaler INHALE 2 PUFFS FIRST THING IN THE MORNING AND 2 PUFFS 12 HOURS LATER 10.2 g 5   calcium-vitamin D (OSCAL WITH D) 500-200 MG-UNIT per tablet Take 1 tablet by mouth daily with breakfast.     carbamazepine (CARBATROL) 200 MG 12 hr capsule Take 1 capsule (200 mg total) by mouth 2 (two) times daily. 180 capsule 3   chlorpheniramine (CHLOR-TRIMETON) 4 MG tablet Take 4 mg by mouth every 4 (four) hours as needed (drippy nose, drainage, throat clearing).      famotidine (PEPCID) 20 MG tablet Take 20 mg  by mouth at bedtime as needed.     fexofenadine (ALLEGRA) 180 MG tablet Take 180 mg by mouth every morning.     Guaifenesin 1200 MG TB12 Take 1 tablet by mouth 2 (two) times daily as needed.     ibuprofen (ADVIL,MOTRIN) 400 MG tablet Take 400 mg by mouth every 8 (eight) hours as needed (joint pain).      ipratropium-albuterol (DUONEB) 0.5-2.5 (3) MG/3ML SOLN Take 3 mLs by nebulization every 4 (four) hours as needed (Wheezing/shortness of breath ((PLAN  C))). 360 mL 2   losartan-hydrochlorothiazide (HYZAAR) 50-12.5 MG per tablet Take 1 tablet by mouth every morning.      melatonin 5 MG TABS Take 5 mg by mouth at bedtime. Takes 10mg  total HS     Multiple Vitamin (MULTIVITAMIN) tablet Take 1 tablet by mouth every morning.     pantoprazole (PROTONIX) 40 MG tablet TAKE 1 TABLET BY MOUTH 30-60 MINUTES BEFORE FIRST MEAL OF THE DAY 30 tablet 11   potassium chloride (K-DUR) 10 MEQ tablet Take 1 tablet by mouth daily. ((2 tabs on Mondays and Fridays))     Respiratory Therapy Supplies (FLUTTER) DEVI As instructed 1 each 0   Turmeric 500 MG CAPS Take 1 capsule by mouth every morning.      vitamin C (ASCORBIC ACID) 500 MG tablet Take 500 mg by mouth every morning.     doxycycline (VIBRA-TABS) 100 MG tablet Take 1 tablet (100 mg total) by mouth 2 (two) times daily. 14 tablet 0   ipratropium-albuterol (DUONEB) 0.5-2.5 (3) MG/3ML SOLN Take 3 mLs by nebulization every 4 (four) hours as needed. 120 mL 2   predniSONE (DELTASONE) 10 MG tablet Take 20mg  x 3 days; 10mg  x 3 days; 5mg  x 3 days 11 tablet 0   Facility-Administered Medications Prior to Visit  Medication Dose Route Frequency Provider Last Rate Last Admin   0.9 %  sodium chloride infusion  500 mL Intravenous Continuous Meryl Dare, MD          Review of Systems  Review of Systems  Constitutional: Negative.   HENT: Negative.    Respiratory:  Positive for cough. Negative for chest tightness, shortness of breath and wheezing.        Rare cough    Physical Exam  BP 120/70 (BP Location: Right Arm, Patient Position: Sitting, Cuff Size: Normal)   Pulse 78   Temp (!) 97.4 F (36.3 C) (Temporal)   Ht 5' (1.524 m)   Wt 153 lb (69.4 kg)   SpO2 97% Comment: RA  BMI 29.88 kg/m  Physical Exam Constitutional:      Appearance: Normal appearance.  HENT:     Head: Normocephalic and atraumatic.     Mouth/Throat:     Comments: Deferred d/t masking Cardiovascular:     Rate and Rhythm: Normal  rate and regular rhythm.  Pulmonary:     Effort: Pulmonary effort is normal.     Breath sounds: Normal breath sounds.     Comments: CTA Musculoskeletal:        General: Normal range of motion.  Skin:    General: Skin is warm and dry.  Neurological:     General: No focal deficit present.     Mental Status: She is alert and oriented to person, place, and time. Mental status is at baseline.  Psychiatric:        Mood and Affect: Mood normal.        Behavior: Behavior normal.  Thought Content: Thought content normal.        Judgment: Judgment normal.     Lab Results:  CBC    Component Value Date/Time   WBC 5.3 06/13/2017 1108   RBC 4.44 06/13/2017 1108   HGB 10.6 (L) 06/13/2017 1108   HCT 32.8 (L) 06/13/2017 1108   PLT 336.0 06/13/2017 1108   MCV 73.8 (L) 06/13/2017 1108   MCHC 32.4 06/13/2017 1108   RDW 15.9 (H) 06/13/2017 1108   LYMPHSABS 1.3 06/13/2017 1108   MONOABS 0.5 06/13/2017 1108   EOSABS 0.1 06/13/2017 1108   BASOSABS 0.1 06/13/2017 1108    BMET No results found for: NA, K, CL, CO2, GLUCOSE, BUN, CREATININE, CALCIUM, GFRNONAA, GFRAA  BNP No results found for: BNP  ProBNP    Component Value Date/Time   PROBNP 69.0 06/13/2017 1108    Imaging: DG Chest 2 View  Result Date: 09/23/2020 CLINICAL DATA:  Asthma exacerbation, cough EXAM: CHEST - 2 VIEW COMPARISON:  08/02/2016 FINDINGS: Upper normal heart size. Moderate-sized hiatal hernia. Atherosclerotic calcification aorta. Mediastinal contours and pulmonary vascularity otherwise normal. Linear subsegmental atelectasis LEFT base, slightly increased. Remaining lungs clear. No definite acute infiltrate, pleural effusion, or pneumothorax. No acute osseous findings. IMPRESSION: Increased subsegmental atelectasis at LEFT base. Moderate-sized hiatal hernia. Aortic Atherosclerosis (ICD10-I70.0). Electronically Signed   By: Ulyses Southward M.D.   On: 09/23/2020 15:35     Assessment & Plan:   Asthma - Treated for  acute exacerbation of her asthma last week, her symptoms have resolved after completing Doxcycline and prednisone taper. No changes today, continue Symbicort 2 puffs BID. FU in 6 months with Dr. Sherene Sires or sooner if needed.       Glenford Bayley, NP 10/02/2020

## 2020-10-02 NOTE — Assessment & Plan Note (Addendum)
-   Patient has been having coughing fit with associated wheezing for the last week. On exam, she has wheezing and rhonchi t/o both lungs. She is reluctant to take oral steriods d/t insomnia.  - Received IV depo-medrol 80mg  X1 today in office  - Continue Symbicort 36mcg two puffs twice daily. Recommend she use duoneb QID until symptoms better  - Checking CXR today  - Sending in Rx for doxycyline 100mg  BID x 7 days and prednisone taper (20mg  x 3 days; 10mg  x 3 days 5mg  x 3 days) - FU in 1 week to ensure improvement in clinical symptoms

## 2020-10-02 NOTE — Assessment & Plan Note (Addendum)
-   Treated for acute exacerbation of her asthma last week, her symptoms have resolved after completing Doxcycline and prednisone taper. No changes today, continue Symbicort 34mcg 2 puffs BID. FU in 6 months with Dr. Melvyn Novas or sooner if needed.

## 2020-10-02 NOTE — Patient Instructions (Signed)
Nice seeing you today Allison Gross, I am glad you are doing better No changes today Continue Symbicort 80mg c two puffs twice daily   Follow-up: 6 months with Dr. Melvyn Novas or sooner if needed

## 2021-03-25 ENCOUNTER — Other Ambulatory Visit: Payer: Self-pay | Admitting: Internal Medicine

## 2021-03-31 ENCOUNTER — Telehealth: Payer: Self-pay | Admitting: Internal Medicine

## 2021-03-31 MED ORDER — CEFDINIR 300 MG PO CAPS: 300.0000 mg | ORAL_CAPSULE | Freq: Two times a day (BID) | ORAL | 0 refills | Status: DC

## 2021-03-31 MED ORDER — PREDNISONE 10 MG PO TABS: ORAL_TABLET | ORAL | 0 refills | Status: DC

## 2021-03-31 NOTE — Telephone Encounter (Signed)
Sick x 3-4 d same as husband's illness a week prior with neg covid/flu testing for him.  Rec  Omnicef 300 mg bid x 7d,  Prednisone 10 mg take  4 each am x 2 days,   2 each am x 2 days,  1 each am x 2 days and stop and f/u prn

## 2021-03-31 NOTE — Telephone Encounter (Signed)
Spoke with pt who states she has had a productive cough (thick, green) for 3 - 4 days. She denies fever/ chills/ GI upset. Pt states taking Symbicort as directed and having her rescue inhaler if need. Pt states she is currently taking Tessalon pearls and Mucinex which are not helping. Pt also states her husband was diagnosed with Bronchitis. Dr. Melvyn Novas please advise.

## 2021-04-03 ENCOUNTER — Telehealth: Payer: Self-pay | Admitting: Internal Medicine

## 2021-04-03 MED ORDER — PROMETHAZINE-DM 6.25-15 MG/5ML PO SYRP: 5.0000 mL | ORAL_SOLUTION | ORAL | 0 refills | Status: DC

## 2021-04-03 NOTE — Telephone Encounter (Signed)
The patient called back and I gave her the recommendations per Dr. Melvyn Novas and she voices understanding. Nothing further needed. I have sent in the medication per Dr. Melvyn Novas recommendation as well.

## 2021-04-03 NOTE — Telephone Encounter (Signed)
Try phenergan dm  2 tsp every 4 h  prn x 6 oz  if can't get her in today to see NP there.

## 2021-04-03 NOTE — Addendum Note (Signed)
Addended by: Dessie Coma on: 04/03/2021 11:17 AM   Modules accepted: Orders

## 2021-04-03 NOTE — Telephone Encounter (Signed)
I called the patient and left a message to call back.  ? ?

## 2021-04-03 NOTE — Telephone Encounter (Signed)
Primary Pulmonologist: [facility] Last office visit and with whom: 10/02/2020 Derl Barrow What do we see them for (pulmonary problems): Asthma Last OV assessment/plan:    Assessment & Plan Note by Martyn Ehrich, NP at 10/02/2020 1:33 PM  Author: Martyn Ehrich, NP Author Type: Nurse Practitioner Filed: 10/02/2020  1:35 PM  Note Status: Bernell List: Cosign Not Required Encounter Date: 10/02/2020  Problem: Asthma  Editor: Martyn Ehrich, NP (Nurse Practitioner)      Prior Versions: 1. Martyn Ehrich, NP (Nurse Practitioner) at 10/02/2020  1:35 PM - Edited   2. Martyn Ehrich, NP (Nurse Practitioner) at 10/02/2020  1:34 PM - Written    - Treated for acute exacerbation of her asthma last week, her symptoms have resolved after completing Doxcycline and prednisone taper. No changes today, continue Symbicort 46mcg 2 puffs BID. FU in 6 months with Dr. Melvyn Novas or sooner if needed.          Patient Instructions by Martyn Ehrich, NP at 10/02/2020 12:21 PM  Author: Martyn Ehrich, NP Author Type: Nurse Practitioner Filed: 10/02/2020 12:21 PM  Note Status: Signed Cosign: Cosign Not Required Encounter Date: 10/02/2020  Editor: Martyn Ehrich, NP (Nurse Practitioner)               Nice seeing you today Ms Brimley, I am glad you are doing better No changes today Continue Symbicort 80mg c two puffs twice daily    Follow-up: 6 months with Dr. Melvyn Novas or sooner if needed        Orthostatic Vitals Recorded in This Encounter   10/02/2020  1201     Patient Position: Sitting  BP Location: Right Arm  Cuff Size: Normal   Instructions    Return in about 6 months (around 04/03/2021).  Nice seeing you today Ms Killian, I am glad you are doing better No changes today Continue Symbicort 80mg c two puffs twice daily    Follow-up: 6 months with Dr. Melvyn Novas or sooner if needed       Was appointment offered to patient (explain)?  No, on antibiotic and prednisone.  Just wanting something  else for cough.   Reason for call: Tessalon tablets not helping since Tuesday.  Still using the nebulizer every 4 hours, antibiotic and prednisone as well as musinex.  She says the Tessalon never works for her and she hs not slept in 4 days.  (examples of things to ask: : When did symptoms start? Fever? Cough? Productive? Color to sputum? More sputum than usual? Wheezing? Have you needed increased oxygen? Are you taking your respiratory medications? What over the counter measures have you tried?)  Allergies  Allergen Reactions   Sulfonamide Derivatives Nausea And Vomiting    Immunization History  Administered Date(s) Administered   Influenza Split 02/10/2011, 01/18/2015, 01/18/2016   Influenza Whole 01/18/2012, 01/27/2012   Influenza, High Dose Seasonal PF 02/10/2018   Influenza,inj,Quad PF,6+ Mos 01/03/2013   Influenza-Unspecified 01/17/2014, 01/17/2017   PFIZER(Purple Top)SARS-COV-2 Vaccination 05/05/2019, 05/26/2019, 01/11/2020, 08/26/2020   Pneumococcal Polysaccharide-23 01/18/2007

## 2021-04-06 ENCOUNTER — Ambulatory Visit (INDEPENDENT_AMBULATORY_CARE_PROVIDER_SITE_OTHER): Payer: Medicare Other

## 2021-04-06 ENCOUNTER — Telehealth (INDEPENDENT_AMBULATORY_CARE_PROVIDER_SITE_OTHER): Payer: Medicare Other | Admitting: Neurology

## 2021-04-06 ENCOUNTER — Ambulatory Visit (INDEPENDENT_AMBULATORY_CARE_PROVIDER_SITE_OTHER): Payer: Medicare Other | Admitting: Adult Health

## 2021-04-06 ENCOUNTER — Other Ambulatory Visit: Payer: Self-pay

## 2021-04-06 ENCOUNTER — Telehealth: Payer: Self-pay

## 2021-04-06 ENCOUNTER — Telehealth: Payer: Self-pay | Admitting: Adult Health

## 2021-04-06 ENCOUNTER — Encounter: Payer: Self-pay | Admitting: Neurology

## 2021-04-06 ENCOUNTER — Encounter: Payer: Self-pay | Admitting: Adult Health

## 2021-04-06 VITALS — BP 140/80 | HR 52 | Temp 98.4°F | Ht 60.0 in | Wt 157.0 lb

## 2021-04-06 VITALS — Ht 60.0 in | Wt 155.0 lb

## 2021-04-06 DIAGNOSIS — J4541 Moderate persistent asthma with (acute) exacerbation: Secondary | ICD-10-CM

## 2021-04-06 DIAGNOSIS — J45901 Unspecified asthma with (acute) exacerbation: Secondary | ICD-10-CM | POA: Diagnosis not present

## 2021-04-06 DIAGNOSIS — G40009 Localization-related (focal) (partial) idiopathic epilepsy and epileptic syndromes with seizures of localized onset, not intractable, without status epilepticus: Secondary | ICD-10-CM

## 2021-04-06 DIAGNOSIS — J310 Chronic rhinitis: Secondary | ICD-10-CM

## 2021-04-06 DIAGNOSIS — G459 Transient cerebral ischemic attack, unspecified: Secondary | ICD-10-CM

## 2021-04-06 DIAGNOSIS — D333 Benign neoplasm of cranial nerves: Secondary | ICD-10-CM | POA: Diagnosis not present

## 2021-04-06 MED ORDER — BENZONATATE 200 MG PO CAPS: 200.0000 mg | ORAL_CAPSULE | Freq: Three times a day (TID) | ORAL | 1 refills | Status: DC | PRN

## 2021-04-06 MED ORDER — CEFDINIR 300 MG PO CAPS
300.0000 mg | ORAL_CAPSULE | Freq: Two times a day (BID) | ORAL | 0 refills | Status: DC
Start: 2021-04-06 — End: 2021-04-13

## 2021-04-06 MED ORDER — CARBAMAZEPINE ER 200 MG PO CP12: 200.0000 mg | ORAL_CAPSULE | Freq: Two times a day (BID) | ORAL | 3 refills | Status: DC

## 2021-04-06 MED ORDER — PREDNISONE 20 MG PO TABS: ORAL_TABLET | ORAL | 0 refills | Status: DC

## 2021-04-06 NOTE — Progress Notes (Signed)
Telephone (Audio) Visit The purpose of this telephone visit is to provide medical care while limiting exposure to the novel coronavirus.    Consent was obtained for telephone visit:  Yes.   Answered questions that patient had about telehealth interaction:  Yes.   I discussed the limitations, risks, security and privacy concerns of performing an evaluation and management service by telephone. I also discussed with the patient that there may be a patient responsible charge related to this service. The patient expressed understanding and agreed to proceed.  Pt location: Home Physician Location: office Name of referring provider:  Nyoka Cowden, MD I connected with .Scot Dock at patients initiation/request on 04/06/2021 at  3:30 PM EST by telephone and verified that I am speaking with the correct person using two identifiers.  Pt MRN:  [id number] Pt DOB:  08-20-1945   History of Present Illness:  The patient had a telephone visit on 04/06/2021. She was last seen 9 months ago in the neurology clinic. She has a history of well-controlled seizures on carbamazepine ER 200mg  BID, seizure-free since 2000. She presented in for evaluation of 2 transient episodes of left hand weakness and heaviness that lasted for a minute in August 2021. MRI brain with and without contrast and MRA head and neck done 01/2020 did not show any acute changes, there was mild diffuse atrophy and chronic microvascular disease. 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 There was less than 50% stenosis in bilateral proximal ICAs. Her LDL was 106, total cholesterol 233, HDL 98, triglycerides 146, HbA1c 5.4. Since her last visit, she continues to do well with no further episodes of left hand weankness. She denies any headaches. She has occasional dizziness that she attributes to her medications. We had previously discussed starting aspirin  due to concern for TIA, she reports speaking to her cardiologist who did not think she needed aspirin. She currently has an URI and is taking prednisone.    History on Initial Assessment 12/31/2019: This is a pleasant 75 year old right-handed woman with a history of hypertension, complex partial seizures, presenting for second opinion. She had been seeing neurologist Dr. Maryln Gottron since at least 2013, records were reviewed. She was last seen in January 2021 with note of well-controlled seizures since on carbamazepine ER 200mg  BID. Her last carbamazepine level in January 2021 was 8.5. She reports having brief passing out episodes at age 42. She had preeclampsia with her first child in 41 and had post-partum convulsions. 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 It was not noticeable to others, she just held on to the table. She had 2 or 3 of them and was seen by Neurology and told there was a lesion on the left side. She was started on carbamazepine. She reports the last seizure was in 2000 and at that time dose was increased to carbamazepine ER 200mg  BID. She denies any side effects. Her last bone density scan done 8 years ago showed osteopenia, she was briefly on Fosamax, which was stopped by another provider. She denies any staring/unresponsive episodes, gaps in time, olfactory/gustatory hallucinations, myoclonic jerks. She denies any headaches, dizziness, diplopia, dysarthria/dysphagia, neck/back pain, bowel/bladder dysfunction. Her last fall was 2 months ago.   After she was referred and scheduled for this visit, she has  also had 2 recent episodes that occurred a week apart at the end of August. The first episode occurred while she was holding the TV remote control with her left hand and she had weakness in her left hand and a heaviness in her left arm. It felt like it went out for a minute, no  numbness/tingling/pain, then she felt fine. No associated headache, confusion. The second episode was at the end of August, she was typing on her computer then had the same sensation of weakness/heaviness in her left arm lasting 45-60 seconds. She also had one episode at night where there was a burning/itchiness on her left hand radiating to her fingers that lasted for 10 minutes. Face and leg were not affected. She reports having normal cholesterol levels when checked by her cardiologist, LDL in the 30s. Last bloodwork was over a year ago. She reports having an echocardiogram 4-6 weeks ago, reportedly normal.   Epilepsy Risk Factors:  She had a normal birth and early development.  There is no history of febrile convulsions, CNS infections such as meningitis/encephalitis, significant traumatic brain injury, neurosurgical procedures, or family history of seizures.    Observations/Objective:   Vitals:   04/06/21 1341  Weight: 155 lb (70.3 kg)  Height: 5' (1.524 m)   Exam limited due to nature of telephone visit. Patient is awake, alert, able to answer questions without confusion or dysarthria.  Assessment and Plan:   This is a pleasant 75 yo RH woman with a history of hypertension, complex partial seizures, seizure-free since 2000 on carbamazepine ER 200mg  BID, who had 2 brief episodes of left arm/hand weakness/heaviness, last episode in August 2021. MRI brain/MRA head and neck did not show any acute changes, no significant occlusion/stenosis. 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 Interval follow-up MRI brain with and without contrast (attention to CP angle) will be ordered. She has been doing well with no further episodes of left hand/arm weakness, continue with control of vascular risk factors, she states she has spoken to her cardiologist and has decided to hold off on aspirin. Follow-up in 1 year, call for any changes.     Follow Up Instructions:    -I discussed the assessment and treatment plan with the patient. The patient was provided an opportunity to ask questions and all were answered. The patient agreed with the plan and demonstrated an understanding of the instructions.   The patient was advised to call back or seek an in-person evaluation if the symptoms worsen or if the condition fails to improve as anticipated.    Total Time spent in visit with the patient was:  5:24, of which 100% of the time was spent in counseling and/or coordinating care on the above.   Pt understands and agrees with the plan of care outlined.     Van Clines, MD

## 2021-04-06 NOTE — Assessment & Plan Note (Signed)
Acute Asthmatic Bronchitic exacerbation slow to resolve-extend out antibiotics and steroids  Check chest xray today  Add trigger  Prevention for drainage .   Plan  Patient Instructions  Begin Delsym 2 tsp Twice daily  for cough As needed   Tessalon Three times a day  for cough as needed  Sips of water to avoid throat clearing and cough  Extend Omnicef for 3 days  Extend Prednisone 20mg  daily for 3 days then 10mg  daily for 3 days and stop.  Add Chlorpheniramine 4mg  At bedtime As needed  for drainage/cough  Chest xray today .  Continue on Symbicort 2 puffs Twice daily  , rinse after use.  Duoneb every 6hr as needed  Follow up with Dr. Melvyn Novas as planned next month and As needed   Please contact office for sooner follow up if symptoms do not improve or worsen or seek emergency care

## 2021-04-06 NOTE — Patient Instructions (Signed)
Begin Delsym 2 tsp Twice daily  for cough As needed   Tessalon Three times a day  for cough as needed  Sips of water to avoid throat clearing and cough  Extend Omnicef for 3 days  Extend Prednisone 20mg  daily for 3 days then 10mg  daily for 3 days and stop.  Add Chlorpheniramine 4mg  At bedtime As needed  for drainage/cough  Chest xray today .  Continue on Symbicort 2 puffs Twice daily  , rinse after use.  Duoneb every 6hr as needed  Follow up with Dr. Melvyn Novas as planned next month and As needed   Please contact office for sooner follow up if symptoms do not improve or worsen or seek emergency care

## 2021-04-06 NOTE — Progress Notes (Signed)
ATC x1, no answer, no VM.

## 2021-04-06 NOTE — Assessment & Plan Note (Signed)
Continue on current regimen - add trigger prevention   Plan  Patient Instructions  Begin Delsym 2 tsp Twice daily  for cough As needed   Tessalon Three times a day  for cough as needed  Sips of water to avoid throat clearing and cough  Extend Omnicef for 3 days  Extend Prednisone 20mg  daily for 3 days then 10mg  daily for 3 days and stop.  Add Chlorpheniramine 4mg  At bedtime As needed  for drainage/cough  Chest xray today .  Continue on Symbicort 2 puffs Twice daily  , rinse after use.  Duoneb every 6hr as needed  Follow up with Dr. Melvyn Novas as planned next month and As needed   Please contact office for sooner follow up if symptoms do not improve or worsen or seek emergency care

## 2021-04-06 NOTE — Progress Notes (Signed)
@Patient  ID: Allison Gross, female    DOB: [birth year], 75 y.o.   MRN: [id number]  Chief Complaint  Patient presents with   Follow-up    Referring provider: Nyoka Cowden, MD  HPI: 75 year old female with minimum smoking history followed for asthma and chronic rhinitis   TEST/EVENTS :  Chest x-ray September 23, 2020 shows linear left basilar atelectasis, clear lungs.,  Moderate size hiatal hernia  Spirometry 2019 showed moderate restriction with FEV1 at 68%, ratio 73, FVC 69%.  04/06/2021 Follow up : Asthma  Patient returns for a follow-up visit.  Patient complains over the last 7 days that she has developed a productive cough with thick yellow mucus    Husband had similar symptoms the week prior.  Patient tested for COVID-19 which was negative.  She was called in [place] for 7 days and a prednisone taper.  She is on her last day of Omnicef and Prednisone today.  Patient complains that she is not feeling a whole lot better , still coughing a lot of discolored mucus.  She remains on Symbicort twice daily. Has started Duoneb Three times a day  for last week. Also using flutter valve.  No fever or body aches, hemoptysis , no calf pain or swelling.  Husband was negative for flu and coivd .  Cough is keeping her up at night . Using Phenergan DM cough syrup is not helping at all.  Appetite is good . No nv/d    Allergies  Allergen Reactions   Sulfonamide Derivatives Nausea And Vomiting    Immunization History  Administered Date(s) Administered   Fluad Quad(high Dose 65+) 01/28/2021   Influenza Split 02/10/2011, 01/18/2015, 01/18/2016   Influenza Whole 01/18/2012, 01/27/2012   Influenza, High Dose Seasonal PF 02/10/2018   Influenza,inj,Quad PF,6+ Mos 01/03/2013   Influenza-Unspecified 01/17/2014, 01/17/2017   PFIZER(Purple Top)SARS-COV-2 Vaccination 05/05/2019, 05/26/2019, 01/11/2020, 08/26/2020   Pneumococcal Polysaccharide-23 01/18/2007    Past Medical History:  Diagnosis Date    Asthma    Cancer (HCC)    basil,squamous   Hypertension    Osteopenia    Seizures (HCC) 15 years ago   1982's from childbirth    Tobacco History: Social History   Tobacco Use  Smoking Status Former   Packs/day: 0.10   Years: 5.00   Pack years: 0.50   Types: Cigarettes   Quit date: 04/19/1984   Years since quitting: 36.9  Smokeless Tobacco Never  Tobacco Comments   reports smoked socially   Counseling given: Not Answered Tobacco comments: reports smoked socially   Outpatient Medications Prior to Visit  Medication Sig Dispense Refill   albuterol (PROAIR HFA) 108 (90 Base) MCG/ACT inhaler INHALE 2 PUFFS EVERY 4 HOURS AS NEEDED FOR WHEEZING AND SHORTNESS OF BREATH. 8.5 g 10   budesonide-formoterol (SYMBICORT) 80-4.5 MCG/ACT inhaler INHALE 2 PUFFS FIRST THING IN THE MORNING AND 2 PUFFS 12 HOURS LATER 10.2 g 4   calcium-vitamin D (OSCAL WITH D) 500-200 MG-UNIT per tablet Take 1 tablet by mouth daily with breakfast.     carbamazepine (CARBATROL) 200 MG 12 hr capsule Take 1 capsule (200 mg total) by mouth 2 (two) times daily. 180 capsule 3   cefdinir (OMNICEF) 300 MG capsule Take 1 capsule (300 mg total) by mouth 2 (two) times daily. 14 capsule 0   chlorpheniramine (CHLOR-TRIMETON) 4 MG tablet Take 4 mg by mouth every 4 (four) hours as needed (drippy nose, drainage, throat clearing).      famotidine (PEPCID) 20 MG tablet Take 20  mg by mouth at bedtime as needed.     fexofenadine (ALLEGRA) 180 MG tablet Take 180 mg by mouth every morning.     Guaifenesin 1200 MG TB12 Take 1 tablet by mouth 2 (two) times daily as needed.     ipratropium-albuterol (DUONEB) 0.5-2.5 (3) MG/3ML SOLN Take 3 mLs by nebulization every 4 (four) hours as needed (Wheezing/shortness of breath ((PLAN C))). 360 mL 2   losartan-hydrochlorothiazide (HYZAAR) 50-12.5 MG per tablet Take 1 tablet by mouth every morning.      melatonin 5 MG TABS Take 5 mg by mouth at bedtime. Takes 10mg  total HS     Multiple Vitamin  (MULTIVITAMIN) tablet Take 1 tablet by mouth every morning.     pantoprazole (PROTONIX) 40 MG tablet TAKE 1 TABLET BY MOUTH 30-60 MINUTES BEFORE FIRST MEAL OF THE DAY 30 tablet 11   potassium chloride (K-DUR) 10 MEQ tablet Take 1 tablet by mouth daily. ((2 tabs on Mondays and Fridays))     predniSONE (DELTASONE) 10 MG tablet Take  4 each am x 2 days,   2 each am x 2 days,  1 each am x 2 days and stop 14 tablet 0   promethazine-dextromethorphan (PROMETHAZINE-DM) 6.25-15 MG/5ML syrup Take 5 mLs by mouth every 4 (four) hours. 240 mL 0   Respiratory Therapy Supplies (FLUTTER) DEVI As instructed 1 each 0   Turmeric 500 MG CAPS Take 1 capsule by mouth every morning.      vitamin C (ASCORBIC ACID) 500 MG tablet Take 500 mg by mouth every morning.     ibuprofen (ADVIL,MOTRIN) 400 MG tablet Take 400 mg by mouth every 8 (eight) hours as needed (joint pain).  (Patient not taking: Reported on 04/06/2021)     benzonatate (TESSALON) 200 MG capsule Take 1 capsule (200 mg total) by mouth every 8 (eight) hours as needed for cough. (Patient not taking: Reported on 04/06/2021) 45 capsule 0   budesonide (RHINOCORT AQUA) 32 MCG/ACT nasal spray USE TWO SPRAYS IN EACH NOSTRIL EVERY DAY. (Patient not taking: Reported on 04/06/2021) 8.43 mL 0   Facility-Administered Medications Prior to Visit  Medication Dose Route Frequency Provider Last Rate Last Admin   0.9 %  sodium chloride infusion  500 mL Intravenous Continuous Meryl Dare, MD         Review of Systems:   Constitutional:   No  weight loss, night sweats,  Fevers, chills, + fatigue, or  lassitude.  HEENT:   No headaches,  Difficulty swallowing,  Tooth/dental problems, or  Sore throat,                No sneezing, itching, ear ache, nasal congestion, post nasal drip,   CV:  No chest pain,  Orthopnea, PND, swelling in lower extremities, anasarca, dizziness, palpitations, syncope.   GI  No heartburn, indigestion, abdominal pain, nausea, vomiting, diarrhea,  change in bowel habits, loss of appetite, bloody stools.   Resp:   No chest wall deformity  Skin: no rash or lesions.  GU: no dysuria, change in color of urine, no urgency or frequency.  No flank pain, no hematuria   MS:  No joint pain or swelling.  No decreased range of motion.  No back pain.    Physical Exam  BP 140/80 (BP Location: Left Arm, Cuff Size: Large)    Pulse (!) 52    Temp 98.4 F (36.9 C) (Oral)    Ht 5' (1.524 m)    Wt 157 lb (71.2 kg)  SpO2 95%    BMI 30.66 kg/m   GEN: A/Ox3; pleasant , NAD, well nourished    HEENT:  [place]/AT,  EACs-clear, TMs-wnl, NOSE-clear, THROAT-clear, no lesions, no postnasal drip or exudate noted.   NECK:  Supple w/ fair ROM; no JVD; normal carotid impulses w/o bruits; no thyromegaly or nodules palpated; no lymphadenopathy.    RESP  Clear  P & A; w/o, wheezes/ rales/ or rhonchi. no accessory muscle use, no dullness to percussion  CARD:  RRR, no m/r/g, tr + peripheral edema, pulses intact, no cyanosis or clubbing.  GI:   Soft & nt; nml bowel sounds; no organomegaly or masses detected.   Musco: Warm bil, no deformities or joint swelling noted.   Neuro: alert, no focal deficits noted.    Skin: Warm, no lesions or rashes    Lab Results:  CBC    Component Value Date/Time   WBC 5.3 06/13/2017 1108   RBC 4.44 06/13/2017 1108   HGB 10.6 (L) 06/13/2017 1108   HCT 32.8 (L) 06/13/2017 1108   PLT 336.0 06/13/2017 1108   MCV 73.8 (L) 06/13/2017 1108   MCHC 32.4 06/13/2017 1108   RDW 15.9 (H) 06/13/2017 1108   LYMPHSABS 1.3 06/13/2017 1108   MONOABS 0.5 06/13/2017 1108   EOSABS 0.1 06/13/2017 1108   BASOSABS 0.1 06/13/2017 1108    BMET No results found for: NA, K, CL, CO2, GLUCOSE, BUN, CREATININE, CALCIUM, GFRNONAA, GFRAA  BNP No results found for: BNP  ProBNP    Component Value Date/Time   PROBNP 69.0 06/13/2017 1108    Imaging: No results found.    No flowsheet data found.  Lab Results  Component Value Date    NITRICOXIDE 5 05/21/2015        Assessment & Plan:   Asthmatic bronchitis with acute exacerbation Acute Asthmatic Bronchitic exacerbation slow to resolve-extend out antibiotics and steroids  Check chest xray today  Add trigger  Prevention for drainage .   Plan  Patient Instructions  Begin Delsym 2 tsp Twice daily  for cough As needed   Tessalon Three times a day  for cough as needed  Sips of water to avoid throat clearing and cough  Extend Omnicef for 3 days  Extend Prednisone 20mg  daily for 3 days then 10mg  daily for 3 days and stop.  Add Chlorpheniramine 4mg  At bedtime As needed  for drainage/cough  Chest xray today .  Continue on Symbicort 2 puffs Twice daily  , rinse after use.  Duoneb every 6hr as needed  Follow up with Dr. Sherene Sires as planned next month and As needed   Please contact office for sooner follow up if symptoms do not improve or worsen or seek emergency care       Chronic rhinitis Continue on current regimen - add trigger prevention   Plan  Patient Instructions  Begin Delsym 2 tsp Twice daily  for cough As needed   Tessalon Three times a day  for cough as needed  Sips of water to avoid throat clearing and cough  Extend Omnicef for 3 days  Extend Prednisone 20mg  daily for 3 days then 10mg  daily for 3 days and stop.  Add Chlorpheniramine 4mg  At bedtime As needed  for drainage/cough  Chest xray today .  Continue on Symbicort 2 puffs Twice daily  , rinse after use.  Duoneb every 6hr as needed  Follow up with Dr. Sherene Sires as planned next month and As needed   Please contact office for sooner follow  up if symptoms do not improve or worsen or seek emergency care          Rubye Oaks, NP 04/06/2021

## 2021-04-06 NOTE — Telephone Encounter (Signed)
imaging to be done at 04/15/2021 at [facility]. no prior needed. patient advised and order to be faxed.for MRI brain with and without contrast.

## 2021-04-06 NOTE — Patient Instructions (Addendum)
Schedule MRI brain with and without contrast attention to CP angle  2. Continue carbamazepine ER 200mg  twice a day  3. Follow-up in 1 year, call for any changes   Seizure precautions: 1. If medication has been prescribed for you to prevent seizures, take it exactly as directed.  Do not stop taking the medicine without talking to your doctor first, even if you have not had a seizure in a long time.   2. Avoid activities in which a seizure would cause danger to yourself or to others.  Don't operate dangerous machinery, swim alone, or climb in high or dangerous places, such as on ladders, roofs, or girders.  Do not drive unless your doctor says you may.  3. If you have any warning that you may have a seizure, lay down in a safe place where you can't hurt yourself.    4.  No driving for 6 months from last seizure, as per [facility].   Please refer to the following link on the [place] website for more information: http://www.epilepsyfoundation.org/answerplace/Social/driving/drivingu.cfm   5.  Maintain good sleep hygiene.  6.  Contact your doctor if you have any problems that may be related to the medicine you are taking.  7.  Call 911 and bring the patient back to the ED if:        A.  The seizure lasts longer than 5 minutes.       B.  The patient doesn't awaken shortly after the seizure  C.  The patient has new problems such as difficulty seeing, speaking or moving  D.  The patient was injured during the seizure  E.  The patient has a temperature over 102 F (39C)  F.  The patient vomited and now is having trouble breathing

## 2021-04-07 NOTE — Telephone Encounter (Signed)
Spoke with pt and reviewed Tammy recommendations. Pt stated understanding. Nothing further needed at this time.

## 2021-04-07 NOTE — Telephone Encounter (Signed)
Recommend she finish her 10 d course of abx and follow ov recs Sorry to hear husband is sick .  If she develops fever, would get tested for Flu .Wash hands , wipe down surfaces  Her covid test was neg . If she has had the flu she is out of window for Tamiflu . If she get new acute sx , would then get tested for Flu if he is positive .   Please contact office for sooner follow up if symptoms do not improve or worsen or seek emergency care

## 2021-04-07 NOTE — Telephone Encounter (Signed)
Chest x-ray shows bronchitic changes with no evidence of pneumonia.  Continue with office visit recommendations and follow-up   Please contact office for sooner follow up if symptoms do not improve or worsen or seek emergency care.    I called the pt and she stated that she is doing the regimen that TP told her to follow.  Her cough is a little better per pt.  She stated that her husband now has ?flu and he is at the doctor now being tested.  He woke with 102 fever today.  Will forward back to TP as an [place]

## 2021-04-09 ENCOUNTER — Telehealth: Payer: Self-pay | Admitting: Adult Health

## 2021-04-09 NOTE — Progress Notes (Signed)
Called and spoke with patient, provided results/recommendations per Rexene Edison NP.  She verbalized understanding.  She stated she was not feeling any better and had called in earlier to let us know (please see sick call for details).  Nothing further needed.

## 2021-04-09 NOTE — Telephone Encounter (Signed)
Still coughing, especially in the morning with green mucous.  She is coughing more than when she was in the office previously.  Denies any fever, chills or body aches.  Husband tested positive for Covid on Tuesday and had been feeling poorly for a week.  Patient tested this morning and was negative.  She finishes the antibiotic she was prescribed tomorrow and Prednisone in a couple of days.  Please advise.  Thank you.

## 2021-04-09 NOTE — Telephone Encounter (Signed)
Patient completed a 10-day course of antibiotics.  Chest x-ray showed no sign of pneumonia.  We will have her finish up prednisone continue with office visit recommendations.  Would test for COVID-19 in the next couple days if she is positive call our office  If she is not doing any better will need a office visit next week.  If she gets worse she needs to go to the emergency room   Please contact office for sooner follow up if symptoms do not improve or worsen or seek emergency care

## 2021-04-09 NOTE — Telephone Encounter (Signed)
Called patient but she did not answer. Her VM is not setup so I could not leave a message. Will attempt to call back later today.

## 2021-04-09 NOTE — Telephone Encounter (Signed)
I called the patient and gave her the recommendations of Tammy Parrett NP and she was not happy that Tammy would not re-order or extend her medications. Nothing further needed.

## 2021-04-13 ENCOUNTER — Telehealth: Payer: Self-pay | Admitting: Adult Health

## 2021-04-13 MED ORDER — AMOXICILLIN-POT CLAVULANATE 875-125 MG PO TABS: 1.0000 | ORAL_TABLET | Freq: Two times a day (BID) | ORAL | 0 refills | Status: DC

## 2021-04-13 MED ORDER — PREDNISONE 20 MG PO TABS: 20.0000 mg | ORAL_TABLET | Freq: Every day | ORAL | 0 refills | Status: DC

## 2021-04-13 NOTE — Telephone Encounter (Signed)
Have Asthmatic bronchitis and sinus sx over last 2 weeks  Seen in office on 04/06/21 , Omnicef extended for total of 10 days and prednisone taper .   Tested positive on 12/23, Sx >5 days .   Now with worsening cough, sinus congestion . Thick green mucus .   Vaccinated x 4 for Covid .   Symbicort , albuterol , tessalon , delsym , chlor trimeton , pepcid and allegra. Duoneb qid   No pulse oximeter. Advised to get pulse oximeter . Call if O2 sats <90%.  Cough is keeping her up night . Cant take hydrocodone cough syrup-makes her sick.   Good appetite , drinking plenty fluids.  No hemotpysis , chest pain .   Begin Augmentin x 7 days , prednisone 20mg  daily for 5 days . Continue with above regimen , continue on probiotic . Rx sent to pharm   Call office tomorrow for follow up in 2 weeks .  Please contact office for sooner follow up if symptoms do not improve or worsen or seek emergency care

## 2021-04-23 ENCOUNTER — Telehealth: Payer: Self-pay | Admitting: Neurology

## 2021-04-23 NOTE — Telephone Encounter (Signed)
Patient wanted you to know that she has had to resch the MRI twice due to her having COVID. She is sch for sometime next week she hopes that she will be able to do it at that time. She is still coughing really bad

## 2021-04-24 NOTE — Telephone Encounter (Signed)
Noted  

## 2021-04-28 ENCOUNTER — Ambulatory Visit (INDEPENDENT_AMBULATORY_CARE_PROVIDER_SITE_OTHER): Payer: Medicare Other | Admitting: Adult Health

## 2021-04-28 ENCOUNTER — Ambulatory Visit: Payer: PRIVATE HEALTH INSURANCE | Admitting: Adult Health

## 2021-04-28 ENCOUNTER — Other Ambulatory Visit: Payer: Self-pay

## 2021-04-28 ENCOUNTER — Encounter: Payer: Self-pay | Admitting: Adult Health

## 2021-04-28 ENCOUNTER — Ambulatory Visit (INDEPENDENT_AMBULATORY_CARE_PROVIDER_SITE_OTHER): Payer: Medicare Other

## 2021-04-28 VITALS — BP 136/78 | HR 94 | Temp 98.3°F | Ht 60.0 in | Wt 152.6 lb

## 2021-04-28 DIAGNOSIS — J4541 Moderate persistent asthma with (acute) exacerbation: Secondary | ICD-10-CM

## 2021-04-28 DIAGNOSIS — J310 Chronic rhinitis: Secondary | ICD-10-CM

## 2021-04-28 MED ORDER — BENZONATATE 200 MG PO CAPS: 200.0000 mg | ORAL_CAPSULE | Freq: Three times a day (TID) | ORAL | 1 refills | Status: DC | PRN

## 2021-04-28 MED ORDER — METHYLPREDNISOLONE ACETATE 80 MG/ML IJ SUSP
80.0000 mg | Freq: Once | INTRAMUSCULAR | Status: AC
  Administered 2021-04-28: 80 mg via INTRAMUSCULAR

## 2021-04-28 NOTE — Patient Instructions (Addendum)
Chest xray today .  Begin Robitussin DM 2 every 4hr as needed for cough.  Tessalon Three times a day  for cough as needed  Sips of water to avoid throat clearing and cough  Continue on Protonix 40mg  daily  Continue on Allegra daily  Continue on Pepcid 20mg  At bedtime   Add Chlorpheniramine 4mg  At bedtime As needed  for drainage/cough -may use every 4-6 hr as needed -use with caution as causes balance issues and sleepiness.  Continue on Symbicort 2 puffs Twice daily  , rinse after use.  Duoneb every 6hr as needed  Follow up with Dr. Melvyn Novas as planned next month and As needed   Please contact office for sooner follow up if symptoms do not improve or worsen or seek emergency care

## 2021-04-28 NOTE — Assessment & Plan Note (Signed)
Slow to resolve exacerbation with recent COVID-19 infection.  We will check chest x-ray to rule out possible underlying superimposed pneumonia.  However patient is completed 2 courses of antibiotics.  She has no further discolored mucus.  We will hold on additional antibiotics this time.  We will give a Depo-Medrol shot today in office.  And increase her cough control regimen.  Plan  Patient Instructions  Chest xray today .  Begin Robitussin DM 2 every 4hr as needed for cough.  Tessalon Three times a day  for cough as needed  Sips of water to avoid throat clearing and cough  Continue on Protonix 40mg  daily  Continue on Allegra daily  Continue on Pepcid 20mg  At bedtime   Add Chlorpheniramine 4mg  At bedtime As needed  for drainage/cough -may use every 4-6 hr as needed -use with caution as causes balance issues and sleepiness.  Continue on Symbicort 2 puffs Twice daily  , rinse after use.  Duoneb every 6hr as needed  Follow up with Dr. Melvyn Novas as planned next month and As needed   Please contact office for sooner follow up if symptoms do not improve or worsen or seek emergency care

## 2021-04-28 NOTE — Addendum Note (Signed)
Addended by: Vanessa Barbara on: 04/28/2021 02:21 PM   Modules accepted: Orders

## 2021-04-28 NOTE — Assessment & Plan Note (Signed)
Acute flare with recent COVID-19 Patient is advised she may use her Chlor-Trimeton as needed every 4hr   She is use this for a long time.  Advised of sedating effect.  Plan  Patient Instructions  Chest xray today .  Begin Robitussin DM 2 every 4hr as needed for cough.  Tessalon Three times a day  for cough as needed  Sips of water to avoid throat clearing and cough  Continue on Protonix 40mg  daily  Continue on Allegra daily  Continue on Pepcid 20mg  At bedtime   Add Chlorpheniramine 4mg  At bedtime As needed  for drainage/cough -may use every 4-6 hr as needed -use with caution as causes balance issues and sleepiness.  Continue on Symbicort 2 puffs Twice daily  , rinse after use.  Duoneb every 6hr as needed  Follow up with Dr. Melvyn Novas as planned next month and As needed   Please contact office for sooner follow up if symptoms do not improve or worsen or seek emergency care

## 2021-04-28 NOTE — Progress Notes (Signed)
@Patient  ID: Allison Gross, female    DOB: 03-29-1946, 76 y.o.   MRN: [id number]  Chief Complaint  Patient presents with   Follow-up    Referring provider: Nyoka Cowden, MD  HPI: 76 year old female with minimum smoking history followed for asthma and chronic rhinitis Medical history significant for seizure disorder   TEST/EVENTS :  Chest x-ray September 23, 2020 shows linear left basilar atelectasis, clear lungs.,  Moderate size hiatal hernia   Spirometry 2019 showed moderate restriction with FEV1 at 68%, ratio 73, FVC 69%.    04/28/2021 Follow up : Asthma and Covid  Patient returns for a 3-week follow-up.  Patient recently had an asthmatic bronchitic flare.  She also had COVID-19 infection after being exposed to her husband who was positive.  She was treated with a 10-day course of Omnicef and a prednisone taper.  Chest x-ray showed increased interstitial markings consistent with acute bronchitis.  Symptoms had been going on for longer than 5 days.  She was not prescribed an antiviral pack.  Patient called into the office on December 26 with ongoing symptoms of cough and congestion.  She was started on Augmentin and prednisone.  Since last visit patient is feeling some better but continues to have ongoing cough that is keeping her up at night.  Her cough is now with clear mucus.  She denies any fever chest pain orthopnea or leg swelling.  She has no calf pain or hemoptysis.  Appetite is very good.  Patient says she has been using Tessalon for cough but is not helping.  She stopped using her Delsym.  Does have some Phenergan cough syrup that she uses on occasion.   Allergies  Allergen Reactions   Sulfonamide Derivatives Nausea And Vomiting    Immunization History  Administered Date(s) Administered   Fluad Quad(high Dose 65+) 01/28/2021   Influenza Split 02/10/2011, 01/18/2015, 01/18/2016   Influenza Whole 01/18/2012, 01/27/2012   Influenza, High Dose Seasonal PF 02/10/2018    Influenza,inj,Quad PF,6+ Mos 01/03/2013   Influenza-Unspecified 01/17/2014, 01/17/2017   PFIZER(Purple Top)SARS-COV-2 Vaccination 05/05/2019, 05/26/2019, 01/11/2020, 06/10/2020, 08/26/2020   Pneumococcal Polysaccharide-23 01/18/2007    Past Medical History:  Diagnosis Date   Asthma    Cancer (HCC)    basil,squamous   Hypertension    Osteopenia    Seizures (HCC) 15 years ago   1982's from childbirth    Tobacco History: Social History   Tobacco Use  Smoking Status Former   Packs/day: 0.10   Years: 5.00   Pack years: 0.50   Types: Cigarettes   Quit date: 04/19/1984   Years since quitting: 37.0  Smokeless Tobacco Never  Tobacco Comments   reports smoked socially   Counseling given: Not Answered Tobacco comments: reports smoked socially   Outpatient Medications Prior to Visit  Medication Sig Dispense Refill   albuterol (PROAIR HFA) 108 (90 Base) MCG/ACT inhaler INHALE 2 PUFFS EVERY 4 HOURS AS NEEDED FOR WHEEZING AND SHORTNESS OF BREATH. 8.5 g 10   budesonide-formoterol (SYMBICORT) 80-4.5 MCG/ACT inhaler INHALE 2 PUFFS FIRST THING IN THE MORNING AND 2 PUFFS 12 HOURS LATER 10.2 g 4   calcium-vitamin D (OSCAL WITH D) 500-200 MG-UNIT per tablet Take 1 tablet by mouth daily with breakfast.     carbamazepine (CARBATROL) 200 MG 12 hr capsule Take 1 capsule (200 mg total) by mouth 2 (two) times daily. 180 capsule 3   chlorpheniramine (CHLOR-TRIMETON) 4 MG tablet Take 4 mg by mouth every 4 (four) hours as needed (drippy nose, drainage,  throat clearing).      famotidine (PEPCID) 20 MG tablet Take 20 mg by mouth at bedtime as needed.     fexofenadine (ALLEGRA) 180 MG tablet Take 180 mg by mouth every morning.     Guaifenesin 1200 MG TB12 Take 1 tablet by mouth 2 (two) times daily as needed.     ibuprofen (ADVIL,MOTRIN) 400 MG tablet Take 400 mg by mouth every 8 (eight) hours as needed (joint pain).     ipratropium-albuterol (DUONEB) 0.5-2.5 (3) MG/3ML SOLN Take 3 mLs by nebulization  every 4 (four) hours as needed (Wheezing/shortness of breath ((PLAN C))). 360 mL 2   losartan-hydrochlorothiazide (HYZAAR) 50-12.5 MG per tablet Take 1 tablet by mouth every morning.      melatonin 5 MG TABS Take 5 mg by mouth at bedtime. Takes 10mg  total HS     Multiple Vitamin (MULTIVITAMIN) tablet Take 1 tablet by mouth every morning.     pantoprazole (PROTONIX) 40 MG tablet TAKE 1 TABLET BY MOUTH 30-60 MINUTES BEFORE FIRST MEAL OF THE DAY 30 tablet 11   potassium chloride (K-DUR) 10 MEQ tablet Take 1 tablet by mouth daily. ((2 tabs on Mondays and Fridays))     promethazine-dextromethorphan (PROMETHAZINE-DM) 6.25-15 MG/5ML syrup Take 5 mLs by mouth every 4 (four) hours. 240 mL 0   Respiratory Therapy Supplies (FLUTTER) DEVI As instructed 1 each 0   Turmeric 500 MG CAPS Take 1 capsule by mouth every morning.      vitamin C (ASCORBIC ACID) 500 MG tablet Take 500 mg by mouth every morning.     benzonatate (TESSALON) 200 MG capsule Take 1 capsule (200 mg total) by mouth 3 (three) times daily as needed for cough. 45 capsule 1   amoxicillin-clavulanate (AUGMENTIN) 875-125 MG tablet Take 1 tablet by mouth 2 (two) times daily. (Patient not taking: Reported on 04/28/2021) 14 tablet 0   predniSONE (DELTASONE) 20 MG tablet 1 tab daily for 3 days then 1/2 tab daily for 3 days (Patient not taking: Reported on 04/28/2021) 6 tablet 0   predniSONE (DELTASONE) 20 MG tablet Take 1 tablet (20 mg total) by mouth daily with breakfast. (Patient not taking: Reported on 04/28/2021) 5 tablet 0   Facility-Administered Medications Prior to Visit  Medication Dose Route Frequency Provider Last Rate Last Admin   0.9 %  sodium chloride infusion  500 mL Intravenous Continuous Meryl Dare, MD         Review of Systems:   Constitutional:   No  weight loss, night sweats,  Fevers, chills,  +fatigue, or  lassitude.  HEENT:   No headaches,  Difficulty swallowing,  Tooth/dental problems, or  Sore throat,                No  sneezing, itching, ear ache,  +nasal congestion, post nasal drip,   CV:  No chest pain,  Orthopnea, PND, swelling in lower extremities, anasarca, dizziness, palpitations, syncope.   GI  No heartburn, indigestion, abdominal pain, nausea, vomiting, diarrhea, change in bowel habits, loss of appetite, bloody stools.   Resp:   No chest wall deformity  Skin: no rash or lesions.  GU: no dysuria, change in color of urine, no urgency or frequency.  No flank pain, no hematuria   MS:  No joint pain or swelling.  No decreased range of motion.  No back pain.    Physical Exam  BP 136/78 (BP Location: Left Arm, Patient Position: Sitting, Cuff Size: Normal)    Pulse 94  Temp 98.3 F (36.8 C) (Oral)    Ht 5' (1.524 m)    Wt 152 lb 9.6 oz (69.2 kg)    SpO2 95%    BMI 29.80 kg/m   GEN: A/Ox3; pleasant , NAD, well nourished    HEENT:  /AT,  EACs-clear, TMs-wnl, NOSE-clear, THROAT-clear, no lesions, no postnasal drip or exudate noted.   NECK:  Supple w/ fair ROM; no JVD; normal carotid impulses w/o bruits; no thyromegaly or nodules palpated; no lymphadenopathy.    RESP  Clear  P & A; w/o, wheezes/ rales/ or rhonchi. no accessory muscle use, no dullness to percussion  CARD:  RRR, no m/r/g, no peripheral edema, pulses intact, no cyanosis or clubbing.  GI:   Soft & nt; nml bowel sounds; no organomegaly or masses detected.   Musco: Warm bil, no deformities or joint swelling noted.   Neuro: alert, no focal deficits noted.    Skin: Warm, no lesions or rashes   ProBNP  Imaging: DG Chest 2 View  Result Date: 04/06/2021 CLINICAL DATA:  asthmatic bronchitis EXAM: CHEST - 2 VIEW COMPARISON:  09/23/2020 FINDINGS: Stable heart size. Moderate hiatal hernia. Atherosclerotic calcification of the aortic knob. Chronically coarsened interstitial markings bilaterally. No focal airspace consolidation. No pleural effusion or pneumothorax. Exaggerated thoracic kyphosis. IMPRESSION: Chronically coarsened  interstitial markings suggest bronchitic type lung changes. Electronically Signed   By: Duanne Guess D.O.   On: 04/06/2021 11:40      No flowsheet data found.  Lab Results  Component Value Date   NITRICOXIDE 5 05/21/2015        Assessment & Plan:   Asthmatic bronchitis with acute exacerbation Slow to resolve exacerbation with recent COVID-19 infection.  We will check chest x-ray to rule out possible underlying superimposed pneumonia.  However patient is completed 2 courses of antibiotics.  She has no further discolored mucus.  We will hold on additional antibiotics this time.  We will give a Depo-Medrol shot today in office.  And increase her cough control regimen.  Plan  Patient Instructions  Chest xray today .  Begin Robitussin DM 2 every 4hr as needed for cough.  Tessalon Three times a day  for cough as needed  Sips of water to avoid throat clearing and cough  Continue on Protonix 40mg  daily  Continue on Allegra daily  Continue on Pepcid 20mg  At bedtime   Add Chlorpheniramine 4mg  At bedtime As needed  for drainage/cough -may use every 4-6 hr as needed -use with caution as causes balance issues and sleepiness.  Continue on Symbicort 2 puffs Twice daily  , rinse after use.  Duoneb every 6hr as needed  Follow up with Dr. Sherene Sires as planned next month and As needed   Please contact office for sooner follow up if symptoms do not improve or worsen or seek emergency care        Chronic rhinitis Acute flare with recent COVID-19 Patient is advised she may use her Chlor-Trimeton as needed every 4hr   She is use this for a long time.  Advised of sedating effect.  Plan  Patient Instructions  Chest xray today .  Begin Robitussin DM 2 every 4hr as needed for cough.  Tessalon Three times a day  for cough as needed  Sips of water to avoid throat clearing and cough  Continue on Protonix 40mg  daily  Continue on Allegra daily  Continue on Pepcid 20mg  At bedtime   Add  Chlorpheniramine 4mg  At bedtime As needed  for  drainage/cough -may use every 4-6 hr as needed -use with caution as causes balance issues and sleepiness.  Continue on Symbicort 2 puffs Twice daily  , rinse after use.  Duoneb every 6hr as needed  Follow up with Dr. Sherene Sires as planned next month and As needed   Please contact office for sooner follow up if symptoms do not improve or worsen or seek emergency care          Rubye Oaks, NP 04/28/2021

## 2021-05-05 ENCOUNTER — Encounter: Payer: Self-pay | Admitting: Internal Medicine

## 2021-05-05 ENCOUNTER — Other Ambulatory Visit: Payer: Self-pay

## 2021-05-05 ENCOUNTER — Ambulatory Visit (INDEPENDENT_AMBULATORY_CARE_PROVIDER_SITE_OTHER): Payer: Medicare Other | Admitting: Internal Medicine

## 2021-05-05 DIAGNOSIS — R058 Other specified cough: Secondary | ICD-10-CM

## 2021-05-05 MED ORDER — PANTOPRAZOLE SODIUM 40 MG PO TBEC: DELAYED_RELEASE_TABLET | ORAL | 2 refills | Status: DC

## 2021-05-05 MED ORDER — TRAMADOL HCL 50 MG PO TABS: 50.0000 mg | ORAL_TABLET | ORAL | 0 refills | Status: AC | PRN

## 2021-05-05 NOTE — Progress Notes (Signed)
Subjective:     Patient ID: Allison Gross, female   DOB: 1945/12/11   MRN: [id number]   Brief patient profile:  75 yowf quit light smoking 1973 with intermittent sinus/ bronchitis but no chronic c/o's or need for rx not much better after stopped smoking then much worse after around 2000 despite daily maint rx for asthma. Previously found to be allergic to dust, cats and trees and mold but no seasonal pattern and did not feel allergy shots helped in 1990s      History of Present Illness  09/21/2013 Acute OV  Complains of hoarseness, some increased SOB, low grade temp, dry hacking cough with occasional yellow mucus, some tightness x 1 week.  She denies any calf pain, edema, hemoptysis, orthopnea, PND, nausea, vomiting, diarrhea. Does feel, that she's had a low-grade fever. Returned yesterday from a 2 week cruise.  Says many people on the cruise were sick with cough and similar symptoms. Was in [place].  Finished zpak on 6/3 and pred pak today by ships' physician. Has been using tramadol to today for cough with minimal help rec Levaquin 500mg  daily for 7 days  Prednisone taper over next week.  Delsym 2 tsp Twice daily   For cough  Tramadol 50mg  1 every 4hr as needed for breakthrough cough.  Mucinex Twice daily  As needed  Congestion . 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  She has not had to use her albuterol inhaler or neb recently.   Dyspnea:  Walking  Not as much but Not limited by breathing from desired activities   Cough: resolved/ off tramadol  Sleeping: fine flat bed with large pillow SABA use: occ before ex rec If cough flares, use tramadol 50 mg up to every 4 hours or up to 3 days and if not improving we need to see you right away    10/25/2018  f/u ov/Carlson Belland re:  cough variant asthma  maint on symb 80 2bid  Chief Complaint  Patient presents with   Follow-up    Cough still not a bother to her and her breathing is doing well. She has not been using her albuterol often.   Dyspnea:  Back walking agin  / playing golf ok / really Not limited by breathing from desired activities   Cough: resolved  Sleeping: flat with one pillow  SABA use: none rec If cough flares, use tramadol 50 mg up to every 4 hours or up to 3 days and if not improving we need to see you right away   06/03/2020  f/u ov/Nafeesa Dils re: cough variant asthma  Chief Complaint  Patient presents with   Follow-up    Shortness of breath when its cold out  Dyspnea:  Walking neighborhood when warm, hills a bit of problem but no change baseline   Cough: none  Sleeping:  Flat bed, one puffy pillow sleeps fine  SABA use: albuterol before ex sometimes if wait til pm to exercsie   02: none Covid status: vax x 3    Rec No change in medications needed but take pepcid 20 mg in evening from now until the day of the surgery as well as symbcort  Cough started a few days prior to Dec 21 POS COVID  testing  and the "worst ever since"   05/05/2021  f/u ov/Michalene Debruler re: cough variant asthma   maint on symb 80 2bid / ppi ac  H1 did not help the noct cough  Chief Complaint  Patient presents with   Follow-up    Cough is better but has not resolved completely. She has been using neb 2 x daily.   Best rx sipping warm water Dyspnea: better  Cough: better x 4-5 days but still nocturnla worse despite 1st gen H1 blockers per guidelines   Sleeping: flat bed with pillow  SABA use: not sure  02: none  Covid status:   vax x 4    No obvious day to day or daytime variability or assoc excess/ purulent sputum or mucus plugs or hemoptysis or cp or chest tightness, subjective wheeze or overt sinus or hb symptoms.    Also denies any obvious fluctuation of symptoms with weather or environmental changes or other aggravating or  alleviating factors except as outlined above   No unusual exposure hx or h/o childhood pna/ asthma or knowledge of premature birth.  Current Allergies, Complete Past Medical History, Past Surgical History, Family History, and Social History were reviewed in [employer] record.  ROS  The following are not active complaints unless bolded Hoarseness, sore throat, dysphagia, dental problems, itching, sneezing,  nasal congestion or discharge of excess mucus or purulent secretions, ear ache,   fever, chills, sweats, unintended wt loss or wt gain, classically pleuritic or exertional cp,  orthopnea pnd or arm/hand swelling  or leg swelling, presyncope, palpitations, abdominal pain, anorexia, nausea, vomiting, diarrhea  or change in bowel habits or change in bladder habits, change in stools or change in urine, dysuria, hematuria,  rash, arthralgias, visual complaints, headache, numbness, weakness or ataxia or problems with walking or coordination,  change in mood or  memory.        Current Meds  Medication Sig   albuterol (PROAIR HFA) 108 (90 Base) MCG/ACT inhaler INHALE 2 PUFFS EVERY 4 HOURS AS NEEDED FOR WHEEZING AND SHORTNESS OF BREATH.   benzonatate (TESSALON) 200 MG capsule Take 1 capsule (200 mg total) by mouth 3 (three) times daily as needed for cough.   budesonide-formoterol (SYMBICORT) 80-4.5 MCG/ACT inhaler INHALE 2 PUFFS FIRST THING IN THE MORNING AND 2 PUFFS 12 HOURS LATER   calcium-vitamin D (OSCAL WITH D) 500-200 MG-UNIT per tablet Take 1 tablet by mouth daily with breakfast.   carbamazepine (CARBATROL) 200 MG 12 hr capsule Take 1 capsule (200 mg total) by mouth 2 (two) times daily.   chlorpheniramine (CHLOR-TRIMETON) 4 MG tablet Stopped, didn't help   famotidine (PEPCID) 20 MG tablet Stopped, didn't help    fexofenadine (ALLEGRA) 180 MG tablet Take 180 mg by mouth every morning.   Guaifenesin 1200 MG TB12 Take 1 tablet by mouth 2 (two) times daily as needed.    ibuprofen (ADVIL,MOTRIN) 400 MG tablet Take 400 mg by mouth every 8 (eight) hours as needed (joint pain).   ipratropium-albuterol (DUONEB) 0.5-2.5 (3) MG/3ML SOLN Take 3 mLs by nebulization every 4 (four) hours as needed (Wheezing/shortness of breath ((PLAN C))).   losartan-hydrochlorothiazide (HYZAAR) 50-12.5 MG per tablet Take 1 tablet by mouth every morning.    melatonin 5 MG TABS Take 5 mg by mouth at bedtime. Takes 10mg  total HS   Multiple Vitamin (MULTIVITAMIN) tablet Take 1 tablet by mouth every morning.   pantoprazole (PROTONIX) 40 MG tablet TAKE 1 TABLET BY MOUTH 30-60 MINUTES BEFORE FIRST MEAL OF  THE DAY   potassium chloride (K-DUR) 10 MEQ tablet Take 1 tablet by mouth daily. ((2 tabs on Mondays and Fridays))   promethazine-dextromethorphan (PROMETHAZINE-DM) 6.25-15 MG/5ML syrup Take 5 mLs by mouth every 4 (four) hours.   Respiratory Therapy Supplies (FLUTTER) DEVI As instructed   Turmeric 500 MG CAPS Take 1 capsule by mouth every morning.    vitamin C (ASCORBIC ACID) 500 MG tablet Take 500 mg by mouth every morning.         Past Medical History:  Asthma  - HFA 50% November 10, 2009 > 75% December 24, 2009 > 90% March 26, 2010  Osteopenia  - hold fosfamax 09/2009 >> better with active HB so rec Reclast rx         Objective:  Physical Exam  05/05/2021    153  10/25/2018    158  02/21/2018   155  07/14/2012   158 = baseline /   11/13/2014 162 > 02/14/2015   159 >  06/09/2015  157 > 11/13/2015  156 > 05/17/2016  161 > 08/02/2016  161 >  12/16/2016  158 > 06/13/2017  160 > 12/14/2017  95%     Vital signs reviewed  05/05/2021  - Note at rest 02 sats  96% on RA   General appearance:    amb somber wf nad/ occ mild  throat clearing    HEENT : pt wearing mask not removed for exam due to covid -19 concerns.    NECK :  without JVD/Nodes/TM/ nl carotid upstrokes bilaterally   LUNGS: no acc muscle use,  Nl contour chest which is clear to A and P bilaterally without cough on insp or exp  maneuvers   CV:  RRR  no s3 or murmur or increase in P2, and no edema   ABD:  soft and nontender with nl inspiratory excursion in the supine position. No bruits or organomegaly appreciated, bowel sounds nl  MS:  Nl gait/ ext warm without deformities, calf tenderness, cyanosis or clubbing No obvious joint restrictions   SKIN: warm and dry without lesions    NEURO:  alert, approp, nl sensorium with  no motor or cerebellar deficits apparent.          Assessment:

## 2021-05-05 NOTE — Assessment & Plan Note (Signed)
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  Of the three most common causes of  Sub-acute / recurrent or chronic cough, only one (GERD)  can actually contribute to/ trigger  the other two (asthma and post nasal drip syndrome)  and perpetuate the cylce of cough.  While not intuitively obvious, many patients with chronic low grade reflux do not cough until there is a primary insult that disturbs the protective epithelial barrier and exposes sensitive nerve endings.   This is typically viral but can due to PNDS and  either may apply here.   The point is that once this occurs, it is difficult to eliminate the cycle  using anything but a maximally effective acid suppression regimen at least in the short run, accompanied by an appropriate diet to address non acid GERD and control / eliminate the cough itself for at least 3 days  With tramadol and avoid further prednisone if possible   Discussed in detail all the  indications, usual  risks and alternatives  relative to the benefits with patient who agrees to proceed with Rx as outlined.             Each maintenance medication was reviewed in detail including emphasizing most importantly the difference between maintenance and prns and under what circumstances the prns are to be triggered using an action plan format where appropriate.  Total time for H and P, chart review, counseling, reviewing hfa device(s) and generating customized AVS unique to this office visit / same day charting = 28 min

## 2021-05-05 NOTE — Patient Instructions (Addendum)
Take delsym two tsp every 12 hours and supplement if needed with  tramadol 50 mg up to 1-2 every 4 hours to suppress the urge to cough. Swallowing water and/or using ice chips/non mint and menthol containing candies (such as lifesavers or sugarless jolly ranchers) are also effective.  You should rest your voice and avoid activities that you know make you cough.  Once you have eliminated the cough for 3 straight days try reducing the tramadol first,  then the delsym as tolerated.    When cough flares > protonix  40 mg Take 30- 60 min before your first and last meals of the day until better week and no need for cough medication for a week.  Please schedule a follow up visit in 3 months but call sooner if needed

## 2021-05-06 NOTE — Progress Notes (Signed)
ATC x1.  No answer, no VM.

## 2021-05-11 ENCOUNTER — Encounter: Payer: Self-pay | Admitting: *Deleted

## 2021-05-11 NOTE — Progress Notes (Signed)
Attempted to call x2.  No answer, no VM.  Unable to reach letter sent.

## 2021-05-19 ENCOUNTER — Telehealth: Payer: Self-pay | Admitting: Neurology

## 2021-05-19 NOTE — Telephone Encounter (Signed)
The following message was left with [employer] on 05/19/21 at 12:11PM.   She'd like to know if her MRI results are back from 05/08/21. Did Dr. Delice Lesch get them? Will patient need a consult visit?

## 2021-05-19 NOTE — Telephone Encounter (Signed)
I don't have the results in Results folder or Media tab. MRI was done in [place], can you pls request report, thanks

## 2021-05-22 NOTE — Telephone Encounter (Signed)
Allison Gross called they are going to fax over MRI results

## 2021-05-26 NOTE — Telephone Encounter (Signed)
Pls let her know I received the MRI report, it looks good, no changes from her last scan. We will repeat scan in a year, call for any changes. Thanks

## 2021-05-26 NOTE — Telephone Encounter (Signed)
Patient advised of results.

## 2021-06-03 ENCOUNTER — Ambulatory Visit: Payer: PRIVATE HEALTH INSURANCE | Admitting: Internal Medicine

## 2021-08-10 ENCOUNTER — Encounter: Payer: Self-pay | Admitting: Gastroenterology

## 2021-08-26 ENCOUNTER — Telehealth: Payer: Self-pay

## 2021-08-26 ENCOUNTER — Telehealth: Payer: Self-pay | Admitting: Internal Medicine

## 2021-08-26 ENCOUNTER — Other Ambulatory Visit: Payer: Self-pay

## 2021-08-26 MED ORDER — ALBUTEROL SULFATE HFA 108 (90 BASE) MCG/ACT IN AERS: INHALATION_SPRAY | RESPIRATORY_TRACT | 10 refills | Status: DC

## 2021-08-26 NOTE — Telephone Encounter (Signed)
Medication refill sent. Nothing further needed  ?

## 2021-08-26 NOTE — Telephone Encounter (Signed)
Called and spoke with pharmacy to let them know that it is ok to switch inhalers. They understanding. Nothing further needed at this time. ?

## 2021-08-26 NOTE — Telephone Encounter (Signed)
Patients pharmacy sent over a fax stating that her proair HFA is not covered any longer with current insurance.  ?Suggest rx for albuterol HFA.  ? ?Dr. Melvyn Novas are you ok with this?  ?

## 2021-08-26 NOTE — Telephone Encounter (Signed)
That's fine

## 2021-08-31 ENCOUNTER — Other Ambulatory Visit: Payer: Self-pay | Admitting: Internal Medicine

## 2021-09-08 ENCOUNTER — Encounter: Payer: Self-pay | Admitting: Gastroenterology

## 2021-09-22 ENCOUNTER — Encounter: Payer: PRIVATE HEALTH INSURANCE | Admitting: Gastroenterology

## 2021-09-24 ENCOUNTER — Ambulatory Visit (INDEPENDENT_AMBULATORY_CARE_PROVIDER_SITE_OTHER): Payer: Medicare Other | Admitting: Gastroenterology

## 2021-09-24 ENCOUNTER — Encounter: Payer: Self-pay | Admitting: Gastroenterology

## 2021-09-24 VITALS — BP 140/84 | HR 77 | Ht 59.0 in | Wt 153.4 lb

## 2021-09-24 DIAGNOSIS — Z86010 Personal history of colonic polyps: Secondary | ICD-10-CM | POA: Diagnosis not present

## 2021-09-24 DIAGNOSIS — R194 Change in bowel habit: Secondary | ICD-10-CM

## 2021-09-24 MED ORDER — NA SULFATE-K SULFATE-MG SULF 17.5-3.13-1.6 GM/177ML PO SOLN: 1.0000 | Freq: Once | ORAL | 0 refills | Status: AC

## 2021-09-24 NOTE — Progress Notes (Signed)
Assessment    Personal history of adenomatous colon polyps. Change in bowel habits, mild constipation  Small periumbilical hernia  Recommendations   Increase daily dietary fiber and water intake Schedule colonoscopy. The risks (including bleeding, perforation, infection, missed lesions, medication reactions and possible hospitalization or surgery if complications occur), benefits, and alternatives to colonoscopy with possible biopsy and possible polypectomy were discussed with the patient and they consent to proceed.     HPI   Chief complaint: Personal history of colon polyps, change in bowel habits  Patient profile:  Allison Gross is a 76 y.o. female referred by Christinia Gully, MD for change in bowel habits and a personal history of adenomatous colon polyps.  She noted a change in bowel habits in December 2022, frequently noting smaller pellet-like stools.  She states that following her cholecystectomy about 20 years ago her bowel habits changed and she generally has 2 formed bowel movements daily.  She reports a small umbilical hernia that is monitored by her surgeon and with minimal symptoms operative intervention is not currently planned.  Colonoscopy in 2018 showed 2 small tubular adenomas.  She is in very good health. Denies weight loss, abdominal pain, diarrhea, melena, hematochezia, nausea, vomiting, dysphagia, reflux symptoms, chest pain.   Previous GI evaluation   Endoscopies:  Colonoscopy April 2018 - One 5 mm polyp in the descending colon, removed with a cold biopsy forceps. Resected and retrieved. - Two 6 to 7 mm polyps in the sigmoid colon, removed with a cold snare. Resected and retrieved. - Diverticulosis in the sigmoid colon. - Internal hemorrhoids. - The examination was otherwise normal on direct and retroflexion views. Path: 2 tubular adenoma  Previous Labs / Imaging::  Imaging:  DG Chest 2 View CLINICAL DATA:  76 year old female with cough and  COVID  EXAM: CHEST - 2 VIEW  COMPARISON:  04/06/2021  FINDINGS: Cardiomediastinal silhouette unchanged in size and contour. Double density projects over the lower mediastinum unchanged.  No pneumothorax or pleural effusion. No interlobular septal thickening.  Coarsened interstitial markings throughout, without confluent airspace disease. Overall similar appearance to the comparison.  Degenerative changes of the spine.  IMPRESSION: Chronic lung changes without evidence of acute cardiopulmonary disease  Electronically Signed   By: Corrie Mckusick D.O.   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 per pt   VAGINAL HYSTERECTOMY     Family History  Problem Relation Age of Onset   Colon cancer Neg Hx    Social History   Tobacco Use   Smoking status: Former    Packs/day: 0.10    Years: 5.00    Total pack years: 0.50    Types: Cigarettes    Quit date: 04/19/1984    Years since quitting: 37.4   Smokeless tobacco: Never   Tobacco comments:    reports smoked socially  Vaping Use   Vaping Use: Never used  Substance Use Topics   Alcohol use: Yes    Comment: occasional wine  Drug use: No   Current Outpatient Medications  Medication Sig Dispense Refill   albuterol (PROAIR HFA) 108 (90 Base) MCG/ACT inhaler INHALE 2 PUFFS EVERY 4 HOURS AS NEEDED FOR WHEEZING AND SHORTNESS OF BREATH. 8.5 g 10   budesonide-formoterol (SYMBICORT) 80-4.5 MCG/ACT inhaler INHALE 2 PUFFS FIRST THING IN THE MORNING AND 2 PUFFS 12 HOURS LATER 10.2 g 11   calcium-vitamin D (OSCAL WITH D) 500-200 MG-UNIT per tablet Take 1 tablet  by mouth daily with breakfast.     carbamazepine (CARBATROL) 200 MG 12 hr capsule Take 1 capsule (200 mg total) by mouth 2 (two) times daily. 180 capsule 3   fexofenadine (ALLEGRA) 180 MG tablet Take 180 mg by mouth every morning.     ibuprofen (ADVIL,MOTRIN) 400 MG tablet Take 400 mg by mouth every 8 (eight) hours as needed (joint pain).     losartan-hydrochlorothiazide (HYZAAR) 50-12.5 MG per tablet Take 1 tablet by mouth every morning.      melatonin 5 MG TABS Take 5 mg by mouth at bedtime. Takes '10mg'$  total HS     Multiple Vitamin (MULTIVITAMIN) tablet Take 1 tablet by mouth every morning.     pantoprazole (PROTONIX) 40 MG tablet Take 30- 60 min before your first and last meals of the day (Patient taking differently: 40 mg daily. Take 30- 60 min before your first and last meals of the day) 60 tablet 2   potassium chloride (K-DUR) 10 MEQ tablet Take 1 tablet by mouth daily. ((2 tabs on Mondays and Fridays))     Respiratory Therapy Supplies (FLUTTER) DEVI As instructed 1 each 0   Turmeric 500 MG CAPS Take 1 capsule by mouth every morning.      vitamin C (ASCORBIC ACID) 500 MG tablet Take 500 mg by mouth every morning.     Guaifenesin 1200 MG TB12 Take 1 tablet by mouth 2 (two) times daily as needed. (Patient not taking: Reported on 09/24/2021)     ipratropium-albuterol (DUONEB) 0.5-2.5 (3) MG/3ML SOLN Take 3 mLs by nebulization every 4 (four) hours as needed (Wheezing/shortness of breath ((PLAN C))). (Patient not taking: Reported on 09/24/2021) 360 mL 2   No current facility-administered medications for this visit.   Allergies  Allergen Reactions   Sulfonamide Derivatives Nausea And Vomiting    Review of Systems: All other systems reviewed and negative except where noted in HPI.    Physical Exam    Wt Readings from Last 3 Encounters:  09/24/21 153 lb 6 oz (69.6 kg)  05/05/21 153 lb (69.4 kg)  04/28/21 152 lb 9.6 oz (69.2 kg)    BP 140/84   Pulse 77   Ht '4\' 11"'$  (1.499 m)   Wt 153 lb  6 oz (69.6 kg)   BMI 30.98 kg/m  Constitutional:  Generally well appearing female in no acute distress. Psychiatric: Pleasant. Normal mood and affect. Behavior is normal. EENT: Pupils normal.  Conjunctivae are normal. No scleral icterus. Neck supple.  Cardiovascular: Normal rate, regular rhythm. No edema Pulmonary/chest: Effort normal and breath sounds normal. No wheezing, rales or rhonchi. Abdominal: Soft, nondistended, nontender. Bowel sounds active throughout. There are no masses palpable. No hepatomegaly.  Small periumbilical hernia Rectal: Deferred to colonoscopy Neurological: Alert and oriented to person place and time. Skin: Skin is warm and dry. No rashes noted.  Lucio Edward, MD   cc:  Referring Provider Tanda Rockers, MD

## 2021-09-24 NOTE — Patient Instructions (Signed)
Increase your water intake.   Follow a high fiber diet.   You have been scheduled for a colonoscopy. Please follow written instructions given to you at your visit today.  Please pick up your prep supplies at the pharmacy within the next 1-3 days. If you use inhalers (even only as needed), please bring them with you on the day of your procedure.  The [facility] providers would like to encourage you to use [facility] to communicate with providers for non-urgent requests or questions.  Due to long hold times on the telephone, sending your provider a message by [facility] may be a faster and more efficient way to get a response.  Please allow 48 business hours for a response.  Please remember that this is for non-urgent requests.   Due to recent changes in healthcare laws, you may see the results of your imaging and laboratory studies on MyChart before your provider has had a chance to review them.  We understand that in some cases there may be results that are confusing or concerning to you. Not all laboratory results come back in the same time frame and the provider may be waiting for multiple results in order to interpret others.  Please give [place] 48 hours in order for your provider to thoroughly review all the results before contacting the office for clarification of your results.   Thank you for choosing me and [facility].  Pricilla Riffle. Dagoberto Ligas., MD., [facility]  High-Fiber Eating Plan Fiber, also called dietary fiber, is a type of carbohydrate. It is found foods such as fruits, vegetables, whole grains, and beans. A high-fiber diet can have many health benefits. Your health care provider may recommend a high-fiber diet to help: Prevent constipation. Fiber can make your bowel movements more regular. Lower your cholesterol. Relieve the following conditions: Inflammation of veins in the anus (hemorrhoids). Inflammation of specific areas of the digestive tract (uncomplicated diverticulosis). A  problem of the large intestine, also called the colon, that sometimes causes pain and diarrhea (irritable bowel syndrome, or IBS). Prevent overeating as part of a weight-loss plan. Prevent heart disease, type 2 diabetes, and certain cancers. What are tips for following this plan? Reading food labels  Check the nutrition facts label on food products for the amount of dietary fiber. Choose foods that have 5 grams of fiber or more per serving. The goals for recommended daily fiber intake include: Men (age 69 or younger): 34-38 g. Men (over age 97): 28-34 g. Women (age 87 or younger): 25-28 g. Women (over age 24): 22-25 g. Your daily fiber goal is _____________ g. Shopping Choose whole fruits and vegetables instead of processed forms, such as apple juice or applesauce. Choose a wide variety of high-fiber foods such as avocados, lentils, oats, and kidney beans. Read the nutrition facts label of the foods you choose. Be aware of foods with added fiber. These foods often have high sugar and sodium amounts per serving. Cooking Use whole-grain flour for baking and cooking. Cook with brown rice instead of white rice. Meal planning Start the day with a breakfast that is high in fiber, such as a cereal that contains 5 g of fiber or more per serving. Eat breads and cereals that are made with whole-grain flour instead of refined flour or white flour. Eat brown rice, bulgur wheat, or millet instead of white rice. Use beans in place of meat in soups, salads, and pasta dishes. Be sure that half of the grains you eat each day  are whole grains. General information You can get the recommended daily intake of dietary fiber by: Eating a variety of fruits, vegetables, grains, nuts, and beans. Taking a fiber supplement if you are not able to take in enough fiber in your diet. It is better to get fiber through food than from a supplement. Gradually increase how much fiber you consume. If you increase your  intake of dietary fiber too quickly, you may have bloating, cramping, or gas. Drink plenty of water to help you digest fiber. Choose high-fiber snacks, such as berries, raw vegetables, nuts, and popcorn. What foods should I eat? Fruits Berries. Pears. Apples. Oranges. Avocado. Prunes and raisins. Dried figs. Vegetables Sweet potatoes. Spinach. Kale. Artichokes. Cabbage. Broccoli. Cauliflower. Green peas. Carrots. Squash. Grains Whole-grain breads. Multigrain cereal. Oats and oatmeal. Brown rice. Barley. Bulgur wheat. [place]. Quinoa. Bran muffins. Popcorn. Rye wafer crackers. Meats and other proteins Navy beans, kidney beans, and pinto beans. Soybeans. Split peas. Lentils. Nuts and seeds. Dairy Fiber-fortified yogurt. Beverages Fiber-fortified soy milk. Fiber-fortified orange juice. Other foods Fiber bars. The items listed above may not be a complete list of recommended foods and beverages. Contact a dietitian for more information. What foods should I avoid? Fruits Fruit juice. Cooked, strained fruit. Vegetables Fried potatoes. Canned vegetables. Well-cooked vegetables. Grains White bread. Pasta made with refined flour. White rice. Meats and other proteins Fatty cuts of meat. Fried chicken or fried fish. Dairy Milk. Yogurt. Cream cheese. Sour cream. Fats and oils Butters. Beverages Soft drinks. Other foods Cakes and pastries. The items listed above may not be a complete list of foods and beverages to avoid. Talk with your dietitian about what choices are best for you. Summary Fiber is a type of carbohydrate. It is found in foods such as fruits, vegetables, whole grains, and beans. A high-fiber diet has many benefits. It can help to prevent constipation, lower blood cholesterol, aid weight loss, and reduce your risk of heart disease, diabetes, and certain cancers. Increase your intake of fiber gradually. Increasing fiber too quickly may cause cramping, bloating, and gas. Drink  plenty of water while you increase the amount of fiber you consume. The best sources of fiber include whole fruits and vegetables, whole grains, nuts, seeds, and beans. This information is not intended to replace advice given to you by your health care provider. Make sure you discuss any questions you have with your health care provider. Document Revised: 08/09/2019 Document Reviewed: 08/09/2019 Elsevier Patient Education  [place].

## 2021-10-28 ENCOUNTER — Telehealth: Payer: Self-pay | Admitting: Gastroenterology

## 2021-10-28 MED ORDER — ONDANSETRON HCL 4 MG PO TABS
ORAL_TABLET | ORAL | 0 refills | Status: DC
Start: 2021-10-28 — End: 2022-03-17

## 2021-10-28 NOTE — Telephone Encounter (Signed)
Reviewed last procedure note and office visit note. I cannot see that you discussed a nausea medication prior to prepping for her colonoscopy. Please advise if you want patient to have zofran prior to her procedure scheduled on 11/19/21.

## 2021-10-28 NOTE — Telephone Encounter (Signed)
Prescription sent to patient's pharmacy and patient notified. Patient verbalized understanding. 

## 2021-10-28 NOTE — Telephone Encounter (Signed)
Zofran 4 mg po prior to prep doses, #2

## 2021-11-12 ENCOUNTER — Telehealth: Payer: Self-pay | Admitting: Internal Medicine

## 2021-11-12 MED ORDER — PANTOPRAZOLE SODIUM 40 MG PO TBEC: DELAYED_RELEASE_TABLET | ORAL | 2 refills | Status: DC

## 2021-11-12 NOTE — Telephone Encounter (Signed)
Called and left voicemail for patient to call office back in regards to medication refilled.

## 2021-11-12 NOTE — Telephone Encounter (Signed)
Called and spoke with patient. She was requesting a refill on her pantoprazole. I advised her that I would go ahead and send in the refill for her. She verbalized understanding. Nothing further needed at time of call.

## 2021-11-17 ENCOUNTER — Telehealth: Payer: Self-pay | Admitting: Gastroenterology

## 2021-11-17 NOTE — Telephone Encounter (Signed)
Patient has upcoming procedure.  She got stung by a bee and has been taking 25 mg of Benadryl 1-2 times a day.  She wanted to know if that was OK.  Please call patient and advise.  Thank you.

## 2021-11-17 NOTE — Telephone Encounter (Signed)
Informed patient that it is safe for her to take Benadryl daily leading up to the procedure date. Patient verbalized understanding.

## 2021-11-19 ENCOUNTER — Ambulatory Visit (AMBULATORY_SURGERY_CENTER): Payer: Medicare Other | Admitting: Gastroenterology

## 2021-11-19 ENCOUNTER — Encounter: Payer: Self-pay | Admitting: Gastroenterology

## 2021-11-19 VITALS — BP 137/68 | HR 64 | Temp 97.8°F | Resp 14 | Ht 59.0 in | Wt 153.0 lb

## 2021-11-19 DIAGNOSIS — Z86010 Personal history of colonic polyps: Secondary | ICD-10-CM

## 2021-11-19 DIAGNOSIS — Z09 Encounter for follow-up examination after completed treatment for conditions other than malignant neoplasm: Secondary | ICD-10-CM

## 2021-11-19 MED ORDER — SODIUM CHLORIDE 0.9 % IV SOLN: 500.0000 mL | Freq: Once | INTRAVENOUS | Status: DC

## 2021-11-19 NOTE — Patient Instructions (Signed)
Handouts provided on diverticulosis and hemorrhoids.   YOU HAD AN ENDOSCOPIC PROCEDURE TODAY AT [facility]:   Refer to the procedure report that was given to you for any specific questions about what was found during the examination.  If the procedure report does not answer your questions, please call your gastroenterologist to clarify.  If you requested that your care partner not be given the details of your procedure findings, then the procedure report has been included in a sealed envelope for you to review at your convenience later.  YOU SHOULD EXPECT: Some feelings of bloating in the abdomen. Passage of more gas than usual.  Walking can help get rid of the air that was put into your GI tract during the procedure and reduce the bloating. If you had a lower endoscopy (such as a colonoscopy or flexible sigmoidoscopy) you may notice spotting of blood in your stool or on the toilet paper. If you underwent a bowel prep for your procedure, you may not have a normal bowel movement for a few days.  Please Note:  You might notice some irritation and congestion in your nose or some drainage.  This is from the oxygen used during your procedure.  There is no need for concern and it should clear up in a day or so.  SYMPTOMS TO REPORT IMMEDIATELY:  Following lower endoscopy (colonoscopy or flexible sigmoidoscopy):  Excessive amounts of blood in the stool  Significant tenderness or worsening of abdominal pains  Swelling of the abdomen that is new, acute  Fever of 100F or higher  For urgent or emergent issues, a gastroenterologist can be reached at any hour by calling [phone]. Do not use MyChart messaging for urgent concerns.    DIET:  We do recommend a small meal at first, but then you may proceed to your regular diet.  Drink plenty of fluids but you should avoid alcoholic beverages for 24 hours.  ACTIVITY:  You should plan to take it easy for the rest of today and you should NOT  DRIVE or use heavy machinery until tomorrow (because of the sedation medicines used during the test).    FOLLOW UP: Our staff will call the number listed on your records the next business day following your procedure.  We will call around 7:15- 8:00 am to check on you and address any questions or concerns that you may have regarding the information given to you following your procedure. If we do not reach you, we will leave a message.  If you develop any symptoms (ie: fever, flu-like symptoms, shortness of breath, cough etc.) before then, please call [phone].  If you test positive for Covid 19 in the 2 weeks post procedure, please call and report this information to [place].    If any biopsies were taken you will be contacted by phone or by letter within the next 1-3 weeks.  Please call us at [phone] if you have not heard about the biopsies in 3 weeks.    SIGNATURES/CONFIDENTIALITY: You and/or your care partner have signed paperwork which will be entered into your electronic medical record.  These signatures attest to the fact that that the information above on your After Visit Summary has been reviewed and is understood.  Full responsibility of the confidentiality of this discharge information lies with you and/or your care-partner.

## 2021-11-19 NOTE — Progress Notes (Signed)
History & Physical  Primary Care Physician:  Tanda Rockers, MD Primary Gastroenterologist: Lucio Edward, MD  CHIEF COMPLAINT:  Personal history of colon polyps   HPI: Allison Gross is a 76 y.o. female with a personal history of adenomatous colon polyps for surveillance colonoscopy.  Incidental change in bowel habits.   Past Medical History:  Diagnosis Date   Asthma    Cancer ([place])    basil,squamous   Hypertension    Osteopenia    Seizures ([place]) 15 years ago   1982's from childbirth    Past Surgical History:  Procedure Laterality Date   CATARACT EXTRACTION  2022   CHOLECYSTECTOMY     HERNIA REPAIR     MOHS SURGERY     OOPHORECTOMY  2005   ET tube 7 1/2 was too big per pt/made pt have asthma attack. per pt   VAGINAL HYSTERECTOMY      Prior to Admission medications   Medication Sig Start Date End Date Taking? Authorizing Provider  budesonide-formoterol (SYMBICORT) 80-4.5 MCG/ACT inhaler INHALE 2 PUFFS FIRST THING IN THE MORNING AND 2 PUFFS 12 HOURS LATER 08/31/21  Yes Tanda Rockers, MD  calcium-vitamin D (OSCAL WITH D) 500-200 MG-UNIT per tablet Take 1 tablet by mouth daily with breakfast.   Yes [provider]  carbamazepine (CARBATROL) 200 MG 12 hr capsule Take 1 capsule (200 mg total) by mouth 2 (two) times daily. 04/06/21  Yes Cameron Sprang, MD  fexofenadine (ALLEGRA) 180 MG tablet Take 180 mg by mouth every morning.   Yes [provider]  Guaifenesin 1200 MG TB12 Take 1 tablet by mouth 2 (two) times daily as needed.   Yes [provider]  losartan-hydrochlorothiazide (HYZAAR) 50-12.5 MG per tablet Take 1 tablet by mouth every morning.    Yes [provider]  melatonin 5 MG TABS Take 5 mg by mouth at bedtime. Takes '10mg'$  total HS   Yes [provider]  Multiple Vitamin (MULTIVITAMIN) tablet Take 1 tablet by mouth every morning.   Yes [provider]  ondansetron (ZOFRAN) 4 MG tablet Take 1 tablet by mouth 1  hour prior to each prep dose before colonoscopy 10/28/21  Yes Ladene Artist, MD  pantoprazole (PROTONIX) 40 MG tablet Take 30- 60 min before your first and last meals of the day 11/12/21  Yes Tanda Rockers, MD  potassium chloride (K-DUR) 10 MEQ tablet Take 1 tablet by mouth daily. ((2 tabs on Mondays and Fridays)) 06/17/14  Yes [provider]  Turmeric 500 MG CAPS Take 1 capsule by mouth every morning.    Yes [provider]  vitamin C (ASCORBIC ACID) 500 MG tablet Take 500 mg by mouth every morning.   Yes [provider]  albuterol (PROAIR HFA) 108 (90 Base) MCG/ACT inhaler INHALE 2 PUFFS EVERY 4 HOURS AS NEEDED FOR WHEEZING AND SHORTNESS OF BREATH. 08/26/21   Tanda Rockers, MD  ibuprofen (ADVIL,MOTRIN) 400 MG tablet Take 400 mg by mouth every 8 (eight) hours as needed (joint pain).    [provider]  ipratropium-albuterol (DUONEB) 0.5-2.5 (3) MG/3ML SOLN Take 3 mLs by nebulization every 4 (four) hours as needed (Wheezing/shortness of breath ((PLAN C))). Patient not taking: Reported on 09/24/2021 06/24/17   Tanda Rockers, MD  Respiratory Therapy Supplies (FLUTTER) DEVI As instructed 04/26/14   Tanda Rockers, MD    Current Outpatient Medications  Medication Sig Dispense Refill   budesonide-formoterol (SYMBICORT) 80-4.5 MCG/ACT inhaler INHALE 2 PUFFS FIRST  THING IN THE MORNING AND 2 PUFFS 12 HOURS LATER 10.2 g 11   calcium-vitamin D (OSCAL WITH D) 500-200 MG-UNIT per tablet Take 1 tablet by mouth daily with breakfast.     carbamazepine (CARBATROL) 200 MG 12 hr capsule Take 1 capsule (200 mg total) by mouth 2 (two) times daily. 180 capsule 3   fexofenadine (ALLEGRA) 180 MG tablet Take 180 mg by mouth every morning.     Guaifenesin 1200 MG TB12 Take 1 tablet by mouth 2 (two) times daily as needed.     losartan-hydrochlorothiazide (HYZAAR) 50-12.5 MG per tablet Take 1 tablet by mouth every morning.      melatonin 5 MG TABS Take 5 mg by mouth at bedtime. Takes  '10mg'$  total HS     Multiple Vitamin (MULTIVITAMIN) tablet Take 1 tablet by mouth every morning.     ondansetron (ZOFRAN) 4 MG tablet Take 1 tablet by mouth 1 hour prior to each prep dose before colonoscopy 2 tablet 0   pantoprazole (PROTONIX) 40 MG tablet Take 30- 60 min before your first and last meals of the day 60 tablet 2   potassium chloride (K-DUR) 10 MEQ tablet Take 1 tablet by mouth daily. ((2 tabs on Mondays and Fridays))     Turmeric 500 MG CAPS Take 1 capsule by mouth every morning.      vitamin C (ASCORBIC ACID) 500 MG tablet Take 500 mg by mouth every morning.     albuterol (PROAIR HFA) 108 (90 Base) MCG/ACT inhaler INHALE 2 PUFFS EVERY 4 HOURS AS NEEDED FOR WHEEZING AND SHORTNESS OF BREATH. 8.5 g 10   ibuprofen (ADVIL,MOTRIN) 400 MG tablet Take 400 mg by mouth every 8 (eight) hours as needed (joint pain).     ipratropium-albuterol (DUONEB) 0.5-2.5 (3) MG/3ML SOLN Take 3 mLs by nebulization every 4 (four) hours as needed (Wheezing/shortness of breath ((PLAN C))). (Patient not taking: Reported on 09/24/2021) 360 mL 2   Respiratory Therapy Supplies (FLUTTER) DEVI As instructed 1 each 0   Current Facility-Administered Medications  Medication Dose Route Frequency Provider Last Rate Last Admin   0.9 %  sodium chloride infusion  500 mL Intravenous Once Ladene Artist, MD        Allergies as of 11/19/2021 - Review Complete 11/19/2021  Allergen Reaction Noted   Sulfonamide derivatives Nausea And Vomiting 03/24/2011    Family History  Problem Relation Age of Onset   Colon cancer Neg Hx     Social History   Socioeconomic History   Marital status: Married    Spouse name: Not on file   Number of children: Not on file   Years of education: Not on file   Highest education level: Not on file  Occupational History   Not on file  Tobacco Use   Smoking status: Former    Packs/day: 0.10    Years: 5.00    Total pack years: 0.50    Types: Cigarettes    Quit date: 04/19/1984    Years  since quitting: 37.6   Smokeless tobacco: Never   Tobacco comments:    reports smoked socially  Vaping Use   Vaping Use: Never used  Substance and Sexual Activity   Alcohol use: Yes    Comment: occasional wine   Drug use: No   Sexual activity: Not on file  Other Topics Concern   Not on file  Social History Narrative   Right handed    Lives with her husband    Social Determinants of  Health   Financial Resource Strain: Not on file  Food Insecurity: Not on file  Transportation Needs: Not on file  Physical Activity: Not on file  Stress: Not on file  Social Connections: Not on file  Intimate Partner Violence: Not on file    Review of Systems:  All systems reviewed were negative except where noted in HPI.   Physical Exam: General:  Alert, well-developed, in NAD Head:  Normocephalic and atraumatic. Eyes:  Sclera clear, no icterus.   Conjunctiva pink. Ears:  Normal auditory acuity. Mouth:  No deformity or lesions.  Neck:  Supple; no masses . Lungs:  Clear throughout to auscultation.   No wheezes, crackles, or rhonchi. No acute distress. Heart:  Regular rate and rhythm; no murmurs. Abdomen:  Soft, nondistended, nontender. No masses, hepatomegaly. No obvious masses.  Normal bowel .    Rectal:  Deferred   Msk:  Symmetrical without gross deformities.. Pulses:  Normal pulses noted. Extremities:  Without edema. Neurologic:  Alert and  oriented x4;  grossly normal neurologically. Skin:  Intact without significant lesions or rashes. Cervical Nodes:  No significant cervical adenopathy. Psych:  Alert and cooperative. Normal mood and affect.   Impression / Plan:   Personal history of adenomatous colon polyps for surveillance colonoscopy.  Pricilla Riffle. Fuller Plan  11/19/2021, 10:20 AM See Shea Evans, [facility], to contact our on call provider

## 2021-11-19 NOTE — Progress Notes (Signed)
Report to PACU, RN, vss, BBS= Clear.  

## 2021-11-19 NOTE — Op Note (Signed)
[facility] Patient Name: Allison Gross Procedure Date: 11/19/2021 10:27 AM MRN: [id number] Endoscopist: Meryl Dare , MD Age: 76 Referring MD:  Date of Birth: Jul 25, 1945 Gender: Female Account #: [id number] Procedure:                Colonoscopy Indications:              Surveillance: Personal history of adenomatous                            polyps on last colonoscopy 5 years ago Medicines:                Monitored Anesthesia Care Procedure:                Pre-Anesthesia Assessment:                           - Prior to the procedure, a History and Physical                            was performed, and patient medications and                            allergies were reviewed. The patient's tolerance of                            previous anesthesia was also reviewed. The risks                            and benefits of the procedure and the sedation                            options and risks were discussed with the patient.                            All questions were answered, and informed consent                            was obtained. Prior Anticoagulants: The patient has                            taken no previous anticoagulant or antiplatelet                            agents. ASA Grade Assessment: II - A patient with                            mild systemic disease. After reviewing the risks                            and benefits, the patient was deemed in                            satisfactory condition to undergo the procedure.  After obtaining informed consent, the colonoscope                            was passed under direct vision. Throughout the                            procedure, the patient's blood pressure, pulse, and                            oxygen saturations were monitored continuously. The                            CF [id number] #[id number] was introduced through the anus                            and advanced to the  the cecum, identified by                            appendiceal orifice and ileocecal valve. The                            ileocecal valve, appendiceal orifice, and rectum                            were photographed. The quality of the bowel                            preparation was excellent. The colonoscopy was                            performed without difficulty. The patient tolerated                            the procedure well. Scope In: 10:30:25 AM Scope Out: 10:43:27 AM Scope Withdrawal Time: 0 hours 10 minutes 30 seconds  Total Procedure Duration: 0 hours 13 minutes 2 seconds  Findings:                 The perianal and digital rectal examinations were                            normal.                           A few small-mouthed diverticula were found in the                            left colon. There was no evidence of diverticular                            bleeding.                           Internal hemorrhoids were found during  retroflexion. The hemorrhoids were small and Grade                            I (internal hemorrhoids that do not prolapse).                           The exam was otherwise without abnormality on                            direct and retroflexion views. Complications:            No immediate complications. Estimated blood loss:                            None. Estimated Blood Loss:     Estimated blood loss: none. Impression:               - Mild diverticulosis in the left colon.                           - Internal hemorrhoids.                           - The examination was otherwise normal on direct                            and retroflexion views.                           - No specimens collected. Recommendation:           - Patient has a contact number available for                            emergencies. The signs and symptoms of potential                            delayed complications were discussed  with the                            patient. Return to normal activities tomorrow.                            Written discharge instructions were provided to the                            patient.                           - Resume previous diet.                           - Continue present medications.                           - No repeat colonoscopy due to age and the absence  of colonic polyps. Meryl Dare, MD 11/19/2021 10:48:31 AM This report has been signed electronically.

## 2021-11-20 ENCOUNTER — Telehealth: Payer: Self-pay

## 2021-11-20 NOTE — Telephone Encounter (Signed)
Attempted f/u call. No answer, left VM. 

## 2022-01-11 ENCOUNTER — Telehealth: Payer: Self-pay | Admitting: Internal Medicine

## 2022-01-11 NOTE — Telephone Encounter (Signed)
Called and spoke with pt  She says her spouse was sick with virus last wk- she ended up going with him to UC and she was dx with strep  She is on amoxicillin 500 bid x 10 days  She was also started on pred 40 mg x 5 days  She is coughing with clear sputum and denies any other symptoms She states that she is taking delsym and tramadol to help with cough and asking if needs anything else   She is not taking the protonix- I advised she follow the plan outlined at lov and take this 30-60 min bid ac  She verbalized understanding  C/o diarrhea with abx- advised take with meal and glass of water, eat yogurt or take probiotic, use immodium prn   Dr Melvyn Novas- is there anything else you rec for her?  Thanks!

## 2022-01-11 NOTE — Telephone Encounter (Signed)
Patient took her husband to get check out on Friday due to him not feeling well. Patient was check too and was positive for a throat infection. Patient states she only had a small scratchy feeling in throat. Patient states on Saturday she began coughing. The urgent care gave her amoxicillin '500MG'$  and prednisone '40MG'$  1 time daily in the morning. Patient is also taking her regular medications. Patient states she is feeling about the same since starting these medications. Wanted to know if Dr. Melvyn Novas wanting to add or up these medications. Uses [facility] in [place].  Please advise- call home number back.

## 2022-01-11 NOTE — Telephone Encounter (Signed)
Sounds good - also add a large glass of water with antibiotic may help dilute the part that causes diarrhea if it has clavulinic acid in it

## 2022-01-11 NOTE — Telephone Encounter (Signed)
Spoke with the pt and notified of response per MW  She verbalized understanding  Nothing further needed

## 2022-03-02 ENCOUNTER — Telehealth: Payer: Self-pay | Admitting: Internal Medicine

## 2022-03-02 NOTE — Telephone Encounter (Signed)
Called and spoke with pt and stated to her that we are advising our patients to get the RSV vaccine and she verbalized understanding. Stated to pt that her inhalers could be discussed at upcoming [place] and she verbalized understanding. Nothing further needed.

## 2022-03-03 ENCOUNTER — Emergency Department (HOSPITAL_COMMUNITY)
Admission: EM | Admit: 2022-03-03 | Discharge: 2022-03-03 | Disposition: A | Discharge status: Home / Self Care | Payer: Medicare Other | Attending: Student | Admitting: Student

## 2022-03-03 ENCOUNTER — Emergency Department (HOSPITAL_BASED_OUTPATIENT_CLINIC_OR_DEPARTMENT_OTHER): Payer: Medicare Other

## 2022-03-03 ENCOUNTER — Other Ambulatory Visit: Payer: Self-pay

## 2022-03-03 DIAGNOSIS — L03116 Cellulitis of left lower limb: Secondary | ICD-10-CM | POA: Insufficient documentation

## 2022-03-03 DIAGNOSIS — Z85828 Personal history of other malignant neoplasm of skin: Secondary | ICD-10-CM | POA: Diagnosis not present

## 2022-03-03 DIAGNOSIS — M7989 Other specified soft tissue disorders: Secondary | ICD-10-CM | POA: Diagnosis not present

## 2022-03-03 DIAGNOSIS — L538 Other specified erythematous conditions: Secondary | ICD-10-CM | POA: Diagnosis not present

## 2022-03-03 DIAGNOSIS — I10 Essential (primary) hypertension: Secondary | ICD-10-CM | POA: Diagnosis not present

## 2022-03-03 DIAGNOSIS — R52 Pain, unspecified: Secondary | ICD-10-CM

## 2022-03-03 DIAGNOSIS — J45909 Unspecified asthma, uncomplicated: Secondary | ICD-10-CM | POA: Insufficient documentation

## 2022-03-03 DIAGNOSIS — L089 Local infection of the skin and subcutaneous tissue, unspecified: Secondary | ICD-10-CM

## 2022-03-03 DIAGNOSIS — Z79899 Other long term (current) drug therapy: Secondary | ICD-10-CM | POA: Diagnosis not present

## 2022-03-03 DIAGNOSIS — Z87891 Personal history of nicotine dependence: Secondary | ICD-10-CM | POA: Insufficient documentation

## 2022-03-03 LAB — COMPREHENSIVE METABOLIC PANEL
ALT: 16 U/L (ref 0–44)
AST: 18 U/L (ref 15–41)
Albumin: 3.9 g/dL (ref 3.5–5.0)
Alkaline Phosphatase: 88 U/L (ref 38–126)
Anion gap: 6 (ref 5–15)
BUN: 18 mg/dL (ref 8–23)
CO2: 29 mmol/L (ref 22–32)
Calcium: 9.3 mg/dL (ref 8.9–10.3)
Chloride: 96 mmol/L — ABNORMAL LOW (ref 98–111)
Creatinine, Ser: 0.46 mg/dL (ref 0.44–1.00)
GFR, Estimated: >60 mL/min (ref >60)
Glucose, Bld: 103 mg/dL — ABNORMAL HIGH (ref 70–99)
Potassium: 4.2 mmol/L (ref 3.5–5.1)
Sodium: 131 mmol/L — ABNORMAL LOW (ref 135–145)
Total Bilirubin: 0.7 mg/dL (ref 0.3–1.2)
Total Protein: 6.7 g/dL (ref 6.5–8.1)

## 2022-03-03 LAB — CBC WITH DIFFERENTIAL/PLATELET
Abs Immature Granulocytes: 0.01 10*3/uL (ref 0.00–0.07)
Basophils Absolute: 0 10*3/uL (ref 0.0–0.1)
Basophils Relative: 1 %
Eosinophils Absolute: 0.1 10*3/uL (ref 0.0–0.5)
Eosinophils Relative: 2 %
HCT: 40.4 % (ref 36.0–46.0)
Hemoglobin: 13.4 g/dL (ref 12.0–15.0)
Immature Granulocytes: 0 %
Lymphocytes Relative: 28 %
Lymphs Abs: 1.2 10*3/uL (ref 0.7–4.0)
MCH: 30.1 pg (ref 26.0–34.0)
MCHC: 33.2 g/dL (ref 30.0–36.0)
MCV: 90.8 fL (ref 80.0–100.0)
Monocytes Absolute: 0.4 10*3/uL (ref 0.1–1.0)
Monocytes Relative: 10 %
Neutro Abs: 2.5 10*3/uL (ref 1.7–7.7)
Neutrophils Relative %: 59 %
Platelets: 268 10*3/uL (ref 150–400)
RBC: 4.45 MIL/uL (ref 3.87–5.11)
RDW: 11.9 % (ref 11.5–15.5)
WBC: 4.3 10*3/uL (ref 4.0–10.5)
nRBC: 0 % (ref 0.0–0.2)

## 2022-03-03 LAB — LACTIC ACID, PLASMA: Lactic Acid, Venous: 1.2 mmol/L (ref 0.5–1.9)

## 2022-03-03 MED ORDER — DEXTROSE 5 % IV SOLN
1500.0000 mg | Freq: Once | INTRAVENOUS | Status: DC
  Filled 2022-03-03: qty 75

## 2022-03-03 MED ORDER — DEXTROSE 5 % IV SOLN
1500.0000 mg | Freq: Once | INTRAVENOUS | Status: AC
  Administered 2022-03-03: 1500 mg via INTRAVENOUS
  Filled 2022-03-03: qty 75

## 2022-03-03 NOTE — Progress Notes (Signed)
LLE venous duplex has been completed.  Preliminary results given to Grave Loeffler, PA-C.   Results can be found under chart review under CV PROC. 03/03/2022 1:16 PM Kel Senn RVT, RDMS

## 2022-03-03 NOTE — ED Provider Notes (Signed)
[facility] Provider Note  CSN: [id number] Arrival date & time: 03/03/22 4098  Chief Complaint(s) Wound Infection  HPI Laylani Plantz is a 76 y.o. female with PMH asthma, HTN who presents emergency department for evaluation of a left lower extremity wound.  Patient states that in late 2023 she struck her leg against a car door in [facility] and was seen at an urgent care where she started a course of doxycycline for suspected surrounding cellulitis.  Her symptoms did not improve and she was seen by an additional urgent care provider who started the patient on Keflex and patient has taken 10 days of Keflex without much improvement.  She arrives with left lower extremity erythema surrounding a scabbed over wound on the left lower extremity that is tender to palpation.  Denies fever, chest pain, shortness of breath, abdominal pain no nausea, vomiting or other systemic symptoms.   Past Medical History Past Medical History:  Diagnosis Date   Asthma    Cancer (HCC)    basil,squamous   Hypertension    Osteopenia    Seizures (HCC) 15 years ago   1982's from childbirth   Patient Active Problem List   Diagnosis Date Noted   Asthmatic bronchitis with acute exacerbation 04/06/2021   DOE (dyspnea on exertion) 06/13/2017   Microcytic anemia 06/13/2017   Chronic rhinitis 10/18/2012   Sinusitis, acute 05/02/2012   Asthma 03/09/2012   Personal history of other malignant neoplasm of skin 08/08/2011   Hypertension 04/24/2011   OSTEOPOROSIS 03/26/2010   CARCINOMA, BASAL CELL 10/16/2009   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 Authorizing Provider  albuterol (PROAIR HFA) 108 (90 Base) MCG/ACT inhaler INHALE 2 PUFFS EVERY 4 HOURS AS NEEDED FOR WHEEZING  AND SHORTNESS OF BREATH. 08/26/21   Nyoka Cowden, MD  budesonide-formoterol (SYMBICORT) 80-4.5 MCG/ACT inhaler INHALE 2 PUFFS FIRST THING IN THE MORNING AND 2 PUFFS 12 HOURS LATER 08/31/21   Nyoka Cowden, MD  calcium-vitamin D (OSCAL WITH D) 500-200 MG-UNIT per tablet Take 1 tablet by mouth daily with breakfast.    [provider]  carbamazepine (CARBATROL) 200 MG 12 hr capsule Take 1 capsule (200 mg total) by mouth 2 (two) times daily. 04/06/21   Van Clines, MD  fexofenadine (ALLEGRA) 180 MG tablet Take 180 mg by mouth every morning.    [provider]  Guaifenesin 1200 MG TB12 Take 1 tablet by mouth 2 (two) times daily as needed.    [provider]  ibuprofen (ADVIL,MOTRIN) 400 MG tablet Take 400 mg by mouth every 8 (eight) hours as needed (joint pain).    [provider]  ipratropium-albuterol (DUONEB) 0.5-2.5 (3) MG/3ML SOLN Take 3 mLs by nebulization every 4 (four) hours as needed (Wheezing/shortness of breath ((PLAN C))). Patient not taking: Reported on 09/24/2021 06/24/17   Nyoka Cowden, MD  losartan-hydrochlorothiazide (HYZAAR) 50-12.5 MG per tablet Take 1 tablet by mouth every morning.     [provider]  melatonin 5 MG TABS Take 5 mg by mouth at bedtime. Takes 10mg  total HS    [provider]  Multiple Vitamin (MULTIVITAMIN) tablet Take 1 tablet by mouth every morning.    [provider]  ondansetron (ZOFRAN) 4 MG tablet Take 1 tablet by mouth 1 hour prior to  each prep dose before colonoscopy 10/28/21   Meryl Dare, MD  pantoprazole (PROTONIX) 40 MG tablet Take 30- 60 min before your first and last meals of the day 11/12/21   Nyoka Cowden, MD  potassium chloride (K-DUR) 10 MEQ tablet Take 1 tablet by mouth daily. ((2 tabs on Mondays and Fridays)) 06/17/14   [provider]  Respiratory Therapy Supplies (FLUTTER) DEVI As instructed 04/26/14   Nyoka Cowden, MD  Turmeric 500 MG CAPS Take 1 capsule by mouth  every morning.     [provider]  vitamin C (ASCORBIC ACID) 500 MG tablet Take 500 mg by mouth every morning.    [provider]                                                                                                                                    Past Surgical History Past Surgical History:  Procedure Laterality Date   CATARACT EXTRACTION  2022   CHOLECYSTECTOMY     HERNIA REPAIR     MOHS SURGERY     OOPHORECTOMY  2005   ET tube 7 1/2 was too big per pt/made pt have asthma attack. per pt   VAGINAL HYSTERECTOMY     Family History Family History  Problem Relation Age of Onset   Colon cancer Neg Hx     Social History Social History   Tobacco Use   Smoking status: Former    Packs/day: 0.10    Years: 5.00    Total pack years: 0.50    Types: Cigarettes    Quit date: 04/19/1984    Years since quitting: 37.8   Smokeless tobacco: Never   Tobacco comments:    reports smoked socially  Vaping Use   Vaping Use: Never used  Substance Use Topics   Alcohol use: Yes    Comment: occasional wine   Drug use: No   Allergies Sulfonamide derivatives  Review of Systems Review of Systems  Skin:  Positive for rash and wound.    Physical Exam Vital Signs  I have reviewed the triage vital signs BP (!) 147/93   Pulse 77   Temp 97.6 F (36.4 C) (Oral)   Resp 18   Ht 4\' 11"  (1.499 m)   Wt 69 kg   SpO2 100%   BMI 30.72 kg/m   Physical Exam Vitals and nursing note reviewed.  Constitutional:      General: She is not in acute distress.    Appearance: She is well-developed.  HENT:     Head: Normocephalic and atraumatic.  Eyes:     Conjunctiva/sclera: Conjunctivae normal.  Cardiovascular:     Rate and Rhythm: Normal rate and regular rhythm.     Heart sounds: No murmur heard. Pulmonary:     Effort: Pulmonary effort is normal. No respiratory distress.     Breath sounds: Normal breath sounds.  Abdominal:  Palpations: Abdomen is soft.      Tenderness: There is no abdominal tenderness.  Musculoskeletal:        General: No swelling.     Cervical back: Neck supple.  Skin:    General: Skin is warm and dry.     Capillary Refill: Capillary refill takes less than 2 seconds.     Findings: Erythema, lesion and rash present.  Neurological:     Mental Status: She is alert.  Psychiatric:        Mood and Affect: Mood normal.     ED Results and Treatments Labs (all labs ordered are listed, but only abnormal results are displayed) Labs Reviewed  COMPREHENSIVE METABOLIC PANEL - Abnormal; Notable for the following components:      Result Value   Sodium 131 (*)    Chloride 96 (*)    Glucose, Bld 103 (*)    All other components within normal limits  CBC WITH DIFFERENTIAL/PLATELET  LACTIC ACID, PLASMA  LACTIC ACID, PLASMA                                                                                                                          Radiology VAS [place] LOWER EXTREMITY VENOUS (DVT) (ONLY [facility] & [facility])  Result Date: 03/03/2022  Lower Venous DVT Study Patient Name:  ALEYAH SALOM  Date of Exam:   03/03/2022 Medical Rec #: [id number]        Accession #:    [id number] Date of Birth: 14-Jul-1945        Patient Gender: F Patient Age:   20 years Exam Location:  [facility] Procedure:      VAS [place] LOWER EXTREMITY VENOUS (DVT) Referring Phys: GRACE LOEFFLER --------------------------------------------------------------------------------  Indications: Pain, Swelling, and Erythema. Other Indications: Non healing wound to LLE for over a month. Comparison Study: No previous exams Performing Technologist: Jody Hill RVT, RDMS  Examination Guidelines: A complete evaluation includes B-mode imaging, spectral Doppler, color Doppler, and power Doppler as needed of all accessible portions of each vessel. Bilateral testing is considered an integral part of a complete examination. Limited examinations for reoccurring indications may be performed as  noted. The reflux portion of the exam is performed with the patient in reverse Trendelenburg.  +-----+---------------+---------+-----------+----------+--------------+ RIGHTCompressibilityPhasicitySpontaneityPropertiesThrombus Aging +-----+---------------+---------+-----------+----------+--------------+ CFV  Full           Yes      Yes                                 +-----+---------------+---------+-----------+----------+--------------+   +---------+---------------+---------+-----------+----------+-------------------+ LEFT     CompressibilityPhasicitySpontaneityPropertiesThrombus Aging      +---------+---------------+---------+-----------+----------+-------------------+ CFV      Full           Yes      Yes                                      +---------+---------------+---------+-----------+----------+-------------------+  SFJ      Full                                                             +---------+---------------+---------+-----------+----------+-------------------+ FV Prox  Full           Yes      Yes                                      +---------+---------------+---------+-----------+----------+-------------------+ FV Mid   Full           Yes      Yes                                      +---------+---------------+---------+-----------+----------+-------------------+ FV DistalFull           Yes      Yes                                      +---------+---------------+---------+-----------+----------+-------------------+ PFV      Full                                                             +---------+---------------+---------+-----------+----------+-------------------+ POP      Full           Yes      Yes                                      +---------+---------------+---------+-----------+----------+-------------------+ PTV      Full                                                              +---------+---------------+---------+-----------+----------+-------------------+ PERO                                                  Not well visualized +---------+---------------+---------+-----------+----------+-------------------+    Summary: RIGHT: - No evidence of common femoral vein obstruction.  LEFT: - There is no evidence of deep vein thrombosis in the lower extremity.  - No cystic structure found in the popliteal fossa.  *See table(s) above for measurements and observations.    Preliminary     Pertinent labs & imaging results that were available during my care of the patient were reviewed by me and considered in my medical decision making (see MDM for details).  Medications Ordered in ED Medications  dalbavancin (DALVANCE) 1,500 mg in dextrose 5 % 500 mL IVPB (has no  administration in time range)                                                                                                                                     Procedures Procedures  (including critical care time)  Medical Decision Making / ED Course   This patient presents to the ED for concern of left leg wound, this involves an extensive number of treatment options, and is a complaint that carries with it a high risk of complications and morbidity.  The differential diagnosis includes cellulitis, deep space wound infection, failure of outpatient antibiotics, retained foreign body, erythema migrans  MDM: Patient seen emergency room for evaluation of left leg wound.  Physical exam with a 4 cm area of erythema surrounding a well healing, scabbed over wound that is tender to palpation consistent with cellulitis.  Laboratory evaluation is unremarkable.  Given the patient has failed 2 courses of oral outpatient antibiotics but is otherwise hemodynamically stable with a stable exam, we have shared decision making and patient qualifies for dalbavancin use here in the emergency department with outpatient infectious  disease follow-up.  I also offered admission for attritional IV antibiotics and the patient requested IV Dalvance and discharged with outpatient follow-up.  We discussed return precautions extensively and the patient receive dalbavancin without issue here in the emergency department.  Amatory referral placed to infectious disease and patient discharged with outpatient follow-up.   Additional history obtained: -Additional history obtained from husband -External records from outside source obtained and reviewed including: Chart review including previous notes, labs, imaging, consultation notes   Lab Tests: -I ordered, reviewed, and interpreted labs.   The pertinent results include:   Labs Reviewed  COMPREHENSIVE METABOLIC PANEL - Abnormal; Notable for the following components:      Result Value   Sodium 131 (*)    Chloride 96 (*)    Glucose, Bld 103 (*)    All other components within normal limits  CBC WITH DIFFERENTIAL/PLATELET  LACTIC ACID, PLASMA  LACTIC ACID, PLASMA    Medicines ordered and prescription drug management: Meds ordered this encounter  Medications   DISCONTD: dalbavancin (DALVANCE) 1,500 mg in dextrose 5 % 500 mL IVPB    Order Specific Question:   Indication:    Answer:   Cellulitis   dalbavancin (DALVANCE) 1,500 mg in dextrose 5 % 500 mL IVPB    Order Specific Question:   Indication:    Answer:   Cellulitis    -I have reviewed the patients home medicines and have made adjustments as needed  Critical interventions none   Cardiac Monitoring: The patient was maintained on a cardiac monitor.  I personally viewed and interpreted the cardiac monitored which showed an underlying rhythm of: NSR  Social Determinants of Health:  Factors impacting patients care include: none   Reevaluation: After the interventions noted above, I reevaluated the patient and found that they have :stayed the  same  Co morbidities that complicate the patient evaluation  Past Medical  History:  Diagnosis Date   Asthma    Cancer (HCC)    basil,squamous   Hypertension    Osteopenia    Seizures (HCC) 15 years ago   1982's from childbirth      Dispostion: I considered admission for this patient, but we used shared decision making and pt elected for dalbavancin and ID follow up with return precautions. Pt then dc with outpt fu and strict return precautions      Final Clinical Impression(s) / ED Diagnoses Final diagnoses:  Post-traumatic wound infection     @PCDICTATION @    Glendora Score, MD 03/03/22 1811

## 2022-03-03 NOTE — Progress Notes (Signed)
Pharmacy Note: dalbavancin (DALVANCE) for Acute Bacterial Skin and Skin Structure Infection (ABSSSI) Patients to [facility] Discharge   Allison Gross is a 76 y.o. female who presented to [facility] on 03/03/2022 with an ABSSSI.  Inclusion criteria:  Indication: Moderately large skin lesion 75 cm2 or larger (about size of a baseball)  Patient has at least one SIRS criteria present: None, prevent hospital admission.   Exclusion criteria: Patient was evaluated and negative for hardware involvement, hypotension / shock, elevated lactate > 2 without other explanation, gram-negative infection risk factors (bites, water exposure, infection after trauma, infection after skin graft, burns, severe immunocompromise), necrotizing fasciitis possible / confirmed, known or suspected osteomyelitis or septic arthritis, endocarditis, diabetic foot infection, ischemic ulcers, post-operative wound infection, perirectal infection, need for drainage in the OR, hand / facial infections, injection drug users with  fever, bacteremia, pregnancy or breastfeeding, allergy related to antibiotics like vancomycin, known liver disease ( T.Bili > 2x ULN or AST/ALT > 3x ULN).   Arushi Partridge S. Alford Highland, PharmD, BCPS Clinical Staff Pharmacist [email or web address] [place] 03/03/2022, 4:12 PM Clinical Pharmacist

## 2022-03-03 NOTE — ED Provider Triage Note (Signed)
Emergency Medicine Provider Triage Evaluation Note  Allison Gross , a 76 y.o. female  was evaluated in triage.  Pt complains of left leg swelling and redness.  States that in early October she developed a wound on her left shin that has not yet healed yet.  She said at that time she was seen by her PCP who prescribed her doxycycline for presumed infection.  She said this did not help at all, and the swelling and redness got worse.  She was then put on Keflex which she stopped taking on October 31.  She says the Keflex helped, but never got rid of the symptoms completely.  She says since she stopped the antibiotic the swelling and redness has gotten severe.  She has not had any fevers or chills.  She denies any history of blood clots.  She has no numbness or tingling in extremity.  Review of Systems  Positive:  Negative:   Physical Exam  BP (!) 175/95   Pulse 87   Temp 97.8 F (36.6 C)   Resp 20   Ht '4\' 11"'$  (1.499 m)   Wt 69 kg   SpO2 99%   BMI 30.72 kg/m  Gen:   Awake, no distress   Resp:  Normal effort  MSK:   Moves extremities without difficulty  Other:  Non healing ulcer left shin. Surrounding erythema, pitting edema, warm to touch, no crepitus. Pedal pulse 2 +.  Medical Decision Making  Medically screening exam initiated at 10:08 AM.  Appropriate orders placed.  Allison Gross was informed that the remainder of the evaluation will be completed by another provider, this initial triage assessment does not replace that evaluation, and the importance of remaining in the ED until their evaluation is complete.  Will order labs and DVT study.    Adolphus Birchwood, PA-C 03/03/22 1010

## 2022-03-03 NOTE — ED Triage Notes (Signed)
Pt reports left lower leg wound x1 month with 2 different abts and cream with no improvement. Swelling, redness, and heat noted.

## 2022-03-08 ENCOUNTER — Telehealth: Payer: Self-pay

## 2022-03-08 NOTE — Telephone Encounter (Signed)
Patient called wanting to see if she could be seen this afternoon. Patient stated that the scab on her left leg wound fell off today and the wound looks deeper. Patient is afraid of wound getting infected. Informed patient we don't have any availability this afternoon. Patient is scheduled to see Dr.Van Dam at 9 AM on 11/21. Advised patient to keep wound clean and wrapped until appointment tomorrow to be evaluated.    [place], CMA

## 2022-03-09 ENCOUNTER — Encounter: Payer: Self-pay | Admitting: Infectious Disease

## 2022-03-09 ENCOUNTER — Ambulatory Visit (INDEPENDENT_AMBULATORY_CARE_PROVIDER_SITE_OTHER): Payer: Medicare Other | Admitting: Infectious Disease

## 2022-03-09 ENCOUNTER — Other Ambulatory Visit: Payer: Self-pay

## 2022-03-09 VITALS — BP 139/84 | HR 80 | Temp 97.4°F | Ht 60.0 in | Wt 155.0 lb

## 2022-03-09 DIAGNOSIS — L0390 Cellulitis, unspecified: Secondary | ICD-10-CM | POA: Diagnosis not present

## 2022-03-09 DIAGNOSIS — R600 Localized edema: Secondary | ICD-10-CM

## 2022-03-09 HISTORY — DX: Cellulitis, unspecified: L03.90

## 2022-03-09 MED ORDER — CEFADROXIL 500 MG PO CAPS: 1000.0000 mg | ORAL_CAPSULE | Freq: Two times a day (BID) | ORAL | 0 refills | Status: AC

## 2022-03-09 NOTE — Progress Notes (Signed)
Subjective:  Reason for infectious disease consult : lower extremity wound with cellulitis  Requesting physician Glendora Score, MD   Patient ID: Allison Gross, female    DOB: 10-Dec-1945, 76 y.o.   MRN: [id number]  HPI  Allison Gross is a 76 year old Caucasian lady history of hypertension osteopenia asthma who was in [facility] when she accidentally slammed the car door on her leg.  This was in early October.  Tried to see 3 different urgent cares in the [place] area but they were all closed at the time.  She then tried another urgent care in [place].  Ultimately she seen and given antimicrobial ointment along with doxycycline.  She had worsening of the redness in her leg and swelling.  She was then given Keflex 500 mg 4 times a day and had improvement in the erythema and the appearance of the wound.  Roughly a week after coming off Keflex she had worsening of erythema and swelling of the leg and came to the ER in [facility].  Doppler was performed which failed to show evidence of DVT.  She was given a dose of dalbavancin.  She was referred to our clinic and infectious disease.  She says that the wound now is doing a bit worse but she believes because she remove the bandage this week and it tore off a scab that was forming.     Past Medical History:  Diagnosis Date   Asthma    Cancer (HCC)    basil,squamous   Hypertension    Osteopenia    Seizures (HCC) 15 years ago   1982's from childbirth    Past Surgical History:  Procedure Laterality Date   CATARACT EXTRACTION  2022   CHOLECYSTECTOMY     HERNIA REPAIR     MOHS SURGERY     OOPHORECTOMY  2005   ET tube 7 1/2 was too big per pt/made pt have asthma attack. per pt   VAGINAL HYSTERECTOMY      Family History  Problem Relation Age of Onset   Colon cancer Neg Hx       Social History   Socioeconomic History   Marital status: Married    Spouse name: Not on file   Number of children: Not on file    Years of education: Not on file   Highest education level: Not on file  Occupational History   Not on file  Tobacco Use   Smoking status: Former    Packs/day: 0.10    Years: 5.00    Total pack years: 0.50    Types: Cigarettes    Quit date: 04/19/1984    Years since quitting: 37.9   Smokeless tobacco: Never   Tobacco comments:    reports smoked socially  Vaping Use   Vaping Use: Never used  Substance and Sexual Activity   Alcohol use: Yes    Comment: occasional wine   Drug use: No   Sexual activity: Not on file  Other Topics Concern   Not on file  Social History Narrative   Right handed    Lives with her husband    Social Determinants of Health   Financial Resource Strain: Not on file  Food Insecurity: Not on file  Transportation Needs: Not on file  Physical Activity: Not on file  Stress: Not on file  Social Connections: Not on file    Allergies  Allergen Reactions   Sulfonamide Derivatives Nausea And Vomiting     Current Outpatient  Medications:    albuterol (PROAIR HFA) 108 (90 Base) MCG/ACT inhaler, INHALE 2 PUFFS EVERY 4 HOURS AS NEEDED FOR WHEEZING AND SHORTNESS OF BREATH., Disp: 8.5 g, Rfl: 10   budesonide-formoterol (SYMBICORT) 80-4.5 MCG/ACT inhaler, INHALE 2 PUFFS FIRST THING IN THE MORNING AND 2 PUFFS 12 HOURS LATER, Disp: 10.2 g, Rfl: 11   calcium-vitamin D (OSCAL WITH D) 500-200 MG-UNIT per tablet, Take 1 tablet by mouth daily with breakfast., Disp: , Rfl:    carbamazepine (CARBATROL) 200 MG 12 hr capsule, Take 1 capsule (200 mg total) by mouth 2 (two) times daily., Disp: 180 capsule, Rfl: 3   fexofenadine (ALLEGRA) 180 MG tablet, Take 180 mg by mouth every morning., Disp: , Rfl:    Guaifenesin 1200 MG TB12, Take 1 tablet by mouth 2 (two) times daily as needed., Disp: , Rfl:    ibuprofen (ADVIL,MOTRIN) 400 MG tablet, Take 400 mg by mouth every 8 (eight) hours as needed (joint pain)., Disp: , Rfl:    losartan-hydrochlorothiazide (HYZAAR) 50-12.5 MG per  tablet, Take 1 tablet by mouth every morning. , Disp: , Rfl:    melatonin 5 MG TABS, Take 5 mg by mouth at bedtime. Takes 10mg  total HS, Disp: , Rfl:    Multiple Vitamin (MULTIVITAMIN) tablet, Take 1 tablet by mouth every morning., Disp: , Rfl:    pantoprazole (PROTONIX) 40 MG tablet, Take 30- 60 min before your first and last meals of the day, Disp: 60 tablet, Rfl: 2   potassium chloride (K-DUR) 10 MEQ tablet, Take 1 tablet by mouth daily. ((2 tabs on Mondays and Fridays)), Disp: , Rfl:    Respiratory Therapy Supplies (FLUTTER) DEVI, As instructed, Disp: 1 each, Rfl: 0   Turmeric 500 MG CAPS, Take 1 capsule by mouth every morning. , Disp: , Rfl:    vitamin C (ASCORBIC ACID) 500 MG tablet, Take 500 mg by mouth every morning., Disp: , Rfl:    ipratropium-albuterol (DUONEB) 0.5-2.5 (3) MG/3ML SOLN, Take 3 mLs by nebulization every 4 (four) hours as needed (Wheezing/shortness of breath ((PLAN C))). (Patient not taking: Reported on 09/24/2021), Disp: 360 mL, Rfl: 2   ondansetron (ZOFRAN) 4 MG tablet, Take 1 tablet by mouth 1 hour prior to each prep dose before colonoscopy, Disp: 2 tablet, Rfl: 0   Review of Systems  Constitutional:  Negative for activity change, appetite change, chills, diaphoresis, fatigue, fever and unexpected weight change.  HENT:  Negative for congestion, rhinorrhea, sinus pressure, sneezing, sore throat and trouble swallowing.   Eyes:  Negative for photophobia and visual disturbance.  Respiratory:  Negative for cough, chest tightness, shortness of breath, wheezing and stridor.   Cardiovascular:  Negative for chest pain, palpitations and leg swelling.  Gastrointestinal:  Negative for abdominal distention, abdominal pain, anal bleeding, blood in stool, constipation, diarrhea, nausea and vomiting.  Genitourinary:  Negative for difficulty urinating, dysuria, flank pain and hematuria.  Musculoskeletal:  Negative for arthralgias, back pain, gait problem, joint swelling and myalgias.   Skin:  Positive for color change and wound. Negative for pallor and rash.  Neurological:  Negative for dizziness, tremors, weakness and light-headedness.  Hematological:  Negative for adenopathy. Does not bruise/bleed easily.  Psychiatric/Behavioral:  Negative for agitation, behavioral problems, confusion, decreased concentration, dysphoric mood and sleep disturbance.        Objective:   Physical Exam Constitutional:      General: She is not in acute distress.    Appearance: Normal appearance. She is well-developed. She is not ill-appearing or diaphoretic.  HENT:     Head: Normocephalic and atraumatic.     Right Ear: Hearing and external ear normal.     Left Ear: Hearing and external ear normal.     Nose: No nasal deformity or rhinorrhea.  Eyes:     General: No scleral icterus.    Conjunctiva/sclera: Conjunctivae normal.     Right eye: Right conjunctiva is not injected.     Left eye: Left conjunctiva is not injected.     Pupils: Pupils are equal, round, and reactive to light.  Neck:     Vascular: No JVD.  Cardiovascular:     Rate and Rhythm: Normal rate and regular rhythm.     Heart sounds: Normal heart sounds, S1 normal and S2 normal. No murmur heard.    No friction rub.  Abdominal:     General: Bowel sounds are normal. There is no distension.     Palpations: Abdomen is soft.     Tenderness: There is no abdominal tenderness.  Musculoskeletal:     Right shoulder: Normal.     Left shoulder: Normal.     Cervical back: Normal range of motion and neck supple.     Right hip: Normal.     Left hip: Normal.     Right knee: Normal.     Left knee: Normal.  Lymphadenopathy:     Head:     Right side of head: No submandibular, preauricular or posterior auricular adenopathy.     Left side of head: No submandibular, preauricular or posterior auricular adenopathy.     Cervical: No cervical adenopathy.     Right cervical: No superficial or deep cervical adenopathy.    Left cervical:  No superficial or deep cervical adenopathy.  Skin:    General: Skin is warm and dry.     Coloration: Skin is not pale.     Findings: No abrasion, bruising, ecchymosis, erythema, lesion or rash.     Nails: There is no clubbing.  Neurological:     General: No focal deficit present.     Mental Status: She is alert and oriented to person, place, and time.     Sensory: No sensory deficit.     Coordination: Coordination normal.     Gait: Gait normal.  Psychiatric:        Attention and Perception: She is attentive.        Mood and Affect: Mood normal.        Speech: Speech normal.        Behavior: Behavior normal. Behavior is cooperative.        Thought Content: Thought content normal.        Judgment: Judgment normal.    Left leg 03/09/2022:         Assessment & Plan:   Left leg wound with cellulitis after trauma from car door:  I am sending in a prescription for cefadroxil 500 mg 2 tablets to be taken twice daily for 2 weeks.  We have applied a nonocclusive dressing.  I have referred her to wound care.  I have scheduled for follow-up with me on 29 November.  Lower extremity edema: I suspect this is all from the trauma from the car door Doppler was negative certainly if edema persists would recommend repeating Doppler.  I spent 62 minutes with the patient including than 50% of the time in face to face counseling of the patient and her husband regarding the nature of her cellulitis, personally reviewing Doppler along with review  of medical records in preparation for the visit and during the visit and in coordination of her care.

## 2022-03-10 ENCOUNTER — Telehealth: Payer: Self-pay

## 2022-03-10 NOTE — Telephone Encounter (Signed)
Patient called wanting to know how often she should change the dressing on her leg. Discussed that official recommendations regarding wound care would need to come from the wound clinic, she has an appointment scheduled with them 12/5.  Relayed that changing the dressing daily or as it becomes soiled may be a good idea to keep track of the wound's progress. Patient verbalized understanding and has no further questions.   Beryle Flock, RN

## 2022-03-16 ENCOUNTER — Encounter (HOSPITAL_BASED_OUTPATIENT_CLINIC_OR_DEPARTMENT_OTHER): Payer: Medicare Other | Attending: Internal Medicine | Admitting: General Surgery

## 2022-03-16 DIAGNOSIS — L97822 Non-pressure chronic ulcer of other part of left lower leg with fat layer exposed: Secondary | ICD-10-CM | POA: Insufficient documentation

## 2022-03-16 DIAGNOSIS — I10 Essential (primary) hypertension: Secondary | ICD-10-CM | POA: Diagnosis not present

## 2022-03-16 NOTE — Progress Notes (Signed)
NICHOLLE, FALZON ([id number]) [id number] Page 1 of 8 Visit Report for 03/16/2022 Allergy List Details Patient Name: Date of Service: Allison Gross 03/16/2022 1:30 PM Medical Record Number: [id number] Patient Account Number: [id number] Date of Birth/Sex: Treating RN: 12-04-1945 (76 y.o. Donalda Ewings Primary Care Konstantinos Cordoba: Tanda Rockers Other Clinician: Referring Davena Julian: Treating Kymari Lollis/Extender: Forest Becker Weeks in Treatment: 0 Allergies Active Allergies Sulfa (Sulfonamide Antibiotics) Allergy Notes Electronic Signature(s) Signed: 03/16/2022 5:04:15 PM By: Sharyn Creamer RN, BSN Entered By: Sharyn Creamer on 03/16/2022 13:58:26 -------------------------------------------------------------------------------- Arrival Information Details Patient Name: Date of Service: Allison Gross 03/16/2022 1:30 PM Medical Record Number: [id number] Patient Account Number: [id number] Date of Birth/Sex: Treating RN: [birth year] (76 y.o. Donalda Ewings Primary Care Madison Albea: Tanda Rockers Other Clinician: Referring Kindal Ponti: Treating Geovanny Sartin/Extender: Lilia Argue in Treatment: 0 Visit Information Patient Arrived: Ambulatory Arrival Time: 13:45 Accompanied By: husband Transfer Assistance: None Patient Identification Verified: Yes Secondary Verification Process Completed: Yes Patient Requires Transmission-Based Precautions: No Patient Has Alerts: No Electronic Signature(s) Signed: 03/16/2022 5:04:15 PM By: Sharyn Creamer RN, BSN Entered By: Sharyn Creamer on 03/16/2022 13:55:26 -------------------------------------------------------------------------------- Clinic Level of Care Assessment Details Patient Name: Date of Service: Allison Gross 03/16/2022 1:30 PM Medical Record Number: [id number] Patient Account Number: [id number] PRICSILLA, LINDVALL ([id number])  [id number] Page 2 of 8 Date of Birth/Sex: Treating RN: 1945-12-24 (76 y.o. Donalda Ewings Primary Care Zaahir Pickney: Other Clinician: Tanda Rockers Referring Sharron Petruska: Treating Oday Ridings/Extender: Forest Becker Weeks in Treatment: 0 Clinic Level of Care Assessment Items TOOL 1 Quantity Score X- 1 0 Use when EandM and Procedure is performed on INITIAL visit ASSESSMENTS - Nursing Assessment / Reassessment X- 1 20 General Physical Exam (combine w/ comprehensive assessment (listed just below) when performed on new pt. evals) X- 1 25 Comprehensive Assessment (HX, ROS, Risk Assessments, Wounds Hx, etc.) ASSESSMENTS - Wound and Skin Assessment / Reassessment X- 1 10 Dermatologic / Skin Assessment (not related to wound area) ASSESSMENTS - Ostomy and/or Continence Assessment and Care '[]'$  - 0 Incontinence Assessment and Management '[]'$  - 0 Ostomy Care Assessment and Management (repouching, etc.) PROCESS - Coordination of Care X - Simple Patient / Family Education for ongoing care 1 15 '[]'$  - 0 Complex (extensive) Patient / Family Education for ongoing care X- 1 10 Staff obtains Programmer, systems, Records, T Results / Process Orders est '[]'$  - 0 Staff telephones HHA, Nursing Homes / Clarify orders / etc '[]'$  - 0 Routine Transfer to another Facility (non-emergent condition) '[]'$  - 0 Routine Hospital Admission (non-emergent condition) X- 1 15 New Admissions / Biomedical engineer / Ordering NPWT Apligraf, etc. , '[]'$  - 0 Emergency Hospital Admission (emergent condition) PROCESS - Special Needs '[]'$  - 0 Pediatric / Minor Patient Management '[]'$  - 0 Isolation Patient Management '[]'$  - 0 Hearing / Language / Visual special needs '[]'$  - 0 Assessment of Community assistance (transportation, D/C planning, etc.) '[]'$  - 0 Additional assistance / Altered mentation '[]'$  - 0 Support Surface(s) Assessment (bed, cushion, seat, etc.) INTERVENTIONS - Miscellaneous '[]'$  -  0 External ear exam '[]'$  - 0 Patient Transfer (multiple staff / Civil Service fast streamer / Similar devices) '[]'$  - 0 Simple Staple / Suture removal (25 or less) '[]'$  - 0 Complex Staple / Suture removal (26 or more) '[]'$  - 0 Hypo/Hyperglycemic Management (do not check if billed separately) X- 1 15 Ankle / Brachial Index (ABI) - do not check if billed separately Has the patient been seen  NICHOLLE, FALZON ([id number]) [id number] Page 1 of 8 Visit Report for 03/16/2022 Allergy List Details Patient Name: Date of Service: Allison Gross 03/16/2022 1:30 PM Medical Record Number: [id number] Patient Account Number: [id number] Date of Birth/Sex: Treating RN: 12-04-1945 (76 y.o. Donalda Ewings Primary Care Konstantinos Cordoba: Tanda Rockers Other Clinician: Referring Davena Julian: Treating Kymari Lollis/Extender: Forest Becker Weeks in Treatment: 0 Allergies Active Allergies Sulfa (Sulfonamide Antibiotics) Allergy Notes Electronic Signature(s) Signed: 03/16/2022 5:04:15 PM By: Sharyn Creamer RN, BSN Entered By: Sharyn Creamer on 03/16/2022 13:58:26 -------------------------------------------------------------------------------- Arrival Information Details Patient Name: Date of Service: Allison Gross 03/16/2022 1:30 PM Medical Record Number: [id number] Patient Account Number: [id number] Date of Birth/Sex: Treating RN: [birth year] (76 y.o. Donalda Ewings Primary Care Madison Albea: Tanda Rockers Other Clinician: Referring Kindal Ponti: Treating Geovanny Sartin/Extender: Lilia Argue in Treatment: 0 Visit Information Patient Arrived: Ambulatory Arrival Time: 13:45 Accompanied By: husband Transfer Assistance: None Patient Identification Verified: Yes Secondary Verification Process Completed: Yes Patient Requires Transmission-Based Precautions: No Patient Has Alerts: No Electronic Signature(s) Signed: 03/16/2022 5:04:15 PM By: Sharyn Creamer RN, BSN Entered By: Sharyn Creamer on 03/16/2022 13:55:26 -------------------------------------------------------------------------------- Clinic Level of Care Assessment Details Patient Name: Date of Service: Allison Gross 03/16/2022 1:30 PM Medical Record Number: [id number] Patient Account Number: [id number] PRICSILLA, LINDVALL ([id number])  [id number] Page 2 of 8 Date of Birth/Sex: Treating RN: 1945-12-24 (76 y.o. Donalda Ewings Primary Care Zaahir Pickney: Other Clinician: Tanda Rockers Referring Sharron Petruska: Treating Oday Ridings/Extender: Forest Becker Weeks in Treatment: 0 Clinic Level of Care Assessment Items TOOL 1 Quantity Score X- 1 0 Use when EandM and Procedure is performed on INITIAL visit ASSESSMENTS - Nursing Assessment / Reassessment X- 1 20 General Physical Exam (combine w/ comprehensive assessment (listed just below) when performed on new pt. evals) X- 1 25 Comprehensive Assessment (HX, ROS, Risk Assessments, Wounds Hx, etc.) ASSESSMENTS - Wound and Skin Assessment / Reassessment X- 1 10 Dermatologic / Skin Assessment (not related to wound area) ASSESSMENTS - Ostomy and/or Continence Assessment and Care '[]'$  - 0 Incontinence Assessment and Management '[]'$  - 0 Ostomy Care Assessment and Management (repouching, etc.) PROCESS - Coordination of Care X - Simple Patient / Family Education for ongoing care 1 15 '[]'$  - 0 Complex (extensive) Patient / Family Education for ongoing care X- 1 10 Staff obtains Programmer, systems, Records, T Results / Process Orders est '[]'$  - 0 Staff telephones HHA, Nursing Homes / Clarify orders / etc '[]'$  - 0 Routine Transfer to another Facility (non-emergent condition) '[]'$  - 0 Routine Hospital Admission (non-emergent condition) X- 1 15 New Admissions / Biomedical engineer / Ordering NPWT Apligraf, etc. , '[]'$  - 0 Emergency Hospital Admission (emergent condition) PROCESS - Special Needs '[]'$  - 0 Pediatric / Minor Patient Management '[]'$  - 0 Isolation Patient Management '[]'$  - 0 Hearing / Language / Visual special needs '[]'$  - 0 Assessment of Community assistance (transportation, D/C planning, etc.) '[]'$  - 0 Additional assistance / Altered mentation '[]'$  - 0 Support Surface(s) Assessment (bed, cushion, seat, etc.) INTERVENTIONS - Miscellaneous '[]'$  -  0 External ear exam '[]'$  - 0 Patient Transfer (multiple staff / Civil Service fast streamer / Similar devices) '[]'$  - 0 Simple Staple / Suture removal (25 or less) '[]'$  - 0 Complex Staple / Suture removal (26 or more) '[]'$  - 0 Hypo/Hyperglycemic Management (do not check if billed separately) X- 1 15 Ankle / Brachial Index (ABI) - do not check if billed separately Has the patient been seen  at the hospital within the last three years: Yes Total Score: 110 Level Of Care: New/Established - Level 3 Electronic Signature(s) Signed: 03/16/2022 5:04:15 PM By: Sharyn Creamer RN, BSN Entered By: Sharyn Creamer on 03/16/2022 16:19:55 Greenley, Alexander Bergeron ([id number]) [id number] Page 3 of 8 -------------------------------------------------------------------------------- Encounter Discharge Information Details Patient Name: Date of Service: Allison Gross 03/16/2022 1:30 PM Medical Record Number: [id number] Patient Account Number: [id number] Date of Birth/Sex: Treating RN: [birth year] (76 y.o. Donalda Ewings Primary Care Kaiyden Simkin: Tanda Rockers Other Clinician: Referring Niquan Charnley: Treating Staley Budzinski/Extender: Forest Becker Weeks in Treatment: 0 Encounter Discharge Information Items Post Procedure Vitals Discharge Condition: Stable Temperature (F): 97.9 Ambulatory Status: Ambulatory Pulse (bpm): 78 Discharge Destination: Home Respiratory Rate (breaths/min): 18 Transportation: Private Auto Blood Pressure (mmHg): 132/84 Accompanied By: husband Schedule Follow-up Appointment: Yes Clinical Summary of Care: Patient Declined Electronic Signature(s) Signed: 03/16/2022 5:04:15 PM By: Sharyn Creamer RN, BSN Entered By: Sharyn Creamer on 03/16/2022 16:21:14 -------------------------------------------------------------------------------- Lower Extremity Assessment Details Patient Name: Date of Service: Allison Gross 03/16/2022 1:30 PM Medical Record Number:  [id number] Patient Account Number: [id number] Date of Birth/Sex: Treating RN: Feb 09, 1946 (76 y.o. Donalda Ewings Primary Care Tiphani Mells: Tanda Rockers Other Clinician: Referring Laquia Rosano: Treating Lanyla Costello/Extender: Forest Becker Weeks in Treatment: 0 Edema Assessment Assessed: [Left: Yes] Patrice Paradise: No] Edema: [Left: Ye] [Right: s] Calf Left: Right: Point of Measurement: 30 cm From Medial Instep 36.5 cm Ankle Left: Right: Point of Measurement: 10 cm From Medial Instep 23.5 cm Knee To Floor Left: Right: From Medial Instep 41 cm Vascular Assessment Pulses: Dorsalis Pedis Palpable: [Left:Yes] Blood Pressure: Brachial: [Left:132] Ankle: [Left:Dorsalis Pedis: 160 1.21] Electronic Signature(s) Signed: 03/16/2022 5:04:15 PM By: Sharyn Creamer RN, BSN Entered By: Sharyn Creamer on 03/16/2022 14:11:59 Kaylor, Abi ([id number]) [id number] Page 4 of 8 -------------------------------------------------------------------------------- Multi Wound Chart Details Patient Name: Date of Service: Allison Gross 03/16/2022 1:30 PM Medical Record Number: [id number] Patient Account Number: [id number] Date of Birth/Sex: Treating RN: 09/14/1945 (76 y.o. F) Primary Care Haasini Patnaude: Tanda Rockers Other Clinician: Referring Rebbeca Sheperd: Treating Roberta Angell/Extender: Forest Becker Weeks in Treatment: 0 Vital Signs Height(in): 60 Pulse(bpm): 78 Weight(lbs): 154 Blood Pressure(mmHg): 132/84 Body Mass Index(BMI): 30.1 Temperature(F): 97.9 Respiratory Rate(breaths/min): 18 [1:Photos:] [N/A:N/A] Left, Lateral Lower Leg N/A N/A Wound Location: Trauma N/A N/A Wounding Event: Trauma, Other N/A N/A Primary Etiology: Asthma, Hypertension, Seizure N/A N/A Comorbid History: Disorder 01/23/2022 N/A N/A Date Acquired: 0 N/A N/A Weeks of Treatment: Open N/A N/A Wound Status: No N/A N/A Wound Recurrence: 3x2.3x0.3 N/A  N/A Measurements L x W x D (cm) 5.419 N/A N/A A (cm) : rea 1.626 N/A N/A Volume (cm) : Full Thickness Without Exposed N/A N/A Classification: Support Structures Medium N/A N/A Exudate A mount: Serosanguineous N/A N/A Exudate Type: red, brown N/A N/A Exudate Color: Large (67-100%) N/A N/A Granulation A mount: Red N/A N/A Granulation Quality: Small (1-33%) N/A N/A Necrotic A mount: Fat Layer (Subcutaneous Tissue): Yes N/A N/A Exposed Structures: Fascia: No Tendon: No Muscle: No Joint: No Bone: No None N/A N/A Epithelialization: Debridement - Excisional N/A N/A Debridement: Pre-procedure Verification/Time Out 14:25 N/A N/A Taken: Lidocaine 5% topical ointment N/A N/A Pain Control: Subcutaneous, Slough N/A N/A Tissue Debrided: Skin/Subcutaneous Tissue N/A N/A Level: 6.9 N/A N/A Debridement A (sq cm): rea Curette N/A N/A Instrument: Minimum N/A N/A Bleeding: Pressure N/A N/A Hemostasis A chieved: 0 N/A N/A Procedural Pain: 0 N/A N/A Post Procedural Pain: Procedure was tolerated well  at the hospital within the last three years: Yes Total Score: 110 Level Of Care: New/Established - Level 3 Electronic Signature(s) Signed: 03/16/2022 5:04:15 PM By: Sharyn Creamer RN, BSN Entered By: Sharyn Creamer on 03/16/2022 16:19:55 Greenley, Alexander Bergeron ([id number]) [id number] Page 3 of 8 -------------------------------------------------------------------------------- Encounter Discharge Information Details Patient Name: Date of Service: Allison Gross 03/16/2022 1:30 PM Medical Record Number: [id number] Patient Account Number: [id number] Date of Birth/Sex: Treating RN: [birth year] (76 y.o. Donalda Ewings Primary Care Kaiyden Simkin: Tanda Rockers Other Clinician: Referring Niquan Charnley: Treating Staley Budzinski/Extender: Forest Becker Weeks in Treatment: 0 Encounter Discharge Information Items Post Procedure Vitals Discharge Condition: Stable Temperature (F): 97.9 Ambulatory Status: Ambulatory Pulse (bpm): 78 Discharge Destination: Home Respiratory Rate (breaths/min): 18 Transportation: Private Auto Blood Pressure (mmHg): 132/84 Accompanied By: husband Schedule Follow-up Appointment: Yes Clinical Summary of Care: Patient Declined Electronic Signature(s) Signed: 03/16/2022 5:04:15 PM By: Sharyn Creamer RN, BSN Entered By: Sharyn Creamer on 03/16/2022 16:21:14 -------------------------------------------------------------------------------- Lower Extremity Assessment Details Patient Name: Date of Service: Allison Gross 03/16/2022 1:30 PM Medical Record Number:  [id number] Patient Account Number: [id number] Date of Birth/Sex: Treating RN: Feb 09, 1946 (76 y.o. Donalda Ewings Primary Care Tiphani Mells: Tanda Rockers Other Clinician: Referring Laquia Rosano: Treating Lanyla Costello/Extender: Forest Becker Weeks in Treatment: 0 Edema Assessment Assessed: [Left: Yes] Patrice Paradise: No] Edema: [Left: Ye] [Right: s] Calf Left: Right: Point of Measurement: 30 cm From Medial Instep 36.5 cm Ankle Left: Right: Point of Measurement: 10 cm From Medial Instep 23.5 cm Knee To Floor Left: Right: From Medial Instep 41 cm Vascular Assessment Pulses: Dorsalis Pedis Palpable: [Left:Yes] Blood Pressure: Brachial: [Left:132] Ankle: [Left:Dorsalis Pedis: 160 1.21] Electronic Signature(s) Signed: 03/16/2022 5:04:15 PM By: Sharyn Creamer RN, BSN Entered By: Sharyn Creamer on 03/16/2022 14:11:59 Kaylor, Abi ([id number]) [id number] Page 4 of 8 -------------------------------------------------------------------------------- Multi Wound Chart Details Patient Name: Date of Service: Allison Gross 03/16/2022 1:30 PM Medical Record Number: [id number] Patient Account Number: [id number] Date of Birth/Sex: Treating RN: 09/14/1945 (76 y.o. F) Primary Care Haasini Patnaude: Tanda Rockers Other Clinician: Referring Rebbeca Sheperd: Treating Roberta Angell/Extender: Forest Becker Weeks in Treatment: 0 Vital Signs Height(in): 60 Pulse(bpm): 78 Weight(lbs): 154 Blood Pressure(mmHg): 132/84 Body Mass Index(BMI): 30.1 Temperature(F): 97.9 Respiratory Rate(breaths/min): 18 [1:Photos:] [N/A:N/A] Left, Lateral Lower Leg N/A N/A Wound Location: Trauma N/A N/A Wounding Event: Trauma, Other N/A N/A Primary Etiology: Asthma, Hypertension, Seizure N/A N/A Comorbid History: Disorder 01/23/2022 N/A N/A Date Acquired: 0 N/A N/A Weeks of Treatment: Open N/A N/A Wound Status: No N/A N/A Wound Recurrence: 3x2.3x0.3 N/A  N/A Measurements L x W x D (cm) 5.419 N/A N/A A (cm) : rea 1.626 N/A N/A Volume (cm) : Full Thickness Without Exposed N/A N/A Classification: Support Structures Medium N/A N/A Exudate A mount: Serosanguineous N/A N/A Exudate Type: red, brown N/A N/A Exudate Color: Large (67-100%) N/A N/A Granulation A mount: Red N/A N/A Granulation Quality: Small (1-33%) N/A N/A Necrotic A mount: Fat Layer (Subcutaneous Tissue): Yes N/A N/A Exposed Structures: Fascia: No Tendon: No Muscle: No Joint: No Bone: No None N/A N/A Epithelialization: Debridement - Excisional N/A N/A Debridement: Pre-procedure Verification/Time Out 14:25 N/A N/A Taken: Lidocaine 5% topical ointment N/A N/A Pain Control: Subcutaneous, Slough N/A N/A Tissue Debrided: Skin/Subcutaneous Tissue N/A N/A Level: 6.9 N/A N/A Debridement A (sq cm): rea Curette N/A N/A Instrument: Minimum N/A N/A Bleeding: Pressure N/A N/A Hemostasis A chieved: 0 N/A N/A Procedural Pain: 0 N/A N/A Post Procedural Pain: Procedure was tolerated well

## 2022-03-16 NOTE — Progress Notes (Addendum)
Date of Service: Allison Gross 03/16/2022 1:30 PM Medical Record Number: [id number] Patient Account Number: [id number] Date of Birth/Sex: Treating RN: May 02, 1945 (76 y.o. F) Primary Care Provider: Tanda Rockers Other Clinician: Referring Provider: Treating Provider/Extender: Berniece Salines in Treatment: 0 Constitutional . . . . No acute distress. Respiratory Normal work of breathing on room air.. Notes 03/16/2022: On her left lower leg, there is a wound just lateral to the anterior tibial surface. There is some rubbery slough accumulation. She has 2+ pitting edema. No purulent drainage or malodor. Electronic Signature(s) Signed: 03/16/2022 2:42:04 PM By: Fredirick Maudlin MD FACS [place], Allison Gross ([id number]) PM By: Fredirick Maudlin MD FACS [phone].pdf Page 3 of 9 Signed: 03/16/2022 2:42:04 Entered By: Fredirick Maudlin on 03/16/2022 14:42:04 -------------------------------------------------------------------------------- Physician Orders Details Patient Name: Date of Service: Allison Gross 03/16/2022 1:30 PM Medical Record Number: [id number] Patient Account Number: [id number] Date of Birth/Sex: Treating RN: 1945-11-11 (76 y.o. Allison Gross Primary Care Provider: Tanda Rockers Other Clinician: Referring Provider: Treating Provider/Extender: Berniece Salines in Treatment:  0 Verbal / Phone Orders: No Diagnosis Coding ICD-10 Coding Code Description [phone] Non-pressure chronic ulcer of other part of left lower leg with fat layer exposed I10 Essential (primary) hypertension Follow-up Appointments ppointment in 1 week. - Dr. Celine Gross - Room 2 Return A Anesthetic Wound #1 Left,Lateral Lower Leg (In clinic) Topical Lidocaine 5% applied to wound bed Bathing/ Shower/ Hygiene May shower with protection but do not get wound dressing(s) wet. - purchase cast protector from [facility], [facility] or [employer] Edema Control - Lymphedema / SCD / Other Left Lower Extremity Elevate legs to the level of the heart or above for 30 minutes daily and/or when sitting, a frequency of: Avoid standing for long periods of time. Wound Treatment Wound #1 - Lower Leg Wound Laterality: Left, Lateral Cleanser: Soap and Water 1 x Per Week/30 Days Discharge Instructions: May shower and wash wound with dial antibacterial soap and water prior to dressing change. Peri-Wound Care: Sween Lotion (Moisturizing lotion) 1 x Per Week/30 Days Discharge Instructions: Apply moisturizing lotion as directed Prim Dressing: KerraCel Ag Gelling Fiber Dressing, 2x2 in (silver alginate) 1 x Per Week/30 Days ary Discharge Instructions: Apply silver alginate to wound bed as instructed Secondary Dressing: ABD Pad, 5x9 1 x Per Week/30 Days Discharge Instructions: Apply over primary dressing as directed. Compression Wrap: ThreePress (3 layer compression wrap) 1 x Per Week/30 Days Discharge Instructions: Apply three layer compression as directed. Patient Medications llergies: Sulfa (Sulfonamide Antibiotics) A Notifications Medication Indication Start End Prior to debridement 03/16/2022 lidocaine DOSE topical 5 % ointment - ointment topical once daily Electronic Signature(s) Signed: 03/16/2022 4:01:47 PM By: Fredirick Maudlin MD FACS Entered By: Fredirick Maudlin on 03/16/2022 14:42:25 Allison Gross ([id number])  [id number] Page 4 of 9 -------------------------------------------------------------------------------- Problem List Details Patient Name: Date of Service: Allison Gross 03/16/2022 1:30 PM Medical Record Number: [id number] Patient Account Number: [id number] Date of Birth/Sex: Treating RN: 1945-06-30 (76 y.o. F) Primary Care Provider: Tanda Rockers Other Clinician: Referring Provider: Treating Provider/Extender: Berniece Salines in Treatment: 0 Active Problems ICD-10 Encounter Code Description Active Date MDM Diagnosis [phone] Non-pressure chronic ulcer of other part of left lower leg with fat layer 03/16/2022 No Yes exposed Rawson (primary) hypertension 03/16/2022 No Yes Inactive Problems Resolved Problems Electronic Signature(s) Signed: 03/16/2022 2:40:33 PM By: Fredirick Maudlin MD FACS Previous Signature: 03/16/2022 1:40:01 PM Version By: Fredirick Maudlin MD FACS Entered  Date of Service: Allison Gross 03/16/2022 1:30 PM Medical Record Number: [id number] Patient Account Number: [id number] Date of Birth/Sex: Treating RN: May 02, 1945 (76 y.o. F) Primary Care Provider: Tanda Rockers Other Clinician: Referring Provider: Treating Provider/Extender: Berniece Salines in Treatment: 0 Constitutional . . . . No acute distress. Respiratory Normal work of breathing on room air.. Notes 03/16/2022: On her left lower leg, there is a wound just lateral to the anterior tibial surface. There is some rubbery slough accumulation. She has 2+ pitting edema. No purulent drainage or malodor. Electronic Signature(s) Signed: 03/16/2022 2:42:04 PM By: Fredirick Maudlin MD FACS [place], Allison Gross ([id number]) PM By: Fredirick Maudlin MD FACS [phone].pdf Page 3 of 9 Signed: 03/16/2022 2:42:04 Entered By: Fredirick Maudlin on 03/16/2022 14:42:04 -------------------------------------------------------------------------------- Physician Orders Details Patient Name: Date of Service: Allison Gross 03/16/2022 1:30 PM Medical Record Number: [id number] Patient Account Number: [id number] Date of Birth/Sex: Treating RN: 1945-11-11 (76 y.o. Allison Gross Primary Care Provider: Tanda Rockers Other Clinician: Referring Provider: Treating Provider/Extender: Berniece Salines in Treatment:  0 Verbal / Phone Orders: No Diagnosis Coding ICD-10 Coding Code Description [phone] Non-pressure chronic ulcer of other part of left lower leg with fat layer exposed I10 Essential (primary) hypertension Follow-up Appointments ppointment in 1 week. - Dr. Celine Gross - Room 2 Return A Anesthetic Wound #1 Left,Lateral Lower Leg (In clinic) Topical Lidocaine 5% applied to wound bed Bathing/ Shower/ Hygiene May shower with protection but do not get wound dressing(s) wet. - purchase cast protector from [facility], [facility] or [employer] Edema Control - Lymphedema / SCD / Other Left Lower Extremity Elevate legs to the level of the heart or above for 30 minutes daily and/or when sitting, a frequency of: Avoid standing for long periods of time. Wound Treatment Wound #1 - Lower Leg Wound Laterality: Left, Lateral Cleanser: Soap and Water 1 x Per Week/30 Days Discharge Instructions: May shower and wash wound with dial antibacterial soap and water prior to dressing change. Peri-Wound Care: Sween Lotion (Moisturizing lotion) 1 x Per Week/30 Days Discharge Instructions: Apply moisturizing lotion as directed Prim Dressing: KerraCel Ag Gelling Fiber Dressing, 2x2 in (silver alginate) 1 x Per Week/30 Days ary Discharge Instructions: Apply silver alginate to wound bed as instructed Secondary Dressing: ABD Pad, 5x9 1 x Per Week/30 Days Discharge Instructions: Apply over primary dressing as directed. Compression Wrap: ThreePress (3 layer compression wrap) 1 x Per Week/30 Days Discharge Instructions: Apply three layer compression as directed. Patient Medications llergies: Sulfa (Sulfonamide Antibiotics) A Notifications Medication Indication Start End Prior to debridement 03/16/2022 lidocaine DOSE topical 5 % ointment - ointment topical once daily Electronic Signature(s) Signed: 03/16/2022 4:01:47 PM By: Fredirick Maudlin MD FACS Entered By: Fredirick Maudlin on 03/16/2022 14:42:25 Allison Gross ([id number])  [id number] Page 4 of 9 -------------------------------------------------------------------------------- Problem List Details Patient Name: Date of Service: Allison Gross 03/16/2022 1:30 PM Medical Record Number: [id number] Patient Account Number: [id number] Date of Birth/Sex: Treating RN: 1945-06-30 (76 y.o. F) Primary Care Provider: Tanda Rockers Other Clinician: Referring Provider: Treating Provider/Extender: Berniece Salines in Treatment: 0 Active Problems ICD-10 Encounter Code Description Active Date MDM Diagnosis [phone] Non-pressure chronic ulcer of other part of left lower leg with fat layer 03/16/2022 No Yes exposed Rawson (primary) hypertension 03/16/2022 No Yes Inactive Problems Resolved Problems Electronic Signature(s) Signed: 03/16/2022 2:40:33 PM By: Fredirick Maudlin MD FACS Previous Signature: 03/16/2022 1:40:01 PM Version By: Fredirick Maudlin MD FACS Entered  She should complete the course of oral antibiotics prescribed by infectious disease. She was instructed to elevate her leg is much as possible throughout the day and at night while she is sleeping. Follow-up in 1 week. Electronic Signature(s) Signed: 04/06/2022 11:59:57 AM By: Fredirick Maudlin MD FACS Previous Signature: 03/16/2022 2:46:31 PM Version By: Fredirick Maudlin MD FACS Previous Signature: 03/16/2022 2:44:15 PM Version By: Fredirick Maudlin MD FACS Entered By: Fredirick Maudlin on 04/06/2022 11:59:57 -------------------------------------------------------------------------------- HxROS Details Patient  Name: Date of Service: Allison Gross 03/16/2022 1:30 PM Medical Record Number: [id number] Patient Account Number: [id number] Date of Birth/Sex: Treating RN: 1945-06-13 (76 y.o. 56 Allison Gross., [place], [place] ([id number]) [id number] Page 7 of 9 Primary Care Provider: Tanda Rockers Other Clinician: Referring Provider: Treating Provider/Extender: Berniece Salines in Treatment: 0 Information Obtained From Patient Constitutional Symptoms (General Health) Complaints and Symptoms: Negative for: Fatigue; Fever; Chills; Marked Weight Change Eyes Complaints and Symptoms: Negative for: Dry Eyes; Vision Changes; Glasses / Contacts Ear/Nose/Mouth/Throat Complaints and Symptoms: Negative for: Chronic sinus problems or rhinitis Respiratory Complaints and Symptoms: Positive for: Chronic or frequent coughs Medical History: Positive for: Asthma Gastrointestinal Complaints and Symptoms: Negative for: Frequent diarrhea; Nausea; Vomiting Medical History: Past Medical History Notes: reflux Endocrine Complaints and Symptoms: Negative for: Heat/cold intolerance Genitourinary Complaints and Symptoms: Negative for: Frequent urination Integumentary (Skin) Complaints and Symptoms: Positive for: Wounds - left lower leg Musculoskeletal Complaints and Symptoms: Negative for: Muscle Pain; Muscle Weakness Neurologic Complaints and Symptoms: Negative for: Numbness/parasthesias Medical History: Positive for: Seizure Disorder - petit mal seizures Psychiatric Complaints and Symptoms: Negative for: Claustrophobia Hematologic/Lymphatic Cardiovascular Medical History: Positive for: Hypertension Hepp, Allison Gross ([id number]) [id number] Page 8 of 9 Immunological Immunizations Pneumococcal Vaccine: Received Pneumococcal Vaccination: Yes Received Pneumococcal Vaccination On or After 60th Birthday:  Yes Implantable Devices None Hospitalization / Surgery History Type of Hospitalization/Surgery cataract extraction 2022 oophorectomy 2005 cholecystectomy hernia repair mohs surgery hysterectomy Family and Social History Cancer: Yes - Maternal Grandparents,Paternal Grandparents; Diabetes: Yes; Heart Disease: Yes - Maternal Grandparents; Hypertension: Yes; Seizures: No; Stroke: No; Thyroid Problems: No; Tuberculosis: No; Never smoker; Marital Status - Married; Alcohol Use: Rarely; Drug Use: No History; Caffeine Use: Daily; Financial Concerns: No; Food, Clothing or Shelter Needs: No; Support System Lacking: No; Transportation Concerns: No Engineer, maintenance) Signed: 03/16/2022 5:04:15 PM By: Sharyn Creamer RN, BSN Signed: 03/17/2022 7:46:49 AM By: Fredirick Maudlin MD FACS Previous Signature: 03/16/2022 4:01:47 PM Version By: Fredirick Maudlin MD FACS Entered By: Sharyn Creamer on 03/16/2022 17:03:27 -------------------------------------------------------------------------------- SuperBill Details Patient Name: Date of Service: Allison Gross 03/16/2022 Medical Record Number: [id number] Patient Account Number: [id number] Date of Birth/Sex: Treating RN: 1946-01-19 (76 y.o. F) Primary Care Provider: Tanda Rockers Other Clinician: Referring Provider: Treating Provider/Extender: Berniece Salines in Treatment: 0 Diagnosis Coding ICD-10 Codes Code Description [phone] Non-pressure chronic ulcer of other part of left lower leg with fat layer exposed I10 Essential (primary) hypertension Facility Procedures : CPT4 Code: 99357017 Description: 99213 - WOUND CARE VISIT-LEV 3 EST PT Modifier: 25 Quantity: 1 : CPT4 Code: 79390300 Description: 92330 - DEBRIDE WOUND 1ST 20 SQ CM OR < ICD-10 Diagnosis Description L97.822 Non-pressure chronic ulcer of other part of left lower leg with fat layer expose Modifier: d Quantity: 1 Physician Procedures : CPT4 Code  Description Modifier [id number] 33545 - WC PHYS LEVEL 4 - NEW PT 25 ICD-10 Diagnosis Description L97.822 Non-pressure chronic ulcer of other part of left lower leg with fat layer exposed  She should complete the course of oral antibiotics prescribed by infectious disease. She was instructed to elevate her leg is much as possible throughout the day and at night while she is sleeping. Follow-up in 1 week. Electronic Signature(s) Signed: 04/06/2022 11:59:57 AM By: Fredirick Maudlin MD FACS Previous Signature: 03/16/2022 2:46:31 PM Version By: Fredirick Maudlin MD FACS Previous Signature: 03/16/2022 2:44:15 PM Version By: Fredirick Maudlin MD FACS Entered By: Fredirick Maudlin on 04/06/2022 11:59:57 -------------------------------------------------------------------------------- HxROS Details Patient  Name: Date of Service: Allison Gross 03/16/2022 1:30 PM Medical Record Number: [id number] Patient Account Number: [id number] Date of Birth/Sex: Treating RN: 1945-06-13 (76 y.o. 56 Allison Gross., [place], [place] ([id number]) [id number] Page 7 of 9 Primary Care Provider: Tanda Rockers Other Clinician: Referring Provider: Treating Provider/Extender: Berniece Salines in Treatment: 0 Information Obtained From Patient Constitutional Symptoms (General Health) Complaints and Symptoms: Negative for: Fatigue; Fever; Chills; Marked Weight Change Eyes Complaints and Symptoms: Negative for: Dry Eyes; Vision Changes; Glasses / Contacts Ear/Nose/Mouth/Throat Complaints and Symptoms: Negative for: Chronic sinus problems or rhinitis Respiratory Complaints and Symptoms: Positive for: Chronic or frequent coughs Medical History: Positive for: Asthma Gastrointestinal Complaints and Symptoms: Negative for: Frequent diarrhea; Nausea; Vomiting Medical History: Past Medical History Notes: reflux Endocrine Complaints and Symptoms: Negative for: Heat/cold intolerance Genitourinary Complaints and Symptoms: Negative for: Frequent urination Integumentary (Skin) Complaints and Symptoms: Positive for: Wounds - left lower leg Musculoskeletal Complaints and Symptoms: Negative for: Muscle Pain; Muscle Weakness Neurologic Complaints and Symptoms: Negative for: Numbness/parasthesias Medical History: Positive for: Seizure Disorder - petit mal seizures Psychiatric Complaints and Symptoms: Negative for: Claustrophobia Hematologic/Lymphatic Cardiovascular Medical History: Positive for: Hypertension Hepp, Allison Gross ([id number]) [id number] Page 8 of 9 Immunological Immunizations Pneumococcal Vaccine: Received Pneumococcal Vaccination: Yes Received Pneumococcal Vaccination On or After 60th Birthday:  Yes Implantable Devices None Hospitalization / Surgery History Type of Hospitalization/Surgery cataract extraction 2022 oophorectomy 2005 cholecystectomy hernia repair mohs surgery hysterectomy Family and Social History Cancer: Yes - Maternal Grandparents,Paternal Grandparents; Diabetes: Yes; Heart Disease: Yes - Maternal Grandparents; Hypertension: Yes; Seizures: No; Stroke: No; Thyroid Problems: No; Tuberculosis: No; Never smoker; Marital Status - Married; Alcohol Use: Rarely; Drug Use: No History; Caffeine Use: Daily; Financial Concerns: No; Food, Clothing or Shelter Needs: No; Support System Lacking: No; Transportation Concerns: No Engineer, maintenance) Signed: 03/16/2022 5:04:15 PM By: Sharyn Creamer RN, BSN Signed: 03/17/2022 7:46:49 AM By: Fredirick Maudlin MD FACS Previous Signature: 03/16/2022 4:01:47 PM Version By: Fredirick Maudlin MD FACS Entered By: Sharyn Creamer on 03/16/2022 17:03:27 -------------------------------------------------------------------------------- SuperBill Details Patient Name: Date of Service: Allison Gross 03/16/2022 Medical Record Number: [id number] Patient Account Number: [id number] Date of Birth/Sex: Treating RN: 1946-01-19 (76 y.o. F) Primary Care Provider: Tanda Rockers Other Clinician: Referring Provider: Treating Provider/Extender: Berniece Salines in Treatment: 0 Diagnosis Coding ICD-10 Codes Code Description [phone] Non-pressure chronic ulcer of other part of left lower leg with fat layer exposed I10 Essential (primary) hypertension Facility Procedures : CPT4 Code: 99357017 Description: 99213 - WOUND CARE VISIT-LEV 3 EST PT Modifier: 25 Quantity: 1 : CPT4 Code: [id number] Description: 92330 - DEBRIDE WOUND 1ST 20 SQ CM OR < ICD-10 Diagnosis Description L97.822 Non-pressure chronic ulcer of other part of left lower leg with fat layer expose Modifier: d Quantity: 1 Physician Procedures : CPT4 Code  Description Modifier [id number] 33545 - WC PHYS LEVEL 4 - NEW PT 25 ICD-10 Diagnosis Description L97.822 Non-pressure chronic ulcer of other part of left lower leg with fat layer exposed  She should complete the course of oral antibiotics prescribed by infectious disease. She was instructed to elevate her leg is much as possible throughout the day and at night while she is sleeping. Follow-up in 1 week. Electronic Signature(s) Signed: 04/06/2022 11:59:57 AM By: Fredirick Maudlin MD FACS Previous Signature: 03/16/2022 2:46:31 PM Version By: Fredirick Maudlin MD FACS Previous Signature: 03/16/2022 2:44:15 PM Version By: Fredirick Maudlin MD FACS Entered By: Fredirick Maudlin on 04/06/2022 11:59:57 -------------------------------------------------------------------------------- HxROS Details Patient  Name: Date of Service: Allison Gross 03/16/2022 1:30 PM Medical Record Number: [id number] Patient Account Number: [id number] Date of Birth/Sex: Treating RN: 1945-06-13 (76 y.o. 56 Allison Gross., [place], [place] ([id number]) [id number] Page 7 of 9 Primary Care Provider: Tanda Rockers Other Clinician: Referring Provider: Treating Provider/Extender: Berniece Salines in Treatment: 0 Information Obtained From Patient Constitutional Symptoms (General Health) Complaints and Symptoms: Negative for: Fatigue; Fever; Chills; Marked Weight Change Eyes Complaints and Symptoms: Negative for: Dry Eyes; Vision Changes; Glasses / Contacts Ear/Nose/Mouth/Throat Complaints and Symptoms: Negative for: Chronic sinus problems or rhinitis Respiratory Complaints and Symptoms: Positive for: Chronic or frequent coughs Medical History: Positive for: Asthma Gastrointestinal Complaints and Symptoms: Negative for: Frequent diarrhea; Nausea; Vomiting Medical History: Past Medical History Notes: reflux Endocrine Complaints and Symptoms: Negative for: Heat/cold intolerance Genitourinary Complaints and Symptoms: Negative for: Frequent urination Integumentary (Skin) Complaints and Symptoms: Positive for: Wounds - left lower leg Musculoskeletal Complaints and Symptoms: Negative for: Muscle Pain; Muscle Weakness Neurologic Complaints and Symptoms: Negative for: Numbness/parasthesias Medical History: Positive for: Seizure Disorder - petit mal seizures Psychiatric Complaints and Symptoms: Negative for: Claustrophobia Hematologic/Lymphatic Cardiovascular Medical History: Positive for: Hypertension Hepp, Allison Gross ([id number]) [id number] Page 8 of 9 Immunological Immunizations Pneumococcal Vaccine: Received Pneumococcal Vaccination: Yes Received Pneumococcal Vaccination On or After 60th Birthday:  Yes Implantable Devices None Hospitalization / Surgery History Type of Hospitalization/Surgery cataract extraction 2022 oophorectomy 2005 cholecystectomy hernia repair mohs surgery hysterectomy Family and Social History Cancer: Yes - Maternal Grandparents,Paternal Grandparents; Diabetes: Yes; Heart Disease: Yes - Maternal Grandparents; Hypertension: Yes; Seizures: No; Stroke: No; Thyroid Problems: No; Tuberculosis: No; Never smoker; Marital Status - Married; Alcohol Use: Rarely; Drug Use: No History; Caffeine Use: Daily; Financial Concerns: No; Food, Clothing or Shelter Needs: No; Support System Lacking: No; Transportation Concerns: No Engineer, maintenance) Signed: 03/16/2022 5:04:15 PM By: Sharyn Creamer RN, BSN Signed: 03/17/2022 7:46:49 AM By: Fredirick Maudlin MD FACS Previous Signature: 03/16/2022 4:01:47 PM Version By: Fredirick Maudlin MD FACS Entered By: Sharyn Creamer on 03/16/2022 17:03:27 -------------------------------------------------------------------------------- SuperBill Details Patient Name: Date of Service: Allison Gross 03/16/2022 Medical Record Number: [id number] Patient Account Number: [id number] Date of Birth/Sex: Treating RN: 1946-01-19 (76 y.o. F) Primary Care Provider: Tanda Rockers Other Clinician: Referring Provider: Treating Provider/Extender: Berniece Salines in Treatment: 0 Diagnosis Coding ICD-10 Codes Code Description [phone] Non-pressure chronic ulcer of other part of left lower leg with fat layer exposed I10 Essential (primary) hypertension Facility Procedures : CPT4 Code: [id number] Description: 99213 - WOUND CARE VISIT-LEV 3 EST PT Modifier: 25 Quantity: 1 : CPT4 Code: 79390300 Description: 92330 - DEBRIDE WOUND 1ST 20 SQ CM OR < ICD-10 Diagnosis Description L97.822 Non-pressure chronic ulcer of other part of left lower leg with fat layer expose Modifier: d Quantity: 1 Physician Procedures : CPT4 Code  Description Modifier [id number] - WC PHYS LEVEL 4 - NEW PT 25 ICD-10 Diagnosis Description L97.822 Non-pressure chronic ulcer of other part of left lower leg with fat layer exposed  By: Fredirick Maudlin on 03/16/2022 14:40:33 -------------------------------------------------------------------------------- Progress Note Details Patient Name: Date of Service: Allison Gross 03/16/2022 1:30 PM Medical Record Number: [id number] Patient Account Number: [id number] Date of Birth/Sex: Treating RN: 03/19/1946 (76 y.o. F) Primary Care Provider: Tanda Rockers Other Clinician: Referring Provider: Treating Provider/Extender: Berniece Salines in Treatment: 0 Subjective Chief Complaint Information obtained from Patient Patient seen for complaints of Non-Healing Wound. History of Present Illness (HPI) ADMISSION 03/16/2022 This is a 76 year old woman who struck her leg on a car door near the beginning of October. She was seen at an urgent care in [place], [place] and given a course of doxycycline. Her symptoms fail to improve and she saw a different urgent care provider who gave a 10-day course of Keflex  without improvement. She presented to the emergency department on March 03, 2022 with concern for cellulitis and a wound infection. Due to swelling in the legs, a DVT scan was performed which was negative. She was given an injection of dalbavancin and referred to infectious disease. She saw ID on November 21. She was prescribed a 10-day course of cefadroxil and referred to the wound care center for further evaluation and management. ABI in clinic today was 1.2. On her left lower leg, there is a wound just lateral to the anterior tibial surface. There is some rubbery slough accumulation. She has 2+ pitting edema. No purulent drainage or malodor. YUKA, LALLIER ([id number]) [id number] Page 5 of 9 Patient History Information obtained from Patient. Allergies Sulfa (Sulfonamide Antibiotics) Family History Cancer - Maternal Grandparents,Paternal Grandparents, Diabetes, Heart Disease - Maternal Grandparents, Hypertension, No family history of Seizures, Stroke, Thyroid Problems, Tuberculosis. Social History Never smoker, Marital Status - Married, Alcohol Use - Rarely, Drug Use - No History, Caffeine Use - Daily. Medical History Respiratory Patient has history of Asthma Cardiovascular Patient has history of Hypertension Neurologic Patient has history of Seizure Disorder - petit mal seizures Hospitalization/Surgery History - cataract extraction 2022. - oophorectomy 2005. - cholecystectomy. - hernia repair. - mohs surgery. - hysterectomy. Medical A Surgical History Notes nd Gastrointestinal reflux Review of Systems (ROS) Constitutional Symptoms (General Health) Denies complaints or symptoms of Fatigue, Fever, Chills, Marked Weight Change. Eyes Denies complaints or symptoms of Dry Eyes, Vision Changes, Glasses / Contacts. Ear/Nose/Mouth/Throat Denies complaints or symptoms of Chronic sinus problems or rhinitis. Respiratory Complains or has symptoms of Chronic  or frequent coughs. Gastrointestinal Denies complaints or symptoms of Frequent diarrhea, Nausea, Vomiting. Endocrine Denies complaints or symptoms of Heat/cold intolerance. Genitourinary Denies complaints or symptoms of Frequent urination. Integumentary (Skin) Complains or has symptoms of Wounds - left lower leg. Musculoskeletal Denies complaints or symptoms of Muscle Pain, Muscle Weakness. Neurologic Denies complaints or symptoms of Numbness/parasthesias. Psychiatric Denies complaints or symptoms of Claustrophobia. Objective Constitutional No acute distress. Vitals Time Taken: 2:56 PM, Height: 60 in, Source: Stated, Weight: 154 lbs, Source: Stated, BMI: 30.1, Temperature: 97.9 F, Pulse: 78 bpm, Respiratory Rate: 18 breaths/min, Blood Pressure: 132/84 mmHg. Respiratory Normal work of breathing on room air.. General Notes: 03/16/2022: On her left lower leg, there is a wound just lateral to the anterior tibial surface. There is some rubbery slough accumulation. She has 2+ pitting edema. No purulent drainage or malodor. Integumentary (Hair, Skin) Wound #1 status is Open. Original cause of wound was Trauma. The date acquired was: 01/23/2022. The wound is located on the Left,Lateral Lower Leg. The wound measures 3cm length x 2.3cm width x 0.3cm depth; 5.419cm^2 area and 1.626cm^3 volume.

## 2022-03-16 NOTE — Progress Notes (Signed)
aid None/bed rest/wheelchair/nurse 0 Gross Crutches/cane/walker 0 No Furniture 0 No Intravenous therapy Access/Saline/Heparin Lock 0 No Gait/Transferring Normal/ bed rest/ wheelchair 0 Gross Weak (short steps with or without shuffle, stooped but able to lift head while walking, may seek 0 No support from furniture) Impaired (short steps with shuffle, may have difficulty arising from chair, head down, impaired 0 No balance) Mental Status Oriented to own ability 0 Gross Electronic Signature(s) Signed: 03/16/2022 5:04:15 PM By: Sharyn Creamer RN, BSN Entered By: Sharyn Creamer on 03/16/2022 14:00:22 -------------------------------------------------------------------------------- Foot Assessment Details Patient Name: Date of Service: Allison Gross 03/16/2022 1:30 PM Medical Record Number: [id number] Patient Account Number: [id number] Date of Birth/Sex: Treating RN: 1945/08/31 (76 y.o. Allison Gross Primary Care Tyna Huertas: Tanda Rockers Other Clinician: Referring Mclain Freer: Treating Granvel Proudfoot/Extender: Forest Becker Weeks in Treatment: 0 Foot Assessment Items Site Locations + = Sensation present, - = Sensation absent, C = Callus, U = Ulcer R = Redness, W = Warmth, M = Maceration, PU =  Pre-ulcerative lesion F = Fissure, S = Swelling, D = Dryness Assessment Right: Left: Other Deformity: No No Prior Foot Ulcer: No No Prior Amputation: No No Charcot Joint: No No Ambulatory Status: Gait: Shilling, Aislinn ([id number]) [id number] Page 4 of 4 Electronic Signature(s) Signed: 03/16/2022 5:04:15 PM By: Sharyn Creamer RN, BSN Entered By: Sharyn Creamer on 03/16/2022 14:12:17 -------------------------------------------------------------------------------- Nutrition Risk Screening Details Patient Name: Date of Service: Allison Gross 03/16/2022 1:30 PM Medical Record Number: [id number] Patient Account Number: [id number] Date of Birth/Sex: Treating RN: [birth year] (76 y.o. Allison Gross Primary Care Reagen Goates: Tanda Rockers Other Clinician: Referring Gracee Ratterree: Treating Zlatan Hornback/Extender: Forest Becker Weeks in Treatment: 0 Height (in): 60 Weight (lbs): 154 Body Mass Index (BMI): 30.1 Nutrition Risk Screening Items Score Screening NUTRITION RISK SCREEN: I have an illness or condition that made me change the kind and/or amount of food I eat 0 No I eat fewer than two meals per day 0 No I eat few fruits and vegetables, or milk products 0 No I have three or more drinks of beer, liquor or wine almost every day 0 No I have tooth or mouth problems that make it hard for me to eat 0 No I don't always have enough money to buy the food I need 0 No I eat alone most of the time 0 No I take three or more different prescribed or over-the-counter drugs a day 1 Gross Without wanting to, I have lost or gained 10 pounds in the last six months 0 No I am not always physically able to shop, cook and/or feed myself 0 No Nutrition Protocols Good Risk Protocol Moderate Risk Protocol High Risk Proctocol Risk Level: Good Risk Score: 1 Electronic Signature(s) Signed: 03/16/2022 5:04:15 PM By: Sharyn Creamer RN, BSN Entered By:  Sharyn Creamer on 03/16/2022 14:00:36  Allison Gross, Allison Gross ([id number]) [id number] Page 1 of 4 Visit Report for 03/16/2022 Abuse Risk Screen Details Patient Name: Date of Service: Allison Gross 03/16/2022 1:30 PM Medical Record Number: [id number] Patient Account Number: [id number] Date of Birth/Sex: Treating RN: 03-19-1946 (76 y.o. Allison Gross Primary Care Romanda Turrubiates: Tanda Rockers Other Clinician: Referring Emika Tiano: Treating Mc Bloodworth/Extender: Forest Becker Weeks in Treatment: 0 Abuse Risk Screen Items Answer ABUSE RISK SCREEN: Has anyone close to you tried to hurt or harm you recentlyo No Do you feel uncomfortable with anyone in your familyo No Has anyone forced you do things that you didnt want to doo No Electronic Signature(s) Signed: 03/16/2022 5:04:15 PM By: Sharyn Creamer RN, BSN Entered By: Sharyn Creamer on 03/16/2022 13:58:39 -------------------------------------------------------------------------------- Activities of Daily Living Details Patient Name: Date of Service: Allison Gross 03/16/2022 1:30 PM Medical Record Number: [id number] Patient Account Number: [id number] Date of Birth/Sex: Treating RN: September 26, 1945 (76 y.o. Allison Gross Primary Care Anderson Middlebrooks: Tanda Rockers Other Clinician: Referring Lynnann Knudsen: Treating Letrice Pollok/Extender: Forest Becker Weeks in Treatment: 0 Activities of Daily Living Items Answer Activities of Daily Living (Please select one for each item) Drive Automobile Completely Able T Medications ake Completely Able Use T elephone Completely Able Care for Appearance Completely Able Use T oilet Completely Able Bath / Shower Completely Able Dress Self Completely Able Feed Self Completely Able Walk Completely Able Get In / Out Bed Completely Able Housework Completely Able Prepare Meals Completely Able Handle Money Completely Able Shop for Self Completely Able Electronic  Signature(s) Signed: 03/16/2022 5:04:15 PM By: Sharyn Creamer RN, BSN Entered By: Sharyn Creamer on 03/16/2022 13:59:16 Allison Gross, Allison Gross ([id number]) [id number] Page 2 of 4 -------------------------------------------------------------------------------- Education Screening Details Patient Name: Date of Service: Allison Gross 03/16/2022 1:30 PM Medical Record Number: [id number] Patient Account Number: [id number] Date of Birth/Sex: Treating RN: 07-21-1945 (76 y.o. Allison Gross Primary Care Lark Runk: Tanda Rockers Other Clinician: Referring Emanuel Dowson: Treating Bunyan Brier/Extender: Lilia Argue in Treatment: 0 Learning Preferences/Education Level/Primary Language Learning Preference: Explanation, Demonstration, Printed Material Highest Education Level: College or Above Preferred Language: English Cognitive Barrier Language Barrier: No Translator Needed: No Memory Deficit: No Emotional Barrier: No Cultural/Religious Beliefs Affecting Medical Care: No Physical Barrier Impaired Vision: No Impaired Hearing: No Decreased Hand dexterity: No Knowledge/Comprehension Knowledge Level: High Comprehension Level: High Ability to understand written instructions: High Ability to understand verbal instructions: High Motivation Anxiety Level: Calm Cooperation: Cooperative Education Importance: Acknowledges Need Interest in Health Problems: Asks Questions Perception: Coherent Willingness to Engage in Self-Management High Activities: Readiness to Engage in Self-Management High Activities: Electronic Signature(s) Signed: 03/16/2022 5:04:15 PM By: Sharyn Creamer RN, BSN Entered By: Sharyn Creamer on 03/16/2022 14:00:04 -------------------------------------------------------------------------------- Fall Risk Assessment Details Patient Name: Date of Service: Allison Gross, Allison Gross Gross 03/16/2022 1:30 PM Medical Record Number:  [id number] Patient Account Number: [id number] Date of Birth/Sex: Treating RN: 1945-05-20 (76 y.o. Allison Gross Primary Care Gwynevere Lizana: Tanda Rockers Other Clinician: Referring Annistyn Depass: Treating Kasyn Stouffer/Extender: Forest Becker Weeks in Treatment: 0 Fall Risk Assessment Items Have you had 2 or more falls in the last 12 monthso 0 No Have you had any fall that resulted in injury in the last 12 monthso 0 No Allison Gross, Allison Gross ([id number]) [id number] Page 3 of 4 FALLS RISK SCREEN History of falling - immediate or within 3 months 0 No Secondary diagnosis (Do you have 2 or more medical diagnoseso) 0 No Ambulatory

## 2022-03-17 ENCOUNTER — Ambulatory Visit (INDEPENDENT_AMBULATORY_CARE_PROVIDER_SITE_OTHER): Payer: Medicare Other

## 2022-03-17 ENCOUNTER — Encounter: Payer: Self-pay | Admitting: Internal Medicine

## 2022-03-17 ENCOUNTER — Other Ambulatory Visit: Payer: Self-pay

## 2022-03-17 ENCOUNTER — Ambulatory Visit: Payer: Self-pay | Admitting: Infectious Disease

## 2022-03-17 ENCOUNTER — Encounter: Payer: Self-pay | Admitting: Infectious Disease

## 2022-03-17 ENCOUNTER — Ambulatory Visit (INDEPENDENT_AMBULATORY_CARE_PROVIDER_SITE_OTHER): Payer: Medicare Other | Admitting: Internal Medicine

## 2022-03-17 ENCOUNTER — Ambulatory Visit (INDEPENDENT_AMBULATORY_CARE_PROVIDER_SITE_OTHER): Payer: Medicare Other | Admitting: Infectious Disease

## 2022-03-17 VITALS — BP 124/70 | HR 75 | Temp 97.8°F | Ht 60.0 in | Wt 155.6 lb

## 2022-03-17 VITALS — BP 133/83 | HR 74 | Resp 16 | Ht 60.0 in | Wt 155.0 lb

## 2022-03-17 DIAGNOSIS — L0390 Cellulitis, unspecified: Secondary | ICD-10-CM | POA: Diagnosis present

## 2022-03-17 DIAGNOSIS — J4541 Moderate persistent asthma with (acute) exacerbation: Secondary | ICD-10-CM | POA: Diagnosis not present

## 2022-03-17 DIAGNOSIS — Z7185 Encounter for immunization safety counseling: Secondary | ICD-10-CM | POA: Diagnosis not present

## 2022-03-17 DIAGNOSIS — R058 Other specified cough: Secondary | ICD-10-CM

## 2022-03-17 DIAGNOSIS — R0609 Other forms of dyspnea: Secondary | ICD-10-CM

## 2022-03-17 DIAGNOSIS — J45991 Cough variant asthma: Secondary | ICD-10-CM | POA: Diagnosis not present

## 2022-03-17 MED ORDER — MOMETASONE FURO-FORMOTEROL FUM 200-5 MCG/ACT IN AERO: INHALATION_SPRAY | RESPIRATORY_TRACT | 11 refills | Status: DC

## 2022-03-17 NOTE — Progress Notes (Signed)
Subjective:  Chief complaint: Follow-up for wound with prior cellulitic area   Patient ID: Allison Gross, female    DOB: 1946-03-21, 76 y.o.   MRN: [id number]  HPI  Allison Gross is a 76 year old Caucasian lady history of hypertension osteopenia asthma who was in [facility] when she accidentally slammed the car door on her leg.  This was in early October.  Tried to see 3 different urgent cares in the [place] area but they were all closed at the time.  She then tried another urgent care in [place].  Ultimately she seen and given antimicrobial ointment along with doxycycline.  She had worsening of the redness in her leg and swelling.  She was then given Keflex 500 mg 4 times a day and had improvement in the erythema and the appearance of the wound.  Roughly a week after coming off Keflex she had worsening of erythema and swelling of the leg and came to the ER in [facility].  Doppler was performed which failed to show evidence of DVT.  She was given a dose of dalbavancin.  She was referred to our clinic and infectious disease.  Last visit she told me that the wound now is doing a bit worse but she believes because she remove the bandage this week and it tore off a scab that was forming.  Placed her on cefadroxil and referred her to wound care.  She has been seen by Dr. Lady Gary who has her with proper bandaging and elevating the leg and going to follow her closely.  The wound was not felt to be showing of any evidence of infection when she was seen by Dr. Lady Gary yesterday.       Past Medical History:  Diagnosis Date   Asthma    Cancer (HCC)    basil,squamous   Hypertension    Osteopenia    Seizures (HCC) 15 years ago   1982's from childbirth   Wound cellulitis 03/09/2022    Past Surgical History:  Procedure Laterality Date   CATARACT EXTRACTION  2022   CHOLECYSTECTOMY     HERNIA REPAIR     MOHS SURGERY     OOPHORECTOMY  2005   ET tube 7 1/2 was too big per  pt/made pt have asthma attack. per pt   VAGINAL HYSTERECTOMY      Family History  Problem Relation Age of Onset   Colon cancer Neg Hx       Social History   Socioeconomic History   Marital status: Married    Spouse name: Not on file   Number of children: Not on file   Years of education: Not on file   Highest education level: Not on file  Occupational History   Not on file  Tobacco Use   Smoking status: Former    Packs/day: 0.10    Years: 5.00    Total pack years: 0.50    Types: Cigarettes    Quit date: 04/19/1984    Years since quitting: 37.9   Smokeless tobacco: Never   Tobacco comments:    reports smoked socially  Vaping Use   Vaping Use: Never used  Substance and Sexual Activity   Alcohol use: Yes    Comment: occasional wine   Drug use: No   Sexual activity: Not on file  Other Topics Concern   Not on file  Social History Narrative   Right handed    Lives with her husband    Social Determinants  of Health   Financial Resource Strain: Not on file  Food Insecurity: Not on file  Transportation Needs: Not on file  Physical Activity: Not on file  Stress: Not on file  Social Connections: Not on file    Allergies  Allergen Reactions   Sulfonamide Derivatives Nausea And Vomiting     Current Outpatient Medications:    albuterol (PROAIR HFA) 108 (90 Base) MCG/ACT inhaler, INHALE 2 PUFFS EVERY 4 HOURS AS NEEDED FOR WHEEZING AND SHORTNESS OF BREATH., Disp: 8.5 g, Rfl: 10   budesonide-formoterol (SYMBICORT) 80-4.5 MCG/ACT inhaler, INHALE 2 PUFFS FIRST THING IN THE MORNING AND 2 PUFFS 12 HOURS LATER, Disp: 10.2 g, Rfl: 11   calcium-vitamin D (OSCAL WITH D) 500-200 MG-UNIT per tablet, Take 1 tablet by mouth daily with breakfast., Disp: , Rfl:    carbamazepine (CARBATROL) 200 MG 12 hr capsule, Take 1 capsule (200 mg total) by mouth 2 (two) times daily., Disp: 180 capsule, Rfl: 3   cefadroxil (DURICEF) 500 MG capsule, Take 2 capsules (1,000 mg total) by mouth 2 (two)  times daily for 14 days., Disp: 56 capsule, Rfl: 0   fexofenadine (ALLEGRA) 180 MG tablet, Take 180 mg by mouth every morning., Disp: , Rfl:    Guaifenesin 1200 MG TB12, Take 1 tablet by mouth 2 (two) times daily as needed., Disp: , Rfl:    ibuprofen (ADVIL,MOTRIN) 400 MG tablet, Take 400 mg by mouth every 8 (eight) hours as needed (joint pain)., Disp: , Rfl:    ipratropium-albuterol (DUONEB) 0.5-2.5 (3) MG/3ML SOLN, Take 3 mLs by nebulization every 4 (four) hours as needed (Wheezing/shortness of breath ((PLAN C))). (Patient not taking: Reported on 09/24/2021), Disp: 360 mL, Rfl: 2   losartan-hydrochlorothiazide (HYZAAR) 50-12.5 MG per tablet, Take 1 tablet by mouth every morning. , Disp: , Rfl:    melatonin 5 MG TABS, Take 5 mg by mouth at bedtime. Takes 10mg  total HS, Disp: , Rfl:    Multiple Vitamin (MULTIVITAMIN) tablet, Take 1 tablet by mouth every morning., Disp: , Rfl:    ondansetron (ZOFRAN) 4 MG tablet, Take 1 tablet by mouth 1 hour prior to each prep dose before colonoscopy, Disp: 2 tablet, Rfl: 0   pantoprazole (PROTONIX) 40 MG tablet, Take 30- 60 min before your first and last meals of the day, Disp: 60 tablet, Rfl: 2   potassium chloride (K-DUR) 10 MEQ tablet, Take 1 tablet by mouth daily. ((2 tabs on Mondays and Fridays)), Disp: , Rfl:    Respiratory Therapy Supplies (FLUTTER) DEVI, As instructed, Disp: 1 each, Rfl: 0   Turmeric 500 MG CAPS, Take 1 capsule by mouth every morning. , Disp: , Rfl:    vitamin C (ASCORBIC ACID) 500 MG tablet, Take 500 mg by mouth every morning., Disp: , Rfl:    Review of Systems  Constitutional:  Negative for activity change, appetite change, chills, diaphoresis, fatigue, fever and unexpected weight change.  HENT:  Negative for congestion, rhinorrhea, sinus pressure, sneezing, sore throat and trouble swallowing.   Eyes:  Negative for photophobia and visual disturbance.  Respiratory:  Negative for cough, chest tightness, shortness of breath, wheezing and  stridor.   Cardiovascular:  Negative for chest pain, palpitations and leg swelling.  Gastrointestinal:  Negative for abdominal distention, abdominal pain, anal bleeding, blood in stool, constipation, diarrhea, nausea and vomiting.  Genitourinary:  Negative for difficulty urinating, dysuria, flank pain and hematuria.  Musculoskeletal:  Negative for arthralgias, back pain, gait problem, joint swelling and myalgias.  Skin:  Positive for  wound. Negative for color change, pallor and rash.  Neurological:  Negative for dizziness, tremors, weakness and light-headedness.  Hematological:  Negative for adenopathy. Does not bruise/bleed easily.  Psychiatric/Behavioral:  Negative for agitation, behavioral problems, confusion, decreased concentration, dysphoric mood and sleep disturbance.        Objective:   Physical Exam Constitutional:      General: She is not in acute distress.    Appearance: Normal appearance. She is well-developed. She is not ill-appearing or diaphoretic.  HENT:     Head: Normocephalic and atraumatic.     Right Ear: Hearing and external ear normal.     Left Ear: Hearing and external ear normal.     Nose: No nasal deformity or rhinorrhea.  Eyes:     General: No scleral icterus.    Conjunctiva/sclera: Conjunctivae normal.     Right eye: Right conjunctiva is not injected.     Left eye: Left conjunctiva is not injected.     Pupils: Pupils are equal, round, and reactive to light.  Neck:     Vascular: No JVD.  Cardiovascular:     Rate and Rhythm: Normal rate and regular rhythm.     Heart sounds: S1 normal and S2 normal.  Pulmonary:     Effort: Pulmonary effort is normal. No respiratory distress.     Breath sounds: No wheezing.  Abdominal:     General: Bowel sounds are normal. There is no distension.     Palpations: Abdomen is soft.     Tenderness: There is no abdominal tenderness.  Musculoskeletal:        General: Normal range of motion.     Right shoulder: Normal.      Left shoulder: Normal.     Cervical back: Normal range of motion and neck supple.     Right hip: Normal.     Left hip: Normal.     Right knee: Normal.     Left knee: Normal.  Lymphadenopathy:     Head:     Right side of head: No submandibular, preauricular or posterior auricular adenopathy.     Left side of head: No submandibular, preauricular or posterior auricular adenopathy.     Cervical: No cervical adenopathy.     Right cervical: No superficial or deep cervical adenopathy.    Left cervical: No superficial or deep cervical adenopathy.  Skin:    General: Skin is warm and dry.     Coloration: Skin is not pale.     Findings: No abrasion, bruising, ecchymosis, erythema, lesion or rash.     Nails: There is no clubbing.  Neurological:     General: No focal deficit present.     Mental Status: She is alert and oriented to person, place, and time.     Sensory: No sensory deficit.     Coordination: Coordination normal.     Gait: Gait normal.  Psychiatric:        Attention and Perception: She is attentive.        Mood and Affect: Mood normal.        Speech: Speech normal.        Behavior: Behavior normal. Behavior is cooperative.        Thought Content: Thought content normal.        Judgment: Judgment normal.    Leg wrapped today      Assessment & Plan:  Left leg wound with prior cellulitis after trauma from car door:  She has received dalbavancin and has  been on cefadroxil now.  She has been connected to wound care who are going to attend to her wound and I am really happy that she got in with them.  She does not seem to have any evidence of infection based on Dr. Leone Brand note yesterday.  If she would like to finish her cefadroxil she can.  I do not think she needs further assistance from [place] and infectious disease at this point in time.  Seen counseling recommended RSV vaccine which she we will pursue she was going to wait until she was off antibiotics but there is no need  to wait until she is off antibiotics.

## 2022-03-17 NOTE — Assessment & Plan Note (Signed)
Worse since  2000 despite max asthma rx   - failed singulair around 2005  - allergy profile 08/14/2014 >  IgE  129 with pos RAST Dust >  Cat > dog   - Spirometry 05/17/2016  wnl during flare of chronic cough - gabapentin 100 mg tid as part of action plan rec 08/02/2016  - repeat Allergy profile 06/13/2017 >  Eos 0.1 /  IgE 62 same rast panel as 08/14/14 study   Adequate control on present rx, reviewed in detail with pt > no change in rx needed

## 2022-03-17 NOTE — Patient Instructions (Addendum)
Please remember to go to the  x-ray department  for your tests - we will call you with the results when they are available    Change symbicort to dulera 200 Take 2 puffs first thing in am and then another 2 puffs about 12 hours later.  and let me know if not better in a few weeks.   Please schedule a follow up visit in 3 months but call sooner if needed

## 2022-03-17 NOTE — Assessment & Plan Note (Signed)
Onset around 2000  - HFA 90% p coaching 04/23/11 - c/o hoarseness > added spacer 12/01/2012  > improved but stopped using it  - maint on symbicort 80 2bid  - try taper off gerd rx 11/13/14 > flared end of August 2016  - NO 05/21/2015  = 5  - Spirometry 05/17/2016  Nl p am symbicort 80 x 2 and doe reported   - Spirometry 06/13/2017  FEV1 1.34 (68%)  Ratio 73 p am symb 80   X 2   - 03/17/2022 changed to  dulera to 200 bid   p reported increased need for saba x several months  No active wheeze  but may well benefit from a higher dose of ICS due to doe and if not improving will need to return for a full w/u but stated she was in a hurry for another appt and would let me know if not improving over the next week or two

## 2022-03-17 NOTE — Progress Notes (Signed)
Subjective:     Patient ID: Allison Gross, female   DOB: [birth year]   MRN: [id number]   Brief patient profile:  64   yowf quit light smoking 1973 with intermittent sinus/ bronchitis but no chronic c/o's or need for rx not much better after stopped smoking then much worse after around 2000 despite daily maint rx for asthma. Previously found to be allergic to dust, cats and trees and mold but no seasonal pattern and did not feel allergy shots helped in 1990s      History of Present Illness  09/21/2013 Acute OV  Complains of hoarseness, some increased SOB, low grade temp, dry hacking cough with occasional yellow mucus, some tightness x 1 week.  She denies any calf pain, edema, hemoptysis, orthopnea, PND, nausea, vomiting, diarrhea. Does feel, that she's had a low-grade fever. Returned yesterday from a 2 week cruise.  Says many people on the cruise were sick with cough and similar symptoms. Was in [place].  Finished zpak on 6/3 and pred pak today by ships' physician. Has been using tramadol to today for cough with minimal help rec Levaquin 500mg  daily for 7 days  Prednisone taper over next week.  Delsym 2 tsp Twice daily   For cough  Tramadol 50mg  1 every 4hr as needed for breakthrough cough.  Mucinex Twice daily  As needed  Congestion . 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  She has not had to use her albuterol inhaler or neb recently.   Dyspnea:  Walking  Not as much but Not limited by breathing from desired activities   Cough: resolved/ off tramadol  Sleeping: fine flat bed with large pillow SABA use: occ before ex rec If cough flares, use tramadol 50 mg up to every 4 hours or up to 3 days and if not improving we need to see you right away  Cough started a few days  prior to Dec 21 POS COVID  testing and the "worse ever since"   05/05/2021  f/u ov/Allison Gross re: cough variant asthma   maint on symb 80 2bid / ppi ac  H1 did not help the noct cough  Chief Complaint  Patient presents with   Follow-up    Cough is better but has not resolved completely. She has been using neb 2 x daily.   Best rx sipping warm water Dyspnea: better  Cough: better x 4-5 days but still nocturnla worse despite 1st gen H1 blockers per guidelines   Sleeping: flat bed with pillow  SABA use: not sure  02: none  Covid status:   vax x 4  Rec Take delsym two tsp every 12 hours and supplement if needed with  tramadol 50 mg up to 1-2 every 4 hours  Once you have eliminated the cough for 3 straight days try reducing the tramadol first,  then the delsym as tolerated.   When cough flares > protonix  40 mg Take 30- 60 min before your first and last meals of the day until better week and no need for cough medication for a week. Please schedule a follow up visit in 3 months but call sooner if needed     03/17/2022  Acute  ov/Allison Gross re: cough variant asthma  maint on symbicort  80 2bid   Chief Complaint  Patient presents with   Acute Visit    Increased SOB over the past few months- gets winded walking up stairs. Currently on abx for cellulitis.   Dyspnea:  onset summer 2023 not really progressive, does better p albuterol / no worse since L leg wound from car door Jan 23 2022  Cough: good control Sleeping: bed blocks one pillow  SABA use: avg twice weekly  02: none  Covid status:   vax max       No obvious day to day or daytime variability or assoc excess/ purulent sputum or mucus plugs or hemoptysis or cp or chest tightness, subjective wheeze or overt sinus or hb symptoms.   Sleeping  without nocturnal  or early am exacerbation  of respiratory  c/o's or need for noct saba. Also denies any obvious fluctuation of symptoms with weather or environmental changes or other aggravating or  alleviating factors except as outlined above   No unusual exposure hx or h/o childhood pna/ asthma or knowledge of premature birth.  Current Allergies, Complete Past Medical History, Past Surgical History, Family History, and Social History were reviewed in [employer] record.  ROS  The following are not active complaints unless bolded Hoarseness, sore throat, dysphagia, dental problems, itching, sneezing,  nasal congestion or discharge of excess mucus or purulent secretions, ear ache,   fever, chills, sweats, unintended wt loss or wt gain, classically pleuritic or exertional cp,  orthopnea pnd or arm/hand swelling  or leg swelling, presyncope, palpitations, abdominal pain, anorexia, nausea, vomiting, diarrhea  or change in bowel habits or change in bladder habits, change in stools or change in urine, dysuria, hematuria,  rash, arthralgias, visual complaints, headache, numbness, weakness or ataxia or problems with walking or coordination,  change in mood or  memory.  L leg wound/ wears boot         Current Meds  Medication Sig   albuterol (PROAIR HFA) 108 (90 Base) MCG/ACT inhaler INHALE 2 PUFFS EVERY 4 HOURS AS NEEDED FOR WHEEZING AND SHORTNESS OF BREATH.   BIOTIN PO Take 1 capsule by mouth daily.   budesonide-formoterol (SYMBICORT) 80-4.5 MCG/ACT inhaler INHALE 2 PUFFS FIRST THING IN THE MORNING AND 2 PUFFS 12 HOURS LATER   calcium-vitamin D (OSCAL WITH D) 500-200 MG-UNIT per tablet Take 1 tablet by mouth daily with breakfast.   carbamazepine (CARBATROL) 200 MG 12 hr capsule Take 1 capsule (200 mg total) by mouth 2 (two) times daily.   cefadroxil (DURICEF) 500 MG capsule Take 2 capsules (1,000 mg total) by mouth 2 (two) times daily for 14 days.   fexofenadine (ALLEGRA) 180 MG tablet Take 180 mg by mouth every morning.   Guaifenesin 1200 MG TB12 Take 1 tablet by mouth 2 (two) times daily as needed.   ibuprofen (ADVIL,MOTRIN) 400 MG tablet Take 400 mg by mouth every 8  (eight) hours as needed (joint pain).   ipratropium-albuterol (DUONEB) 0.5-2.5 (3) MG/3ML SOLN Take 3 mLs by nebulization every 4 (four) hours as needed (Wheezing/shortness of breath ((PLAN C))).   Lactobacillus (FLORAJEN ACIDOPHILUS PO) Take 1 tablet by mouth daily.   losartan-hydrochlorothiazide (HYZAAR) 50-12.5 MG per tablet Take 1 tablet by mouth every morning.    melatonin 5 MG TABS Take 5 mg by mouth at bedtime. Takes 10mg  total HS   Multiple Vitamin (MULTIVITAMIN) tablet Take 1 tablet by mouth every morning.   pantoprazole (PROTONIX) 40 MG tablet Take 30- 60 min before your first and  last meals of the day   potassium chloride (K-DUR) 10 MEQ tablet Take 1 tablet by mouth daily. ((2 tabs on Mondays and Fridays))   Respiratory Therapy Supplies (FLUTTER) DEVI As instructed   Turmeric 500 MG CAPS Take 1 capsule by mouth every morning.    vitamin C (ASCORBIC ACID) 500 MG tablet Take 500 mg by mouth every morning.              Past Medical History:  Asthma  - HFA 50% November 10, 2009 > 75% December 24, 2009 > 90% March 26, 2010  Osteopenia  - hold fosfamax 09/2009 >> better with active HB so rec Reclast rx         Objective:  Physical Exam  Wts  03/17/2022 155 05/05/2021   153  10/25/2018    158  02/21/2018    155  07/14/2012    158 = baseline /   11/13/2014 162 > 02/14/2015   159 >  06/09/2015  157 > 11/13/2015  156 > 05/17/2016  161 > 08/02/2016  161 >  12/16/2016  158 > 06/13/2017  160 > 12/14/2017  95%    Vital signs reviewed  03/17/2022  -  Note at rest 02 sats  98% on RA   General appearance:    amb pleasant wf nad   HEENT : Oropharynx  clear     Nasal turbinates nl    NECK :  without  apparent JVD/ palpable Nodes/TM    LUNGS: no acc muscle use,  Nl contour chest which is clear to A and P bilaterally without cough on insp or exp maneuvers   CV:  RRR  no s3 or murmur or increase in P2, and no edema   ABD:  soft and nontender with nl inspiratory excursion in the supine  position. No bruits or organomegaly appreciated   MS:  Nl gait/ ext warm with  mild STS LLE wearing L  Ankle support  s calf tenderness   SKIN: warm and dry without lesions    NEURO:  alert, approp, nl sensorium with  no motor or cerebellar deficits apparent.       CXR PA and Lateral:   03/17/2022 :    I personally reviewed images and impression is as follows:     Large HH/ no acute changes       Assessment:

## 2022-03-17 NOTE — Assessment & Plan Note (Signed)
-   Spirometry 05/17/16   FEV1 1.44  (72%)  Ratio 74   - Spirometry 06/13/2017  FEV1 1.34 (68%)  Ratio 73 p symbicort 80 x 2   - 06/13/2017  Walked RA x 3 laps @ 185 ft each stopped due to  End of study, nl pace, no desat, min sob, fast pace > rec regular ex/ complete cards eval planned, then cpst if not better to her satisfaction  - 03/17/2022   Walked on RA  x  3  lap(s) =  approx 750  ft  @ mod pace, stopped due to end of study with lowest 02 sats 95% and no sob    No clinical evidence of PE/ chf / anemia/ thyroid or metabolic cause for cough though did not do labs today at her request (pushed for time) so try double strength dulera and f/u w/in 2 weeks if no better  Each maintenance medication was reviewed in detail including emphasizing most importantly the difference between maintenance and prns and under what circumstances the prns are to be triggered using an action plan format where appropriate.  Total time for H and P, chart review, counseling,  directly observing portions of ambulatory 02 saturation study/ and generating customized AVS unique to this office visit / same day charting  > 30 min acute eval.

## 2022-03-23 ENCOUNTER — Telehealth: Payer: Self-pay

## 2022-03-23 ENCOUNTER — Ambulatory Visit (HOSPITAL_BASED_OUTPATIENT_CLINIC_OR_DEPARTMENT_OTHER): Payer: PRIVATE HEALTH INSURANCE | Admitting: Internal Medicine

## 2022-03-23 NOTE — Telephone Encounter (Signed)
PA for [facility] '200mg'$  has been APPROVED from 03/23/2022-03/23/2023.

## 2022-03-23 NOTE — Telephone Encounter (Signed)
PA request received via CMM through [place] [employer] for [facility] 200-5MCG/ACT aerosol  PA has been submitted and is awaiting determination.  KeyJeannette How - PA Case ID: [id number]

## 2022-03-24 ENCOUNTER — Encounter (HOSPITAL_BASED_OUTPATIENT_CLINIC_OR_DEPARTMENT_OTHER): Payer: Medicare Other | Attending: General Surgery | Admitting: General Surgery

## 2022-03-24 DIAGNOSIS — Z8249 Family history of ischemic heart disease and other diseases of the circulatory system: Secondary | ICD-10-CM | POA: Insufficient documentation

## 2022-03-24 DIAGNOSIS — W2209XA Striking against other stationary object, initial encounter: Secondary | ICD-10-CM | POA: Diagnosis not present

## 2022-03-24 DIAGNOSIS — L97822 Non-pressure chronic ulcer of other part of left lower leg with fat layer exposed: Secondary | ICD-10-CM | POA: Insufficient documentation

## 2022-03-24 DIAGNOSIS — I10 Essential (primary) hypertension: Secondary | ICD-10-CM | POA: Diagnosis not present

## 2022-03-25 NOTE — Progress Notes (Addendum)
MD FACS Entered By: Fredirick Maudlin on 04/06/2022 12:00:44 -------------------------------------------------------------------------------- Physical Exam Details Patient Name: Date of Service: Allison Gross 03/24/2022 10:15 A M Medical Record Number: [id number] Patient Account Number: [id number] Date of Birth/Sex: Treating RN: 04-23-1945 (76 y.o. F) Primary Care Provider: Tanda Rockers Other Clinician: Referring Provider: Treating Provider/Extender: Berniece Salines in Treatment: 1 Constitutional She is hypertensive, but asymptomatic.. . . . No acute distress. Respiratory Normal work of breathing on room air. Notes 03/24/2022: The wound is a bit smaller today and less painful. There is some slough on the surface. Edema control is improved. Electronic Signature(s) Allison Gross, Allison Gross ([id number]) [id number] Page 3 of 8 Signed: 03/24/2022 11:47:05 AM By: Fredirick Maudlin MD FACS Entered By: Fredirick Maudlin on 03/24/2022 11:47:04 -------------------------------------------------------------------------------- Physician Orders Details Patient Name: Date of Service: Allison Gross 03/24/2022 10:15 A M Medical Record Number: [id number] Patient Account Number: [id number] Date of Birth/Sex: Treating RN: 11-22-1945 (76 y.o. 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 - Dr. Celine Gross - Room 2 Return A Anesthetic Wound #1 Left,Lateral Lower Leg (In clinic) Topical Lidocaine 5% applied to wound bed Bathing/ Shower/ Hygiene May shower with protection but do not get wound dressing(s) wet. - purchase cast protector from [facility], [facility] or [employer] Edema Control - Lymphedema / SCD / Other Left Lower Extremity Elevate legs to the level of the heart or above for 30 minutes daily and/or when sitting, a frequency of: Avoid standing for long periods of time. Wound Treatment Wound #1 - Lower Leg Wound Laterality: Left, Lateral Cleanser: Soap and Water 1 x Per Week/30 Days Discharge Instructions: May shower and wash wound with dial antibacterial soap and water prior to dressing change. Peri-Wound Care: Sween Lotion (Moisturizing lotion) 1 x Per Week/30 Days Discharge Instructions: Apply moisturizing lotion as directed Prim Dressing: KerraCel Ag Gelling Fiber Dressing, 2x2 in (silver alginate) 1 x Per Week/30 Days ary Discharge Instructions: Apply silver alginate to wound bed as instructed Secondary Dressing: ABD Pad, 5x9 1 x Per Week/30 Days Discharge Instructions: Apply over primary dressing as directed. Compression Wrap: ThreePress (3 layer compression wrap) 1 x Per Week/30 Days Discharge Instructions: Apply three layer compression as directed. Electronic Signature(s) Signed: 03/24/2022 5:07:18 PM By: Fredirick Maudlin MD FACS Entered By: Fredirick Maudlin on 03/24/2022 11:47:20 Allison Gross ([id number]) [id number] Page 4 of 8 -------------------------------------------------------------------------------- Problem List Details Patient Name: Date of Service: Allison Gross 03/24/2022 10:15 A M Medical Record  Number: [id number] Patient Account Number: [id number] Date of Birth/Sex: Treating RN: 24-Nov-1945 (76 y.o. F) Primary Care Provider: Tanda Rockers Other Clinician: Referring Provider: Treating Provider/Extender: Berniece Salines in Treatment: 1 Active Problems ICD-10 Encounter Code Description Active Date MDM Diagnosis [phone] Non-pressure chronic ulcer of other part of left lower leg with fat layer 03/16/2022 No Yes exposed Crawford (primary) hypertension 03/16/2022 No Yes Inactive Problems Resolved Problems Electronic Signature(s) Signed: 03/24/2022 11:28:18 AM By: Fredirick Maudlin MD FACS Entered By: Fredirick Maudlin on 03/24/2022 11:28:18 -------------------------------------------------------------------------------- Progress Note Details Patient Name: Date of Service: Allison Gross 03/24/2022 10:15 A M Medical Record Number: [id number] Patient Account Number: [id number] Date of Birth/Sex: Treating RN:  There was a Selective/Open Wound Non-Viable Tissue Debridement with a total area of 5.04 sq cm performed by Fredirick Maudlin, MD. With the following instrument(s): Curette to remove Non-Viable tissue/material. Material removed includes Eschar and Slough and after achieving pain control using Lidocaine 5% topical ointment. No specimens were taken. A time out was conducted at 10:48, prior to the start of the procedure. A Minimum amount of bleeding was controlled with Pressure. The procedure was tolerated well with a pain level of 0 throughout and a pain level of 0 following the procedure. Post Debridement Measurements: 2.8cm length x 1.8cm width x 0.2cm depth; 0.792cm^3 volume. Character of Wound/Ulcer Post Debridement is improved. Post procedure Diagnosis Wound #1: Same as Pre-Procedure General Notes: Scribed for Dr. Celine Gross by Allison Gross. Pre-procedure  diagnosis of Wound #1 is a Trauma, Other located on the Left,Lateral Lower Leg . There was a Three Layer Compression Therapy Procedure by Allison Catholic, RN. Post procedure Diagnosis Wound #1: Same as Pre-Procedure Allison Gross, Allison Gross ([id number]) [id number] Page 6 of 8 Plan Follow-up Appointments: Return Appointment in 1 week. - Dr. Celine Gross - Room 2 Anesthetic: Wound #1 Left,Lateral Lower Leg: (In clinic) Topical Lidocaine 5% applied to wound bed Bathing/ Shower/ Hygiene: May shower with protection but do not get wound dressing(s) wet. - purchase cast protector from [facility], [facility] or [employer] Edema Control - Lymphedema / SCD / Other: Elevate legs to the level of the heart or above for 30 minutes daily and/or when sitting, a frequency of: Avoid standing for long periods of time. WOUND #1: - Lower Leg Wound Laterality: Left, Lateral Cleanser: Soap and Water 1 x Per Week/30 Days Discharge Instructions: May shower and wash wound with dial antibacterial soap and water prior to dressing change. Peri-Wound Care: Sween Lotion (Moisturizing lotion) 1 x Per Week/30 Days Discharge Instructions: Apply moisturizing lotion as directed Prim Dressing: KerraCel Ag Gelling Fiber Dressing, 2x2 in (silver alginate) 1 x Per Week/30 Days ary Discharge Instructions: Apply silver alginate to wound bed as instructed Secondary Dressing: ABD Pad, 5x9 1 x Per Week/30 Days Discharge Instructions: Apply over primary dressing as directed. Com pression Wrap: ThreePress (3 layer compression wrap) 1 x Per Week/30 Days Discharge Instructions: Apply three layer compression as directed. 03/24/2022: The wound is a bit smaller today and less painful. There is some slough on the surface. Edema control is improved. I used a curette to debride eschar and slough from the wound. We will continue silver alginate and 3 layer compression. Follow-up in 1 week. Electronic Signature(s) Signed: 04/06/2022 12:00:58  PM By: Fredirick Maudlin MD FACS Previous Signature: 03/24/2022 11:47:49 AM Version By: Fredirick Maudlin MD FACS Entered By: Fredirick Maudlin on 04/06/2022 12:00:58 -------------------------------------------------------------------------------- HxROS Details Patient Name: Date of Service: Allison Gross 03/24/2022 10:15 A M Medical Record Number: [id number] Patient Account Number: [id number] Date of Birth/Sex: Treating RN: [birth year] (76 y.o. F) Primary Care Provider: Tanda Rockers Other Clinician: Referring Provider: Treating Provider/Extender: Berniece Salines in Treatment: 1 Information Obtained From Patient Respiratory Medical History: Positive for: Asthma Cardiovascular Medical History: Positive for: Hypertension Gastrointestinal Medical History: Past Medical History Notes: reflux Neurologic Medical History: Positive for: Seizure Disorder - petit mal seizures Allison Gross, Allison Gross ([id number]) [id number] Page 7 of 8 Immunizations Pneumococcal Vaccine: Received Pneumococcal Vaccination: Yes Received Pneumococcal Vaccination On or After 60th Birthday: Yes Implantable Devices None Hospitalization / Surgery History Type of Hospitalization/Surgery cataract extraction 2022 oophorectomy 2005 cholecystectomy hernia repair mohs surgery hysterectomy Family and Social History Cancer: Yes - Maternal  There was a Selective/Open Wound Non-Viable Tissue Debridement with a total area of 5.04 sq cm performed by Fredirick Maudlin, MD. With the following instrument(s): Curette to remove Non-Viable tissue/material. Material removed includes Eschar and Slough and after achieving pain control using Lidocaine 5% topical ointment. No specimens were taken. A time out was conducted at 10:48, prior to the start of the procedure. A Minimum amount of bleeding was controlled with Pressure. The procedure was tolerated well with a pain level of 0 throughout and a pain level of 0 following the procedure. Post Debridement Measurements: 2.8cm length x 1.8cm width x 0.2cm depth; 0.792cm^3 volume. Character of Wound/Ulcer Post Debridement is improved. Post procedure Diagnosis Wound #1: Same as Pre-Procedure General Notes: Scribed for Dr. Celine Gross by Allison Gross. Pre-procedure  diagnosis of Wound #1 is a Trauma, Other located on the Left,Lateral Lower Leg . There was a Three Layer Compression Therapy Procedure by Allison Catholic, RN. Post procedure Diagnosis Wound #1: Same as Pre-Procedure Allison Gross, Allison Gross ([id number]) [id number] Page 6 of 8 Plan Follow-up Appointments: Return Appointment in 1 week. - Dr. Celine Gross - Room 2 Anesthetic: Wound #1 Left,Lateral Lower Leg: (In clinic) Topical Lidocaine 5% applied to wound bed Bathing/ Shower/ Hygiene: May shower with protection but do not get wound dressing(s) wet. - purchase cast protector from [facility], [facility] or [employer] Edema Control - Lymphedema / SCD / Other: Elevate legs to the level of the heart or above for 30 minutes daily and/or when sitting, a frequency of: Avoid standing for long periods of time. WOUND #1: - Lower Leg Wound Laterality: Left, Lateral Cleanser: Soap and Water 1 x Per Week/30 Days Discharge Instructions: May shower and wash wound with dial antibacterial soap and water prior to dressing change. Peri-Wound Care: Sween Lotion (Moisturizing lotion) 1 x Per Week/30 Days Discharge Instructions: Apply moisturizing lotion as directed Prim Dressing: KerraCel Ag Gelling Fiber Dressing, 2x2 in (silver alginate) 1 x Per Week/30 Days ary Discharge Instructions: Apply silver alginate to wound bed as instructed Secondary Dressing: ABD Pad, 5x9 1 x Per Week/30 Days Discharge Instructions: Apply over primary dressing as directed. Com pression Wrap: ThreePress (3 layer compression wrap) 1 x Per Week/30 Days Discharge Instructions: Apply three layer compression as directed. 03/24/2022: The wound is a bit smaller today and less painful. There is some slough on the surface. Edema control is improved. I used a curette to debride eschar and slough from the wound. We will continue silver alginate and 3 layer compression. Follow-up in 1 week. Electronic Signature(s) Signed: 04/06/2022 12:00:58  PM By: Fredirick Maudlin MD FACS Previous Signature: 03/24/2022 11:47:49 AM Version By: Fredirick Maudlin MD FACS Entered By: Fredirick Maudlin on 04/06/2022 12:00:58 -------------------------------------------------------------------------------- HxROS Details Patient Name: Date of Service: Allison Gross 03/24/2022 10:15 A M Medical Record Number: [id number] Patient Account Number: [id number] Date of Birth/Sex: Treating RN: [birth year] (76 y.o. F) Primary Care Provider: Tanda Rockers Other Clinician: Referring Provider: Treating Provider/Extender: Berniece Salines in Treatment: 1 Information Obtained From Patient Respiratory Medical History: Positive for: Asthma Cardiovascular Medical History: Positive for: Hypertension Gastrointestinal Medical History: Past Medical History Notes: reflux Neurologic Medical History: Positive for: Seizure Disorder - petit mal seizures Allison Gross, Allison Gross ([id number]) [id number] Page 7 of 8 Immunizations Pneumococcal Vaccine: Received Pneumococcal Vaccination: Yes Received Pneumococcal Vaccination On or After 60th Birthday: Yes Implantable Devices None Hospitalization / Surgery History Type of Hospitalization/Surgery cataract extraction 2022 oophorectomy 2005 cholecystectomy hernia repair mohs surgery hysterectomy Family and Social History Cancer: Yes - Maternal  Grandparents,Paternal Grandparents; Diabetes: Yes; Heart Disease: Yes - Maternal Grandparents; Hypertension: Yes; Seizures: No; Stroke: No; Thyroid Problems: No; Tuberculosis: No; Never smoker; Marital Status - Married; Alcohol Use: Rarely; Drug Use: No History; Caffeine Use: Daily; Financial Concerns: No; Food, Clothing or Shelter Needs: No; Support System Lacking: No; Transportation Concerns: No Engineer, maintenance) Signed: 03/24/2022 5:07:18 PM By: Fredirick Maudlin MD FACS Entered By: Fredirick Maudlin on 03/24/2022  11:46:33 -------------------------------------------------------------------------------- SuperBill Details Patient Name: Date of Service: Allison Gross 03/24/2022 Medical Record Number: [id number] Patient Account Number: [id number] Date of Birth/Sex: Treating RN: 09-Aug-1945 (76 y.o. 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 Essential (primary) hypertension Facility Procedures : CPT4 Code: [id number] Description: 63943 - DEBRIDE WOUND 1ST 20 SQ CM OR < ICD-10 Diagnosis Description L97.822 Non-pressure chronic ulcer of other part of left lower leg with fat layer expose Modifier: d Quantity: 1 Physician Procedures : CPT4 Code Description Modifier [id number] - WC PHYS LEVEL 3 - EST PT 25 ICD-10 Diagnosis Description L97.822 Non-pressure chronic ulcer of other part of left lower leg with fat layer exposed I10 Essential (primary) hypertension Quantity: 1 : [id number] - WC PHYS DEBR WO ANESTH 20 SQ CM ICD-10 Diagnosis Description L97.822 Non-pressure chronic ulcer of other part of left lower leg with fat layer exposed Quantity: 1 Electronic Signature(s) Allison Gross, Allison Gross ([id number]) [id number] Page 8 of 8 Signed: 03/24/2022 11:48:04 AM By: Fredirick Maudlin MD FACS Entered By: Fredirick Maudlin on 03/24/2022 11:48:04  There was a Selective/Open Wound Non-Viable Tissue Debridement with a total area of 5.04 sq cm performed by Fredirick Maudlin, MD. With the following instrument(s): Curette to remove Non-Viable tissue/material. Material removed includes Eschar and Slough and after achieving pain control using Lidocaine 5% topical ointment. No specimens were taken. A time out was conducted at 10:48, prior to the start of the procedure. A Minimum amount of bleeding was controlled with Pressure. The procedure was tolerated well with a pain level of 0 throughout and a pain level of 0 following the procedure. Post Debridement Measurements: 2.8cm length x 1.8cm width x 0.2cm depth; 0.792cm^3 volume. Character of Wound/Ulcer Post Debridement is improved. Post procedure Diagnosis Wound #1: Same as Pre-Procedure General Notes: Scribed for Dr. Celine Gross by Allison Gross. Pre-procedure  diagnosis of Wound #1 is a Trauma, Other located on the Left,Lateral Lower Leg . There was a Three Layer Compression Therapy Procedure by Allison Catholic, RN. Post procedure Diagnosis Wound #1: Same as Pre-Procedure Allison Gross, Allison Gross ([id number]) [id number] Page 6 of 8 Plan Follow-up Appointments: Return Appointment in 1 week. - Dr. Celine Gross - Room 2 Anesthetic: Wound #1 Left,Lateral Lower Leg: (In clinic) Topical Lidocaine 5% applied to wound bed Bathing/ Shower/ Hygiene: May shower with protection but do not get wound dressing(s) wet. - purchase cast protector from [facility], [facility] or [employer] Edema Control - Lymphedema / SCD / Other: Elevate legs to the level of the heart or above for 30 minutes daily and/or when sitting, a frequency of: Avoid standing for long periods of time. WOUND #1: - Lower Leg Wound Laterality: Left, Lateral Cleanser: Soap and Water 1 x Per Week/30 Days Discharge Instructions: May shower and wash wound with dial antibacterial soap and water prior to dressing change. Peri-Wound Care: Sween Lotion (Moisturizing lotion) 1 x Per Week/30 Days Discharge Instructions: Apply moisturizing lotion as directed Prim Dressing: KerraCel Ag Gelling Fiber Dressing, 2x2 in (silver alginate) 1 x Per Week/30 Days ary Discharge Instructions: Apply silver alginate to wound bed as instructed Secondary Dressing: ABD Pad, 5x9 1 x Per Week/30 Days Discharge Instructions: Apply over primary dressing as directed. Com pression Wrap: ThreePress (3 layer compression wrap) 1 x Per Week/30 Days Discharge Instructions: Apply three layer compression as directed. 03/24/2022: The wound is a bit smaller today and less painful. There is some slough on the surface. Edema control is improved. I used a curette to debride eschar and slough from the wound. We will continue silver alginate and 3 layer compression. Follow-up in 1 week. Electronic Signature(s) Signed: 04/06/2022 12:00:58  PM By: Fredirick Maudlin MD FACS Previous Signature: 03/24/2022 11:47:49 AM Version By: Fredirick Maudlin MD FACS Entered By: Fredirick Maudlin on 04/06/2022 12:00:58 -------------------------------------------------------------------------------- HxROS Details Patient Name: Date of Service: Allison Gross 03/24/2022 10:15 A M Medical Record Number: [id number] Patient Account Number: [id number] Date of Birth/Sex: Treating RN: [birth year] (76 y.o. F) Primary Care Provider: Tanda Rockers Other Clinician: Referring Provider: Treating Provider/Extender: Berniece Salines in Treatment: 1 Information Obtained From Patient Respiratory Medical History: Positive for: Asthma Cardiovascular Medical History: Positive for: Hypertension Gastrointestinal Medical History: Past Medical History Notes: reflux Neurologic Medical History: Positive for: Seizure Disorder - petit mal seizures Allison Gross, Allison Gross ([id number]) [id number] Page 7 of 8 Immunizations Pneumococcal Vaccine: Received Pneumococcal Vaccination: Yes Received Pneumococcal Vaccination On or After 60th Birthday: Yes Implantable Devices None Hospitalization / Surgery History Type of Hospitalization/Surgery cataract extraction 2022 oophorectomy 2005 cholecystectomy hernia repair mohs surgery hysterectomy Family and Social History Cancer: Yes - Maternal

## 2022-03-25 NOTE — Progress Notes (Signed)
Allison Gross/Extender: Berniece Salines in Treatment: 1 Vital Signs Height(in): 60 Pulse(bpm): 4 Weight(lbs): 154 Blood Pressure(mmHg): 157/67 Body Mass Index(BMI): 30.1 Temperature(F): 97.8 Respiratory Rate(breaths/min): 20 Wound Assessments Wound Number: 1 N/A N/A Photos: N/A N/A Left, Lateral Lower Leg N/A N/A Wound Location: Trauma N/A N/A Wounding Event: Trauma, Other N/A N/A Primary Etiology: Asthma, Hypertension, Seizure N/A N/A Comorbid History: Disorder 01/23/2022 N/A N/A Date Acquired: 1 N/A N/A Weeks of Treatment: Open N/A N/A Wound Status: No N/A N/A Wound Recurrence: 2.8x1.8x0.2 N/A N/A Measurements L x W x D (cm) 3.958 N/A N/A A (cm) : rea 0.792 N/A N/A Volume (cm) : 27.00% N/A N/A % Reduction in A rea: 51.30% N/A N/A % Reduction in Volume: Full Thickness Without Exposed N/A N/A Classification: Support Structures Medium N/A N/A Exudate A mount: Serosanguineous N/A N/A Exudate Type: red, brown N/A N/A Exudate Color: Large (67-100%) N/A N/A Granulation A mount: Red N/A N/A Granulation Quality: Small (1-33%) N/A  N/A Necrotic A mount: Fat Layer (Subcutaneous Tissue): Yes N/A N/A Exposed Structures: Fascia: No Tendon: No Muscle: No Joint: No Bone: No None N/A N/A Epithelialization: Debridement - Selective/Open Wound N/A N/A Debridement: Pre-procedure Verification/Time Out 10:48 N/A N/A Taken: Lidocaine 5% topical ointment N/A N/A Pain Control: Necrotic/Eschar, Slough N/A N/A Tissue Debrided: Non-Viable Tissue N/A N/A Level: 5.04 N/A N/A Debridement A (sq cm): rea Curette N/A N/A Instrument: Minimum N/A N/A Bleeding: Pressure N/A N/A Hemostasis A chieved: 0 N/A N/A Procedural Pain: 0 N/A N/A Post Procedural Pain: Procedure was tolerated well N/A N/A Debridement Treatment Response: 2.8x1.8x0.2 N/A N/A Post Debridement Measurements L x W x D (cm) 0.792 N/A N/A Post Debridement Volume: (cm) No Abnormalities Noted N/A N/A Periwound Skin Texture: No Abnormalities Noted N/A N/A Periwound Skin Moisture: Erythema: Yes N/A N/A Periwound Skin Color: Circumferential N/A N/A Erythema Location: No Abnormality N/A N/A Temperature: Debridement N/A N/A Procedures Performed: Treatment Notes Allison Gross, Allison Gross ([id number]) [id number] Page 4 of 7 Electronic Signature(s) Signed: 03/24/2022 11:28:24 AM By: Fredirick Maudlin MD FACS Entered By: Fredirick Maudlin on 03/24/2022 54:27:06 -------------------------------------------------------------------------------- Multi-Disciplinary Care Plan Details Patient Name: Date of Service: Allison Gross 03/24/2022 10:15 A M Medical Record Number: [id number] Patient Account Number: [id number] Date of Birth/Sex: Treating RN: November 06, 1945 (76 y.o. America Brown Primary Care Destenee Guerry: Tanda Rockers Other Clinician: Referring Aletta Edmunds: Treating Letonia Stead/Extender: Berniece Salines in Treatment: 1 Active Inactive Orientation to the Wound Care Program Nursing Diagnoses: Knowledge deficit  related to the wound healing center program Goals: Patient/caregiver will verbalize understanding of the [facility] Date Initiated: 03/16/2022 Target Resolution Date: 06/17/2022 Goal Status: Active Interventions: Provide education on orientation to the wound center Notes: Wound/Skin Impairment Nursing Diagnoses: Impaired tissue integrity Knowledge deficit related to ulceration/compromised skin integrity Goals: Patient/caregiver will verbalize understanding of skin care regimen Date Initiated: 03/16/2022 Target Resolution Date: 06/17/2022 Goal Status: Active Ulcer/skin breakdown will have a volume reduction of 30% by week 4 Date Initiated: 03/16/2022 Target Resolution Date: 06/17/2022 Goal Status: Active Interventions: Assess patient/caregiver ability to obtain necessary supplies Assess patient/caregiver ability to perform ulcer/skin care regimen upon admission and as needed Assess ulceration(s) every visit Provide education on ulcer and skin care Notes: Electronic Signature(s) Signed: 03/24/2022 4:49:54 PM By: Dellie Catholic RN Entered By: Dellie Catholic on 03/24/2022 16:45:20 Pain Assessment Details -------------------------------------------------------------------------------- Allison Gross ([id number]) [id number] Page 5 of 7 Patient Name: Date of Service: Allison Gross 03/24/2022 10:15 A M Medical Record Number: [id number] Patient Account  Number: [id number] Date of Birth/Sex: Treating RN: Nov 18, 1945 (76 y.o. F) Primary Care Gilmer Kaminsky: Tanda Rockers Other Clinician: Referring Baruc Tugwell: Treating Elliannah Wayment/Extender: Berniece Salines in Treatment: 1 Active Problems Location of Pain Severity and Description of Pain Patient Has Paino No Site Locations Pain Management and Medication Current Pain Management: Electronic Signature(s) Signed: 03/24/2022 10:44:51 AM By: Worthy Rancher Entered By: Worthy Rancher  on 03/24/2022 10:27:21 -------------------------------------------------------------------------------- Patient/Caregiver Education Details Patient Name: Date of Service: Allison Gross 12/6/2023andnbsp10:15 A M Medical Record Number: [id number] Patient Account Number: [id number] Date of Birth/Gender: Treating RN: 03-14-1946 (76 y.o. America Brown Primary Care Physician: Tanda Rockers Other Clinician: Referring Physician: Treating Physician/Extender: Berniece Salines in Treatment: 1 Education Assessment Education Provided To: Patient Education Topics Provided Wound/Skin Impairment: Methods: Explain/Verbal Responses: Return demonstration correctly Electronic Signature(s) Signed: 03/24/2022 4:49:54 PM By: Dellie Catholic RN Entered By: Dellie Catholic on 03/24/2022 16:46:03 Allison Gross, Allison Gross ([id number]) [id number] Page 6 of 7 -------------------------------------------------------------------------------- Wound Assessment Details Patient Name: Date of Service: Allison Gross 03/24/2022 10:15 A M Medical Record Number: [id number] Patient Account Number: [id number] Date of Birth/Sex: Treating RN: Jul 28, 1945 (76 y.o. F) Primary Care Joslynn Jamroz: Tanda Rockers Other Clinician: Referring Alben Jepsen: Treating Lesley Galentine/Extender: Berniece Salines in Treatment: 1 Wound Status Wound Number: 1 Primary Etiology: Trauma, Other Wound Location: Left, Lateral Lower Leg Wound Status: Open Wounding Event: Trauma Comorbid History: Asthma, Hypertension, Seizure Disorder Date Acquired: 01/23/2022 Weeks Of Treatment: 1 Clustered Wound: No Photos Wound Measurements Length: (cm) 2.8 Width: (cm) 1.8 Depth: (cm) 0.2 Area: (cm) 3.958 Volume: (cm) 0.792 % Reduction in Area: 27% % Reduction in Volume: 51.3% Epithelialization: None Tunneling: No Undermining: No Wound Description Classification: Full Thickness  Without Exposed Suppor Exudate Amount: Medium Exudate Type: Serosanguineous Exudate Color: red, brown t Structures Foul Odor After Cleansing: No Slough/Fibrino Yes Wound Bed Granulation Amount: Large (67-100%) Exposed Structure Granulation Quality: Red Fascia Exposed: No Necrotic Amount: Small (1-33%) Fat Layer (Subcutaneous Tissue) Exposed: Yes Necrotic Quality: Adherent Slough Tendon Exposed: No Muscle Exposed: No Joint Exposed: No Bone Exposed: No Periwound Skin Texture Texture Color No Abnormalities Noted: Yes No Abnormalities Noted: No Erythema: Yes Moisture Erythema Location: Circumferential No Abnormalities Noted: Yes Temperature / Pain Temperature: No Abnormality Treatment Notes Wound #1 (Lower Leg) Wound Laterality: Left, Lateral Cleanser Soap and Water Allison Gross, Allison Gross ([id number]) [phone].pdf Page 7 of 7 Discharge Instruction: May shower and wash wound with dial antibacterial soap and water prior to dressing change. Peri-Wound Care Sween Lotion (Moisturizing lotion) Discharge Instruction: Apply moisturizing lotion as directed Topical Primary Dressing KerraCel Ag Gelling Fiber Dressing, 2x2 in (silver alginate) Discharge Instruction: Apply silver alginate to wound bed as instructed Secondary Dressing ABD Pad, 5x9 Discharge Instruction: Apply over primary dressing as directed. Secured With Compression Wrap ThreePress (3 layer compression wrap) Discharge Instruction: Apply three layer compression as directed. Compression Stockings Add-Ons Electronic Signature(s) Signed: 03/24/2022 4:49:54 PM By: Dellie Catholic RN Entered By: Dellie Catholic on 03/24/2022 10:36:45 -------------------------------------------------------------------------------- Vitals Details Patient Name: Date of Service: Allison Gross 03/24/2022 10:15 A M Medical Record Number: [id number] Patient Account Number: [id number] Date of Birth/Sex: Treating  RN: 1946-03-04 (76 y.o. F) Primary Care Floree Zuniga: Tanda Rockers Other Clinician: Referring Darely Becknell: Treating Aleyah Balik/Extender: Berniece Salines in Treatment: 1 Vital Signs Time Taken: 10:25 Temperature (F): 97.8 Height (in): 60 Pulse (bpm): 74 Weight (lbs): 154 Respiratory Rate (breaths/min): 20 Body Mass Index (BMI): 30.1 Blood Pressure (  Allison Gross/Extender: Berniece Salines in Treatment: 1 Vital Signs Height(in): 60 Pulse(bpm): 4 Weight(lbs): 154 Blood Pressure(mmHg): 157/67 Body Mass Index(BMI): 30.1 Temperature(F): 97.8 Respiratory Rate(breaths/min): 20 Wound Assessments Wound Number: 1 N/A N/A Photos: N/A N/A Left, Lateral Lower Leg N/A N/A Wound Location: Trauma N/A N/A Wounding Event: Trauma, Other N/A N/A Primary Etiology: Asthma, Hypertension, Seizure N/A N/A Comorbid History: Disorder 01/23/2022 N/A N/A Date Acquired: 1 N/A N/A Weeks of Treatment: Open N/A N/A Wound Status: No N/A N/A Wound Recurrence: 2.8x1.8x0.2 N/A N/A Measurements L x W x D (cm) 3.958 N/A N/A A (cm) : rea 0.792 N/A N/A Volume (cm) : 27.00% N/A N/A % Reduction in A rea: 51.30% N/A N/A % Reduction in Volume: Full Thickness Without Exposed N/A N/A Classification: Support Structures Medium N/A N/A Exudate A mount: Serosanguineous N/A N/A Exudate Type: red, brown N/A N/A Exudate Color: Large (67-100%) N/A N/A Granulation A mount: Red N/A N/A Granulation Quality: Small (1-33%) N/A  N/A Necrotic A mount: Fat Layer (Subcutaneous Tissue): Yes N/A N/A Exposed Structures: Fascia: No Tendon: No Muscle: No Joint: No Bone: No None N/A N/A Epithelialization: Debridement - Selective/Open Wound N/A N/A Debridement: Pre-procedure Verification/Time Out 10:48 N/A N/A Taken: Lidocaine 5% topical ointment N/A N/A Pain Control: Necrotic/Eschar, Slough N/A N/A Tissue Debrided: Non-Viable Tissue N/A N/A Level: 5.04 N/A N/A Debridement A (sq cm): rea Curette N/A N/A Instrument: Minimum N/A N/A Bleeding: Pressure N/A N/A Hemostasis A chieved: 0 N/A N/A Procedural Pain: 0 N/A N/A Post Procedural Pain: Procedure was tolerated well N/A N/A Debridement Treatment Response: 2.8x1.8x0.2 N/A N/A Post Debridement Measurements L x W x D (cm) 0.792 N/A N/A Post Debridement Volume: (cm) No Abnormalities Noted N/A N/A Periwound Skin Texture: No Abnormalities Noted N/A N/A Periwound Skin Moisture: Erythema: Yes N/A N/A Periwound Skin Color: Circumferential N/A N/A Erythema Location: No Abnormality N/A N/A Temperature: Debridement N/A N/A Procedures Performed: Treatment Notes Allison Gross, Allison Gross ([id number]) [id number] Page 4 of 7 Electronic Signature(s) Signed: 03/24/2022 11:28:24 AM By: Fredirick Maudlin MD FACS Entered By: Fredirick Maudlin on 03/24/2022 54:27:06 -------------------------------------------------------------------------------- Multi-Disciplinary Care Plan Details Patient Name: Date of Service: Allison Gross 03/24/2022 10:15 A M Medical Record Number: [id number] Patient Account Number: [id number] Date of Birth/Sex: Treating RN: November 06, 1945 (76 y.o. America Brown Primary Care Destenee Guerry: Tanda Rockers Other Clinician: Referring Aletta Edmunds: Treating Letonia Stead/Extender: Berniece Salines in Treatment: 1 Active Inactive Orientation to the Wound Care Program Nursing Diagnoses: Knowledge deficit  related to the wound healing center program Goals: Patient/caregiver will verbalize understanding of the [facility] Date Initiated: 03/16/2022 Target Resolution Date: 06/17/2022 Goal Status: Active Interventions: Provide education on orientation to the wound center Notes: Wound/Skin Impairment Nursing Diagnoses: Impaired tissue integrity Knowledge deficit related to ulceration/compromised skin integrity Goals: Patient/caregiver will verbalize understanding of skin care regimen Date Initiated: 03/16/2022 Target Resolution Date: 06/17/2022 Goal Status: Active Ulcer/skin breakdown will have a volume reduction of 30% by week 4 Date Initiated: 03/16/2022 Target Resolution Date: 06/17/2022 Goal Status: Active Interventions: Assess patient/caregiver ability to obtain necessary supplies Assess patient/caregiver ability to perform ulcer/skin care regimen upon admission and as needed Assess ulceration(s) every visit Provide education on ulcer and skin care Notes: Electronic Signature(s) Signed: 03/24/2022 4:49:54 PM By: Dellie Catholic RN Entered By: Dellie Catholic on 03/24/2022 16:45:20 Pain Assessment Details -------------------------------------------------------------------------------- Allison Gross ([id number]) [id number] Page 5 of 7 Patient Name: Date of Service: Allison Gross 03/24/2022 10:15 A M Medical Record Number: [id number] Patient Account  Number: [id number] Date of Birth/Sex: Treating RN: Nov 18, 1945 (76 y.o. F) Primary Care Gilmer Kaminsky: Tanda Rockers Other Clinician: Referring Baruc Tugwell: Treating Elliannah Wayment/Extender: Berniece Salines in Treatment: 1 Active Problems Location of Pain Severity and Description of Pain Patient Has Paino No Site Locations Pain Management and Medication Current Pain Management: Electronic Signature(s) Signed: 03/24/2022 10:44:51 AM By: Worthy Rancher Entered By: Worthy Rancher  on 03/24/2022 10:27:21 -------------------------------------------------------------------------------- Patient/Caregiver Education Details Patient Name: Date of Service: Allison Gross 12/6/2023andnbsp10:15 A M Medical Record Number: [id number] Patient Account Number: [id number] Date of Birth/Gender: Treating RN: 03-14-1946 (76 y.o. America Brown Primary Care Physician: Tanda Rockers Other Clinician: Referring Physician: Treating Physician/Extender: Berniece Salines in Treatment: 1 Education Assessment Education Provided To: Patient Education Topics Provided Wound/Skin Impairment: Methods: Explain/Verbal Responses: Return demonstration correctly Electronic Signature(s) Signed: 03/24/2022 4:49:54 PM By: Dellie Catholic RN Entered By: Dellie Catholic on 03/24/2022 16:46:03 Allison Gross, Allison Gross ([id number]) [id number] Page 6 of 7 -------------------------------------------------------------------------------- Wound Assessment Details Patient Name: Date of Service: Allison Gross 03/24/2022 10:15 A M Medical Record Number: [id number] Patient Account Number: [id number] Date of Birth/Sex: Treating RN: Jul 28, 1945 (76 y.o. F) Primary Care Joslynn Jamroz: Tanda Rockers Other Clinician: Referring Alben Jepsen: Treating Lesley Galentine/Extender: Berniece Salines in Treatment: 1 Wound Status Wound Number: 1 Primary Etiology: Trauma, Other Wound Location: Left, Lateral Lower Leg Wound Status: Open Wounding Event: Trauma Comorbid History: Asthma, Hypertension, Seizure Disorder Date Acquired: 01/23/2022 Weeks Of Treatment: 1 Clustered Wound: No Photos Wound Measurements Length: (cm) 2.8 Width: (cm) 1.8 Depth: (cm) 0.2 Area: (cm) 3.958 Volume: (cm) 0.792 % Reduction in Area: 27% % Reduction in Volume: 51.3% Epithelialization: None Tunneling: No Undermining: No Wound Description Classification: Full Thickness  Without Exposed Suppor Exudate Amount: Medium Exudate Type: Serosanguineous Exudate Color: red, brown t Structures Foul Odor After Cleansing: No Slough/Fibrino Yes Wound Bed Granulation Amount: Large (67-100%) Exposed Structure Granulation Quality: Red Fascia Exposed: No Necrotic Amount: Small (1-33%) Fat Layer (Subcutaneous Tissue) Exposed: Yes Necrotic Quality: Adherent Slough Tendon Exposed: No Muscle Exposed: No Joint Exposed: No Bone Exposed: No Periwound Skin Texture Texture Color No Abnormalities Noted: Yes No Abnormalities Noted: No Erythema: Yes Moisture Erythema Location: Circumferential No Abnormalities Noted: Yes Temperature / Pain Temperature: No Abnormality Treatment Notes Wound #1 (Lower Leg) Wound Laterality: Left, Lateral Cleanser Soap and Water Allison Gross, Allison Gross ([id number]) [phone].pdf Page 7 of 7 Discharge Instruction: May shower and wash wound with dial antibacterial soap and water prior to dressing change. Peri-Wound Care Sween Lotion (Moisturizing lotion) Discharge Instruction: Apply moisturizing lotion as directed Topical Primary Dressing KerraCel Ag Gelling Fiber Dressing, 2x2 in (silver alginate) Discharge Instruction: Apply silver alginate to wound bed as instructed Secondary Dressing ABD Pad, 5x9 Discharge Instruction: Apply over primary dressing as directed. Secured With Compression Wrap ThreePress (3 layer compression wrap) Discharge Instruction: Apply three layer compression as directed. Compression Stockings Add-Ons Electronic Signature(s) Signed: 03/24/2022 4:49:54 PM By: Dellie Catholic RN Entered By: Dellie Catholic on 03/24/2022 10:36:45 -------------------------------------------------------------------------------- Vitals Details Patient Name: Date of Service: Allison Gross 03/24/2022 10:15 A M Medical Record Number: [id number] Patient Account Number: [id number] Date of Birth/Sex: Treating  RN: 1946-03-04 (76 y.o. F) Primary Care Floree Zuniga: Tanda Rockers Other Clinician: Referring Darely Becknell: Treating Aleyah Balik/Extender: Berniece Salines in Treatment: 1 Vital Signs Time Taken: 10:25 Temperature (F): 97.8 Height (in): 60 Pulse (bpm): 74 Weight (lbs): 154 Respiratory Rate (breaths/min): 20 Body Mass Index (BMI): 30.1 Blood Pressure (

## 2022-03-31 ENCOUNTER — Encounter (HOSPITAL_BASED_OUTPATIENT_CLINIC_OR_DEPARTMENT_OTHER): Payer: Medicare Other | Admitting: Internal Medicine

## 2022-03-31 DIAGNOSIS — L97822 Non-pressure chronic ulcer of other part of left lower leg with fat layer exposed: Secondary | ICD-10-CM | POA: Diagnosis not present

## 2022-03-31 NOTE — Telephone Encounter (Signed)
Approval letter received and indexed to patient documents.

## 2022-04-01 NOTE — Progress Notes (Addendum)
Allison, Gross ([id number]) [id number] Page 1 of 6 Visit Report for 03/31/2022 Debridement Details Patient Name: Date of Service: Allison Gross DINE 03/31/2022 8:30 A M Medical Record Number: [id number] Patient Account Number: [id number] Date of Birth/Sex: Treating RN: March 29, 1946 (76 y.o. F) Primary Care Provider: Nyoka Gross Other Clinician: Referring Provider: Treating Provider/Extender: Allison Gross in Treatment: 2 Debridement Performed for Assessment: Wound #1 Left,Lateral Lower Leg Performed By: Physician Maxwell Caul., MD Debridement Type: Debridement Level of Consciousness (Pre-procedure): Awake and Alert Pre-procedure Verification/Time Out Yes - 08:46 Taken: Start Time: 08:46 Pain Control: Lidocaine 5% topical ointment T Area Debrided (L x W): otal 2.7 (cm) x 1.7 (cm) = 4.59 (cm) Tissue and other material debrided: Non-Viable, Slough, Slough Level: Non-Viable Tissue Debridement Description: Selective/Open Wound Instrument: Curette Bleeding: Minimum Hemostasis Achieved: Pressure Response to Treatment: Procedure was tolerated well Level of Consciousness (Post- Awake and Alert procedure): Post Debridement Measurements of Total Wound Length: (cm) 2.7 Width: (cm) 1.7 Depth: (cm) 0.2 Volume: (cm) 0.721 Character of Wound/Ulcer Post Debridement: Improved Post Procedure Diagnosis Same as Pre-procedure Notes scribed for Dr. Leanord Gross by Samuella Bruin, RN Electronic Signature(s) Signed: 03/31/2022 5:47:10 PM By: Baltazar Najjar MD Entered By: Baltazar Najjar on 03/31/2022 09:06:42 -------------------------------------------------------------------------------- HPI Details Patient Name: Date of Service: Allison Gross DINE 03/31/2022 8:30 A M Medical Record Number: [id number] Patient Account Number: [id number] Date of Birth/Sex: Treating RN: 06-27-1945 (76 y.o. F) Primary Care Provider: Nyoka Gross  Other Clinician: Referring Provider: Treating Provider/Extender: Allison Gross in Treatment: 2 History of Present Illness HPI Description: ADMISSION Allison, Gross ([id number]) [id number] Page 2 of 6 03/16/2022 This is a 76 year old woman who struck her leg on a car door near the beginning of October. She was seen at an urgent care in [place], [place] and given a course of doxycycline. Her symptoms fail to improve and she saw a different urgent care provider who gave a 10-day course of Keflex without improvement. She presented to the emergency department on March 03, 2022 with concern for cellulitis and a wound infection. Due to swelling in the legs, a DVT scan was performed which was negative. She was given an injection of dalbavancin and referred to infectious disease. She saw ID on November 21. She was prescribed a 10-day course of cefadroxil and referred to the wound care center for further evaluation and management. ABI in clinic today was 1.2. On her left lower leg, there is a wound just lateral to the anterior tibial surface. There is some rubbery slough accumulation. She has 2+ pitting edema. No purulent drainage or malodor. 03/24/2022: The wound is a bit smaller today and less painful. There is some slough on the surface. Edema control is improved. 12/13; the wound is 80% covered in adherent slough. This requires debridement. Using silver alginate and 3 layer. Her edema control is good Psychologist, [facility]) Signed: 04/06/2022 12:01:42 PM By: Allison Guess MD FACS Signed: 06/07/2022 3:52:16 PM By: Baltazar Najjar MD Previous Signature: 03/31/2022 5:47:10 PM Version By: Baltazar Najjar MD Entered By: Allison Gross on 04/06/2022 12:01:42 -------------------------------------------------------------------------------- Physical Exam Details Patient Name: Date of Service: Allison Gross DINE 03/31/2022 8:30 A M Medical  Record Number: [id number] Patient Account Number: [id number] Date of Birth/Sex: Treating RN: Sep 19, 1945 (76 y.o. F) Primary Care Provider: Nyoka Gross Other Clinician: Referring Provider: Treating Provider/Extender: Allison Gross in Treatment: 2 Constitutional Patient is hypertensive.. Pulse regular and within target range for patient.Allison Gross  Respirations regular, non-labored and within target range.. Temperature is normal and within the target range for the patient.Allison Gross Appears in no distress. Notes Wound exam; left leg. She has good edema control. The wound is small but almost 100% covered in a difficult adherent slough. I debrided with this with a #3 curette minimal bleeding she does not tolerate this well. There is no evidence of surrounding infection Electronic Signature(s) Signed: 03/31/2022 5:47:10 PM By: Baltazar Najjar MD Entered By: Baltazar Najjar on 03/31/2022 09:09:57 -------------------------------------------------------------------------------- Physician Orders Details Patient Name: Date of Service: Allison Gross DINE 03/31/2022 8:30 A M Medical Record Number: [id number] Patient Account Number: [id number] Date of Birth/Sex: Treating RN: 28-Apr-1945 (76 y.o. Allison Gross Primary Care Provider: Nyoka Gross Other Clinician: Referring Provider: Treating Provider/Extender: Allison Gross in Treatment: 2 Verbal / Phone Orders: No Diagnosis Coding Follow-up Appointments ppointment in 1 week. - Dr. Lady Gary - Room 2 Return A Anesthetic Wound #1 Left,Lateral Lower Leg (In clinic) Topical Lidocaine 5% applied to wound bed Morine, Daelyn ([id number]) [phone].pdf Page 3 of 6 Bathing/ Shower/ Hygiene May shower with protection but do not get wound dressing(s) wet. - purchase cast protector from [facility], [facility] or [employer] Edema Control - Lymphedema / SCD / Other Left Lower Extremity Elevate legs to  the level of the heart or above for 30 minutes daily and/or when sitting, a frequency of: Avoid standing for long periods of time. Wound Treatment Wound #1 - Lower Leg Wound Laterality: Left, Lateral Cleanser: Soap and Water 1 x Per Week/30 Days Discharge Instructions: May shower and wash wound with dial antibacterial soap and water prior to dressing change. Peri-Wound Care: Sween Lotion (Moisturizing lotion) 1 x Per Week/30 Days Discharge Instructions: Apply moisturizing lotion as directed Prim Dressing: Hydrofera Blue Classic Foam, 2x2 in 1 x Per Week/30 Days ary Discharge Instructions: Moisten with saline prior to applying to wound bed Secondary Dressing: ABD Pad, 5x9 1 x Per Week/30 Days Discharge Instructions: Apply over primary dressing as directed. Secondary Dressing: Woven Gauze Sponge, Non-Sterile 4x4 in 1 x Per Week/30 Days Discharge Instructions: Apply over primary dressing as directed. Compression Wrap: ThreePress (3 layer compression wrap) 1 x Per Week/30 Days Discharge Instructions: Apply three layer compression as directed. Patient Medications llergies: Sulfa (Sulfonamide Antibiotics) A Notifications Medication Indication Start End 03/31/2022 lidocaine DOSE topical 5 % ointment - ointment topical Electronic Signature(s) Signed: 03/31/2022 4:22:59 PM By: Samuella Bruin Signed: 03/31/2022 5:47:10 PM By: Baltazar Najjar MD Entered By: Samuella Bruin on 03/31/2022 08:47:36 -------------------------------------------------------------------------------- Problem List Details Patient Name: Date of Service: Allison Gross DINE 03/31/2022 8:30 A M Medical Record Number: [id number] Patient Account Number: [id number] Date of Birth/Sex: Treating RN: Nov 09, 1945 (76 y.o. 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 F) Primary Care Provider: Nyoka Gross Other Clinician: Referring Provider: Treating Provider/Extender: Leanord Gross  Sandrea Hughs, Suella Broad in Treatment: 2 Subjective History of Present Illness (HPI) ADMISSION 03/16/2022 This is a 76 year old woman who struck her leg on a car door near the beginning of October. She was seen at an urgent care in [place], [place] and given a course of doxycycline. Her symptoms fail to improve and she saw a different urgent care provider who gave a 10-day course of Keflex without improvement. She presented to the emergency department on March 03, 2022 with concern for cellulitis and a wound infection. Due to swelling in the legs, a DVT scan was performed which was negative. She was given an injection of dalbavancin and referred to infectious disease. She saw ID on November 21. She was prescribed a 10-day course of cefadroxil and referred to the wound care center for further evaluation and management. ABI in clinic today was 1.2. On her left lower leg, there is a wound just lateral to the anterior tibial surface. There is some rubbery slough accumulation. She has 2+ pitting edema. No purulent drainage or  malodor. 03/24/2022: The wound is a bit smaller today and less painful. There is some slough on the surface. Edema control is improved. 12/13; the wound is 80% covered in adherent slough. This requires debridement. Using silver alginate and 3 layer. Her edema control is good Objective Constitutional Patient is hypertensive.. Pulse regular and within target range for patient.Allison Gross Respirations regular, non-labored and within target range.. Temperature is normal and within the target range for the patient.Allison Gross Appears in no distress. Vitals Time Taken: 8:32 AM, Height: 60 in, Weight: 154 lbs, BMI: 30.1, Temperature: 97.8 F, Pulse: 79 bpm, Respiratory Rate: 18 breaths/min, Blood Pressure: 159/87 mmHg. General Notes: Wound exam; left leg. She has good edema control. The wound is small but almost 100% covered in a difficult adherent slough. I debrided with this with a #3 curette minimal bleeding she does not tolerate this well. There is no evidence of surrounding infection Integumentary (Hair, Skin) Wound #1 status is Open. Original cause of wound was Trauma. The date acquired was: 01/23/2022. The wound has been in treatment 2 weeks. The wound is located on the Left,Lateral Lower Leg. The wound measures 2.7cm length x 1.7cm width x 0.2cm depth; 3.605cm^2 area and 0.721cm^3 volume. There is Fat Layer (Subcutaneous Tissue) exposed. There is no tunneling or undermining noted. There is a medium amount of serosanguineous drainage noted. The wound margin is distinct with the outline attached to the wound base. There is medium (34-66%) red granulation within the wound bed. There is a medium (34-66%) amount of necrotic tissue within the wound bed including Adherent Slough. The periwound skin appearance had no abnormalities noted for texture. The periwound skin appearance had no abnormalities noted for moisture. The periwound skin appearance exhibited: Erythema. The surrounding wound skin color is noted with erythema  which is circumferential. Periwound temperature was noted as No Abnormality. Assessment Active Problems DOMENICA, SIEMS ([id number]) [id number] Page 5 of 6 ICD-10 Non-pressure chronic ulcer of other part of left lower leg with fat layer exposed Essential (primary) hypertension Procedures Wound #1 Pre-procedure diagnosis of Wound #1 is a Trauma, Other located on the Left,Lateral Lower Leg . There was a Selective/Open Wound Non-Viable Tissue Debridement with a total area of 4.59 sq cm performed by Maxwell Caul., MD. With the following instrument(s): Curette to remove Non-Viable tissue/material. Material removed includes [facility] after achieving pain control using Lidocaine 5% topical ointment. No specimens were taken. A time out was conducted at 08:46, prior  to the start of the procedure. A Minimum amount of bleeding was controlled with Pressure. The procedure was tolerated well. Post Debridement Measurements: 2.7cm length x 1.7cm width x 0.2cm depth; 0.721cm^3 volume. Character of Wound/Ulcer Post Debridement is improved. Post procedure Diagnosis Wound #1: Same as Pre-Procedure General Notes: scribed for Dr. Leanord Gross by Samuella Bruin, RN. Pre-procedure diagnosis of Wound #1 is a Trauma, Other located on the Left,Lateral Lower Leg . There was a Three Layer Compression Therapy Procedure by Samuella Bruin, RN. Post procedure Diagnosis Wound #1: Same as Pre-Procedure Plan Follow-up Appointments: Return Appointment in 1 week. - Dr. Lady Gary - Room 2 Anesthetic: Wound #1 Left,Lateral Lower Leg: (In clinic) Topical Lidocaine 5% applied to wound bed Bathing/ Shower/ Hygiene: May shower with protection but do not get wound dressing(s) wet. - purchase cast protector from [facility], [facility] or [employer] Edema Control - Lymphedema / SCD / Other: Elevate legs to the level of the heart or above for 30 minutes daily and/or when sitting, a frequency of: Avoid standing for  long periods of time. The following medication(s) was prescribed: lidocaine topical 5 % ointment ointment topical was prescribed at facility WOUND #1: - Lower Leg Wound Laterality: Left, Lateral Cleanser: Soap and Water 1 x Per Week/30 Days Discharge Instructions: May shower and wash wound with dial antibacterial soap and water prior to dressing change. Peri-Wound Care: Sween Lotion (Moisturizing lotion) 1 x Per Week/30 Days Discharge Instructions: Apply moisturizing lotion as directed Prim Dressing: Hydrofera Blue Classic Foam, 2x2 in 1 x Per Week/30 Days ary Discharge Instructions: Moisten with saline prior to applying to wound bed Secondary Dressing: ABD Pad, 5x9 1 x Per Week/30 Days Discharge Instructions: Apply over primary dressing as directed. Secondary Dressing: Woven Gauze Sponge, Non-Sterile 4x4 in 1 x Per Week/30 Days Discharge Instructions: Apply over primary dressing as directed. Com pression Wrap: ThreePress (3 layer compression wrap) 1 x Per Week/30 Days Discharge Instructions: Apply three layer compression as directed. 1. I changed the dressing to Hydrofera Blue hopefully to help with the surface slough. 2. Otherwise the same compression Electronic Signature(s) Signed: 04/06/2022 12:01:55 PM By: Allison Guess MD FACS Signed: 06/07/2022 3:52:16 PM By: Baltazar Najjar MD Previous Signature: 03/31/2022 5:47:10 PM Version By: Baltazar Najjar MD Entered By: Allison Gross on 04/06/2022 12:01:55 -------------------------------------------------------------------------------- SuperBill Details Patient Name: Date of Service: HA Elita Quick DINE 03/31/2022 Medical Record Number: [id number] Patient Account Number: [id number] RANDALL, MEGA ([id number]) [phone].pdf Page 6 of 6 Date of Birth/Sex: Treating RN: March 13, 1946 (76 y.o. F) Primary Care Provider: Nyoka Gross Other Clinician: Referring Provider: Treating Provider/Extender: Allison Gross in Treatment: 2 Diagnosis Coding ICD-10 Codes Code Description [phone] Non-pressure chronic ulcer of other part of left lower leg with fat layer exposed I10 Essential (primary) hypertension Facility Procedures : CPT4 Code: [id number] Description: [phone] - DEBRIDE WOUND 1ST 20 SQ CM OR < ICD-10 Diagnosis Description L97.822 Non-pressure chronic ulcer of other part of left lower leg with fat layer expose Modifier: d Quantity: 1 Physician Procedures : CPT4 Code Description Modifier [id number] - WC PHYS DEBR WO ANESTH 20 SQ CM ICD-10 Diagnosis Description L97.822 Non-pressure chronic ulcer of other part of left lower leg with fat layer exposed Quantity: 1 Electronic Signature(s) Signed: 03/31/2022 5:47:10 PM By: Baltazar Najjar MD Entered By: Baltazar Najjar on 03/31/2022 09:12:21

## 2022-04-01 NOTE — Progress Notes (Signed)
Allison Gross, Allison Gross ([id number]) [id number] Page 1 of 8 Visit Report for 03/31/2022 Arrival Information Details Patient Name: Date of Service: Allison Gross 03/31/2022 8:30 A M Medical Record Number: [id number] Patient Account Number: [id number] Date of Birth/Sex: Treating RN: Feb 14, 1946 (76 y.o. Allison Gross Primary Care Michelene Keniston: Tanda Rockers Other Clinician: Referring Eulalio Reamy: Treating Dakota Vanwart/Extender: Erling Cruz in Treatment: 2 Visit Information History Since Last Visit Added or deleted any medications: No Patient Arrived: Ambulatory Any new allergies or adverse reactions: No Arrival Time: 08:30 Had a fall or experienced change in No Accompanied By: husband activities of daily living that may affect Transfer Assistance: None risk of falls: Patient Identification Verified: Yes Signs or symptoms of abuse/neglect since last visito No Secondary Verification Process Completed: Yes Hospitalized since last visit: No Patient Requires Transmission-Based Precautions: No Implantable device outside of the clinic excluding No Patient Has Alerts: No cellular tissue based products placed in the center since last visit: Has Dressing in Place as Prescribed: Yes Has Compression in Place as Prescribed: Yes Pain Present Now: No Electronic Signature(s) Signed: 03/31/2022 4:22:59 PM By: Adline Peals Entered By: Adline Peals on 03/31/2022 08:32:34 -------------------------------------------------------------------------------- Compression Therapy Details Patient Name: Date of Service: Allison Gross 03/31/2022 8:30 A M Medical Record Number: [id number] Patient Account Number: [id number] Date of Birth/Sex: Treating RN: [birth year] (76 y.o. Allison Gross Primary Care Yuriel Lopezmartinez: Tanda Rockers Other Clinician: Referring Venora Kautzman: Treating Shalise Rosado/Extender: Erling Cruz in  Treatment: 2 Compression Therapy Performed for Wound Assessment: Wound #1 Left,Lateral Lower Leg Performed By: Clinician Adline Peals, RN Compression Type: Three Layer Post Procedure Diagnosis Same as Pre-procedure Electronic Signature(s) Signed: 03/31/2022 4:22:59 PM By: Adline Peals Entered By: Adline Peals on 03/31/2022 08:48:12 Schurman, Allison Gross ([id number]) [id number] Page 2 of 8 -------------------------------------------------------------------------------- Encounter Discharge Information Details Patient Name: Date of Service: Allison Gross 03/31/2022 8:30 A M Medical Record Number: [id number] Patient Account Number: [id number] Date of Birth/Sex: Treating RN: 01-02-1946 (76 y.o. Allison Gross Primary Care Eastin Swing: Tanda Rockers Other Clinician: Referring Zareth Rippetoe: Treating Kyson Kupper/Extender: Erling Cruz in Treatment: 2 Encounter Discharge Information Items Post Procedure Vitals Discharge Condition: Stable Temperature (F): 97.8 Ambulatory Status: Ambulatory Pulse (bpm): 79 Discharge Destination: Home Respiratory Rate (breaths/min): 18 Transportation: Private Auto Blood Pressure (mmHg): 159/87 Accompanied By: husband Schedule Follow-up Appointment: Yes Clinical Summary of Care: Patient Declined Electronic Signature(s) Signed: 03/31/2022 4:22:59 PM By: Adline Peals Entered By: Adline Peals on 03/31/2022 08:48:52 -------------------------------------------------------------------------------- Lower Extremity Assessment Details Patient Name: Date of Service: Allison Gross 03/31/2022 8:30 A M Medical Record Number: [id number] Patient Account Number: [id number] Date of Birth/Sex: Treating RN: [birth year] (76 y.o. Allison Gross Primary Care Kieli Golladay: Tanda Rockers Other Clinician: Referring Hermie Reagor: Treating Corazon Nickolas/Extender: Erling Cruz  in Treatment: 2 Edema Assessment Assessed: Allison Gross: No] Allison Gross: No] Edema: [Left: Ye] [Right: s] Calf Left: Right: Point of Measurement: 30 cm From Medial Instep 34.9 cm Ankle Left: Right: Point of Measurement: 10 cm From Medial Instep 20 cm Vascular Assessment Pulses: Dorsalis Pedis Palpable: [Left:Yes] Electronic Signature(s) Signed: 03/31/2022 4:22:59 PM By: Adline Peals Entered By: Adline Peals on 03/31/2022 08:38:40 Multi Wound Chart Details -------------------------------------------------------------------------------- Allison Gross ([id number]) [id number] Page 3 of 8 Patient Name: Date of Service: Allison Gross 03/31/2022 8:30 A M Medical Record Number: [id number] Patient Account Number: [id number] Date of Birth/Sex: Treating RN: 12-19-1945 (76 y.o. F) Primary Care Wilman Tucker: Tanda Rockers  Other Clinician: Referring Ponce Skillman: Treating Jayna Mulnix/Extender: Erling Cruz in Treatment: 2 Vital Signs Height(in): 60 Pulse(bpm): 76 Weight(lbs): 154 Blood Pressure(mmHg): 159/87 Body Mass Index(BMI): 30.1 Temperature(F): 97.8 Respiratory Rate(breaths/min): 18 Wound Assessments Wound Number: 1 N/A N/A Photos: N/A N/A Left, Lateral Lower Leg N/A N/A Wound Location: Trauma N/A N/A Wounding Event: Trauma, Other N/A N/A Primary Etiology: Asthma, Hypertension, Seizure N/A N/A Comorbid History: Disorder 01/23/2022 N/A N/A Date Acquired: 2 N/A N/A Weeks of Treatment: Open N/A N/A Wound Status: No N/A N/A Wound Recurrence: 2.7x1.7x0.2 N/A N/A Measurements L x W x D (cm) 3.605 N/A N/A A (cm) : rea 0.721 N/A N/A Volume (cm) : 33.50% N/A N/A % Reduction in A rea: 55.70% N/A N/A % Reduction in Volume: Full Thickness Without Exposed N/A N/A Classification: Support Structures Medium N/A N/A Exudate A mount: Serosanguineous N/A N/A Exudate Type: red, brown N/A N/A Exudate Color: Distinct,  outline attached N/A N/A Wound Margin: Medium (34-66%) N/A N/A Granulation A mount: Red N/A N/A Granulation Quality: Medium (34-66%) N/A N/A Necrotic A mount: Fat Layer (Subcutaneous Tissue): Yes N/A N/A Exposed Structures: Fascia: No Tendon: No Muscle: No Joint: No Bone: No Small (1-33%) N/A N/A Epithelialization: Debridement - Selective/Open Wound N/A N/A Debridement: Pre-procedure Verification/Time Out 08:46 N/A N/A Taken: Lidocaine 5% topical ointment N/A N/A Pain Control: Slough N/A N/A Tissue Debrided: Non-Viable Tissue N/A N/A Level: 4.59 N/A N/A Debridement A (sq cm): rea Curette N/A N/A Instrument: Minimum N/A N/A Bleeding: Pressure N/A N/A Hemostasis A chieved: Procedure was tolerated well N/A N/A Debridement Treatment Response: 2.7x1.7x0.2 N/A N/A Post Debridement Measurements L x W x D (cm) 0.721 N/A N/A Post Debridement Volume: (cm) No Abnormalities Noted N/A N/A Periwound Skin Texture: No Abnormalities Noted N/A N/A Periwound Skin Moisture: Erythema: Yes N/A N/A Periwound Skin Color: Circumferential N/A N/A Erythema Location: No Abnormality N/A N/A Temperature: Compression Therapy N/A N/A Procedures Performed: Debridement Treatment Notes Wound #1 (Lower Leg) Wound Laterality: Left, Lateral Allison Gross, Allison Gross ([id number]) [id number] Page 4 of 8 Cleanser Soap and Water Discharge Instruction: May shower and wash wound with dial antibacterial soap and water prior to dressing change. Peri-Wound Care Sween Lotion (Moisturizing lotion) Discharge Instruction: Apply moisturizing lotion as directed Topical Primary Dressing Hydrofera Blue Classic Foam, 2x2 in Discharge Instruction: Moisten with saline prior to applying to wound bed Secondary Dressing ABD Pad, 5x9 Discharge Instruction: Apply over primary dressing as directed. Woven Gauze Sponge, Non-Sterile 4x4 in Discharge Instruction: Apply over primary dressing as  directed. Secured With Compression Wrap ThreePress (3 layer compression wrap) Discharge Instruction: Apply three layer compression as directed. Compression Stockings Add-Ons Electronic Signature(s) Signed: 03/31/2022 5:47:10 PM By: Linton Ham MD Entered By: Linton Ham on 03/31/2022 09:06:34 -------------------------------------------------------------------------------- Multi-Disciplinary Care Plan Details Patient Name: Date of Service: Allison Gross 03/31/2022 8:30 A M Medical Record Number: [id number] Patient Account Number: [id number] Date of Birth/Sex: Treating RN: August 02, 1945 (76 y.o. Allison Gross Primary Care Samin Milke: Tanda Rockers Other Clinician: Referring Adolphe Fortunato: Treating Nour Scalise/Extender: Erling Cruz in Treatment: 2 Active Inactive Orientation to the Wound Care Program Nursing Diagnoses: Knowledge deficit related to the wound healing center program Goals: Patient/caregiver will verbalize understanding of the [place] Date Initiated: 03/16/2022 Target Resolution Date: 06/17/2022 Goal Status: Active Interventions: Provide education on orientation to the wound center Notes: Wound/Skin Impairment Nursing Diagnoses: Impaired tissue integrity Allison Gross, Allison Gross ([id number]) [id number] Page 5 of 8 Knowledge deficit related to ulceration/compromised skin integrity Goals: Patient/caregiver will  verbalize understanding of skin care regimen Date Initiated: 03/16/2022 Target Resolution Date: 06/17/2022 Goal Status: Active Ulcer/skin breakdown will have a volume reduction of 30% by week 4 Date Initiated: 03/16/2022 Target Resolution Date: 06/17/2022 Goal Status: Active Interventions: Assess patient/caregiver ability to obtain necessary supplies Assess patient/caregiver ability to perform ulcer/skin care regimen upon admission and as needed Assess ulceration(s) every  visit Provide education on ulcer and skin care Notes: Electronic Signature(s) Signed: 03/31/2022 4:22:59 PM By: Adline Peals Entered By: Adline Peals on 03/31/2022 08:40:25 -------------------------------------------------------------------------------- Pain Assessment Details Patient Name: Date of Service: Allison Gross 03/31/2022 8:30 A M Medical Record Number: [id number] Patient Account Number: [id number] Date of Birth/Sex: Treating RN: 1945-05-17 (76 y.o. Allison Gross Primary Care Mykai Wendorf: Tanda Rockers Other Clinician: Referring Leva Baine: Treating Dayan Kreis/Extender: Erling Cruz in Treatment: 2 Active Problems Location of Pain Severity and Description of Pain Patient Has Paino No Site Locations Rate the pain. Current Pain Level: 0 Pain Management and Medication Current Pain Management: Electronic Signature(s) Signed: 03/31/2022 4:22:59 PM By: Adline Peals Entered By: Adline Peals on 03/31/2022 08:33:05 Allison Gross, Allison Gross ([id number]) [id number] Page 6 of 8 -------------------------------------------------------------------------------- Patient/Caregiver Education Details Patient Name: Date of Service: Allison Gross 12/13/2023andnbsp8:30 A M Medical Record Number: [id number] Patient Account Number: [id number] Date of Birth/Gender: Treating RN: 1945-05-11 (76 y.o. Allison Gross Primary Care Physician: Tanda Rockers Other Clinician: Referring Physician: Treating Physician/Extender: Erling Cruz in Treatment: 2 Education Assessment Education Provided To: Patient Education Topics Provided Wound/Skin Impairment: Methods: Explain/Verbal Responses: Reinforcements needed, State content correctly Electronic Signature(s) Signed: 03/31/2022 4:22:59 PM By: Adline Peals Entered By: Adline Peals on 03/31/2022  08:40:38 -------------------------------------------------------------------------------- Wound Assessment Details Patient Name: Date of Service: Allison Gross 03/31/2022 8:30 A M Medical Record Number: [id number] Patient Account Number: [id number] Date of Birth/Sex: Treating RN: January 07, 1946 (76 y.o. Allison Gross Primary Care Brinklee Cisse: Tanda Rockers Other Clinician: Referring Nicholaus Steinke: Treating Allison Gross/Extender: Erling Cruz in Treatment: 2 Wound Status Wound Number: 1 Primary Etiology: Trauma, Other Wound Location: Left, Lateral Lower Leg Wound Status: Open Wounding Event: Trauma Comorbid History: Asthma, Hypertension, Seizure Disorder Date Acquired: 01/23/2022 Weeks Of Treatment: 2 Clustered Wound: No Photos Wound Measurements Length: (cm) 2 Width: (cm) 1 Depth: (cm) 0 Area: (cm) Volume: (cm) Allison Gross, Allison Gross ([id number]) .7 % Reduction in Area: 33.5% .7 % Reduction in Volume: 55.7% .2 Epithelialization: Small (1-33%) 3.605 Tunneling: No 0.721 Undermining: No [id number] Page 7 of 8 Wound Description Classification: Full Thickness Without Exposed Support Structures Wound Margin: Distinct, outline attached Exudate Amount: Medium Exudate Type: Serosanguineous Exudate Color: red, brown Foul Odor After Cleansing: No Slough/Fibrino Yes Wound Bed Granulation Amount: Medium (34-66%) Exposed Structure Granulation Quality: Red Fascia Exposed: No Necrotic Amount: Medium (34-66%) Fat Layer (Subcutaneous Tissue) Exposed: Yes Necrotic Quality: Adherent Slough Tendon Exposed: No Muscle Exposed: No Joint Exposed: No Bone Exposed: No Periwound Skin Texture Texture Color No Abnormalities Noted: Yes No Abnormalities Noted: No Erythema: Yes Moisture Erythema Location: Circumferential No Abnormalities Noted: Yes Temperature / Pain Temperature: No Abnormality Treatment Notes Wound #1 (Lower Leg) Wound  Laterality: Left, Lateral Cleanser Soap and Water Discharge Instruction: May shower and wash wound with dial antibacterial soap and water prior to dressing change. Peri-Wound Care Sween Lotion (Moisturizing lotion) Discharge Instruction: Apply moisturizing lotion as directed Topical Primary Dressing Hydrofera Blue Classic Foam, 2x2 in Discharge Instruction: Moisten with saline prior to applying to wound bed Secondary Dressing ABD Pad,  verbalize understanding of skin care regimen Date Initiated: 03/16/2022 Target Resolution Date: 06/17/2022 Goal Status: Active Ulcer/skin breakdown will have a volume reduction of 30% by week 4 Date Initiated: 03/16/2022 Target Resolution Date: 06/17/2022 Goal Status: Active Interventions: Assess patient/caregiver ability to obtain necessary supplies Assess patient/caregiver ability to perform ulcer/skin care regimen upon admission and as needed Assess ulceration(s) every  visit Provide education on ulcer and skin care Notes: Electronic Signature(s) Signed: 03/31/2022 4:22:59 PM By: Adline Peals Entered By: Adline Peals on 03/31/2022 08:40:25 -------------------------------------------------------------------------------- Pain Assessment Details Patient Name: Date of Service: Allison Gross 03/31/2022 8:30 A M Medical Record Number: [id number] Patient Account Number: [id number] Date of Birth/Sex: Treating RN: 1945-05-17 (76 y.o. Allison Gross Primary Care Mykai Wendorf: Tanda Rockers Other Clinician: Referring Leva Baine: Treating Dayan Kreis/Extender: Erling Cruz in Treatment: 2 Active Problems Location of Pain Severity and Description of Pain Patient Has Paino No Site Locations Rate the pain. Current Pain Level: 0 Pain Management and Medication Current Pain Management: Electronic Signature(s) Signed: 03/31/2022 4:22:59 PM By: Adline Peals Entered By: Adline Peals on 03/31/2022 08:33:05 Allison Gross, Allison Gross ([id number]) [id number] Page 6 of 8 -------------------------------------------------------------------------------- Patient/Caregiver Education Details Patient Name: Date of Service: Allison Gross 12/13/2023andnbsp8:30 A M Medical Record Number: [id number] Patient Account Number: [id number] Date of Birth/Gender: Treating RN: 1945-05-11 (76 y.o. Allison Gross Primary Care Physician: Tanda Rockers Other Clinician: Referring Physician: Treating Physician/Extender: Erling Cruz in Treatment: 2 Education Assessment Education Provided To: Patient Education Topics Provided Wound/Skin Impairment: Methods: Explain/Verbal Responses: Reinforcements needed, State content correctly Electronic Signature(s) Signed: 03/31/2022 4:22:59 PM By: Adline Peals Entered By: Adline Peals on 03/31/2022  08:40:38 -------------------------------------------------------------------------------- Wound Assessment Details Patient Name: Date of Service: Allison Gross 03/31/2022 8:30 A M Medical Record Number: [id number] Patient Account Number: [id number] Date of Birth/Sex: Treating RN: January 07, 1946 (76 y.o. Allison Gross Primary Care Brinklee Cisse: Tanda Rockers Other Clinician: Referring Nicholaus Steinke: Treating Allison Gross/Extender: Erling Cruz in Treatment: 2 Wound Status Wound Number: 1 Primary Etiology: Trauma, Other Wound Location: Left, Lateral Lower Leg Wound Status: Open Wounding Event: Trauma Comorbid History: Asthma, Hypertension, Seizure Disorder Date Acquired: 01/23/2022 Weeks Of Treatment: 2 Clustered Wound: No Photos Wound Measurements Length: (cm) 2 Width: (cm) 1 Depth: (cm) 0 Area: (cm) Volume: (cm) Allison Gross, Allison Gross ([id number]) .7 % Reduction in Area: 33.5% .7 % Reduction in Volume: 55.7% .2 Epithelialization: Small (1-33%) 3.605 Tunneling: No 0.721 Undermining: No [id number] Page 7 of 8 Wound Description Classification: Full Thickness Without Exposed Support Structures Wound Margin: Distinct, outline attached Exudate Amount: Medium Exudate Type: Serosanguineous Exudate Color: red, brown Foul Odor After Cleansing: No Slough/Fibrino Yes Wound Bed Granulation Amount: Medium (34-66%) Exposed Structure Granulation Quality: Red Fascia Exposed: No Necrotic Amount: Medium (34-66%) Fat Layer (Subcutaneous Tissue) Exposed: Yes Necrotic Quality: Adherent Slough Tendon Exposed: No Muscle Exposed: No Joint Exposed: No Bone Exposed: No Periwound Skin Texture Texture Color No Abnormalities Noted: Yes No Abnormalities Noted: No Erythema: Yes Moisture Erythema Location: Circumferential No Abnormalities Noted: Yes Temperature / Pain Temperature: No Abnormality Treatment Notes Wound #1 (Lower Leg) Wound  Laterality: Left, Lateral Cleanser Soap and Water Discharge Instruction: May shower and wash wound with dial antibacterial soap and water prior to dressing change. Peri-Wound Care Sween Lotion (Moisturizing lotion) Discharge Instruction: Apply moisturizing lotion as directed Topical Primary Dressing Hydrofera Blue Classic Foam, 2x2 in Discharge Instruction: Moisten with saline prior to applying to wound bed Secondary Dressing ABD Pad,

## 2022-04-07 ENCOUNTER — Encounter (HOSPITAL_BASED_OUTPATIENT_CLINIC_OR_DEPARTMENT_OTHER): Payer: Medicare Other | Admitting: General Surgery

## 2022-04-07 DIAGNOSIS — L97822 Non-pressure chronic ulcer of other part of left lower leg with fat layer exposed: Secondary | ICD-10-CM | POA: Diagnosis not present

## 2022-04-07 NOTE — Progress Notes (Signed)
Allison, Gross ([id number]) [id number] Page 1 of 8 Visit Report for 04/07/2022 Arrival Information Details Patient Name: Date of Service: Allison Gross DINE 04/07/2022 10:00 A M Medical Record Number: [id number] Patient Account Number: [id number] Date of Birth/Sex: Treating RN: 15-Oct-1945 (76 y.o. [name] Primary Care Vern Prestia: Tanda Rockers Other Clinician: Referring [name] Diamond: Treating Malyk Girouard/Extender: Berniece Salines in Treatment: 3 Visit Information History Since Last Visit Added or deleted any medications: No Patient Arrived: Ambulatory Any new allergies or adverse reactions: No Arrival Time: 09:55 Had a fall or experienced change in No Accompanied By: husband activities of daily living that may affect Transfer Assistance: None risk of falls: Patient Identification Verified: Yes Signs or symptoms of abuse/neglect since last visito No Secondary Verification Process Completed: Yes Hospitalized since last visit: No Patient Requires Transmission-Based Precautions: No Implantable device outside of the clinic excluding No Patient Has Alerts: No cellular tissue based products placed in the center since last visit: Has Dressing in Place as Prescribed: Yes Has Compression in Place as Prescribed: Yes Pain Present Now: No Electronic Signature(s) Signed: 04/07/2022 4:38:38 PM By: Adline Peals Entered By: Adline Peals on 04/07/2022 09:56:13 -------------------------------------------------------------------------------- Compression Therapy Details Patient Name: Date of Service: Allison Gross DINE 04/07/2022 10:00 A M Medical Record Number: [id number] Patient Account Number: [id number] Date of Birth/Sex: Treating RN: 07-17-1945 (76 y.o. [name] Primary Care Alycia Cooperwood: Tanda Rockers Other Clinician: Referring Leela Vanbrocklin: Treating Oksana Deberry/Extender: Berniece Salines in  Treatment: 3 Compression Therapy Performed for Wound Assessment: Wound #1 Left,Lateral Lower Leg Performed By: Clinician Adline Peals, RN Compression Type: Three Layer Post Procedure Diagnosis Same as Pre-procedure Electronic Signature(s) Signed: 04/07/2022 4:38:38 PM By: Adline Peals Entered By: Adline Peals on 04/07/2022 10:27:52 Allison, Alexander Gross ([id number]) [id number] Page 2 of 8 -------------------------------------------------------------------------------- Encounter Discharge Information Details Patient Name: Date of Service: Allison Gross DINE 04/07/2022 10:00 A M Medical Record Number: [id number] Patient Account Number: [id number] Date of Birth/Sex: Treating RN: [birth year] (76 y.o. [name] Primary Care Kden Wagster: Tanda Rockers Other Clinician: Referring Kaylanie Capili: Treating Daviel Allegretto/Extender: Berniece Salines in Treatment: 3 Encounter Discharge Information Items Post Procedure Vitals Discharge Condition: Stable Temperature (F): 98 Ambulatory Status: Ambulatory Pulse (bpm): 87 Discharge Destination: Home Respiratory Rate (breaths/min): 16 Transportation: Private Auto Blood Pressure (mmHg): 150/79 Accompanied By: husband Schedule Follow-up Appointment: Yes Clinical Summary of Care: Patient Declined Electronic Signature(s) Signed: 04/07/2022 4:38:38 PM By: Adline Peals Entered By: Adline Peals on 04/07/2022 10:16:45 -------------------------------------------------------------------------------- Lower Extremity Assessment Details Patient Name: Date of Service: Allison Gross DINE 04/07/2022 10:00 A M Medical Record Number: [id number] Patient Account Number: [id number] Date of Birth/Sex: Treating RN: 1945-07-13 (76 y.o. [name] Primary Care Zelia Yzaguirre: Tanda Rockers Other Clinician: Referring Niamh Rada: Treating Kewanna Kasprzak/Extender: Berniece Salines in Treatment: 3 Edema Assessment Assessed: Shirlyn Goltz: No] [Right: No] Edema: [Left: Ye] [Right: s] Calf Left: Right: Point of Measurement: 30 cm From Medial Instep 33.1 cm Ankle Left: Right: Point of Measurement: 10 cm From Medial Instep 19.4 cm Vascular Assessment Pulses: Dorsalis Pedis Palpable: [Left:Yes] Electronic Signature(s) Signed: 04/07/2022 4:38:38 PM By: Adline Peals Entered By: Adline Peals on 04/07/2022 10:00:35 Multi Wound Chart Details -------------------------------------------------------------------------------- Allison Gross ([id number]) [id number] Page 3 of 8 Patient Name: Date of Service: Allison Gross DINE 04/07/2022 10:00 A M Medical Record Number: [id number] Patient Account Number: [id number] Date of Birth/Sex: Treating RN: 06-Nov-1945 (76 y.o. F) Primary Care Solei Wubben: Tanda Rockers  Allison, Gross ([id number]) [id number] Page 1 of 8 Visit Report for 04/07/2022 Arrival Information Details Patient Name: Date of Service: Allison Gross DINE 04/07/2022 10:00 A M Medical Record Number: [id number] Patient Account Number: [id number] Date of Birth/Sex: Treating RN: 15-Oct-1945 (76 y.o. [name] Primary Care Vern Prestia: Tanda Rockers Other Clinician: Referring [name] Diamond: Treating Malyk Girouard/Extender: Berniece Salines in Treatment: 3 Visit Information History Since Last Visit Added or deleted any medications: No Patient Arrived: Ambulatory Any new allergies or adverse reactions: No Arrival Time: 09:55 Had a fall or experienced change in No Accompanied By: husband activities of daily living that may affect Transfer Assistance: None risk of falls: Patient Identification Verified: Yes Signs or symptoms of abuse/neglect since last visito No Secondary Verification Process Completed: Yes Hospitalized since last visit: No Patient Requires Transmission-Based Precautions: No Implantable device outside of the clinic excluding No Patient Has Alerts: No cellular tissue based products placed in the center since last visit: Has Dressing in Place as Prescribed: Yes Has Compression in Place as Prescribed: Yes Pain Present Now: No Electronic Signature(s) Signed: 04/07/2022 4:38:38 PM By: Adline Peals Entered By: Adline Peals on 04/07/2022 09:56:13 -------------------------------------------------------------------------------- Compression Therapy Details Patient Name: Date of Service: Allison Gross DINE 04/07/2022 10:00 A M Medical Record Number: [id number] Patient Account Number: [id number] Date of Birth/Sex: Treating RN: 07-17-1945 (76 y.o. [name] Primary Care Alycia Cooperwood: Tanda Rockers Other Clinician: Referring Leela Vanbrocklin: Treating Oksana Deberry/Extender: Berniece Salines in  Treatment: 3 Compression Therapy Performed for Wound Assessment: Wound #1 Left,Lateral Lower Leg Performed By: Clinician Adline Peals, RN Compression Type: Three Layer Post Procedure Diagnosis Same as Pre-procedure Electronic Signature(s) Signed: 04/07/2022 4:38:38 PM By: Adline Peals Entered By: Adline Peals on 04/07/2022 10:27:52 Allison, Alexander Gross ([id number]) [id number] Page 2 of 8 -------------------------------------------------------------------------------- Encounter Discharge Information Details Patient Name: Date of Service: Allison Gross DINE 04/07/2022 10:00 A M Medical Record Number: [id number] Patient Account Number: [id number] Date of Birth/Sex: Treating RN: [birth year] (76 y.o. [name] Primary Care Kden Wagster: Tanda Rockers Other Clinician: Referring Kaylanie Capili: Treating Daviel Allegretto/Extender: Berniece Salines in Treatment: 3 Encounter Discharge Information Items Post Procedure Vitals Discharge Condition: Stable Temperature (F): 98 Ambulatory Status: Ambulatory Pulse (bpm): 87 Discharge Destination: Home Respiratory Rate (breaths/min): 16 Transportation: Private Auto Blood Pressure (mmHg): 150/79 Accompanied By: husband Schedule Follow-up Appointment: Yes Clinical Summary of Care: Patient Declined Electronic Signature(s) Signed: 04/07/2022 4:38:38 PM By: Adline Peals Entered By: Adline Peals on 04/07/2022 10:16:45 -------------------------------------------------------------------------------- Lower Extremity Assessment Details Patient Name: Date of Service: Allison Gross DINE 04/07/2022 10:00 A M Medical Record Number: [id number] Patient Account Number: [id number] Date of Birth/Sex: Treating RN: 1945-07-13 (76 y.o. [name] Primary Care Zelia Yzaguirre: Tanda Rockers Other Clinician: Referring Niamh Rada: Treating Kewanna Kasprzak/Extender: Berniece Salines in Treatment: 3 Edema Assessment Assessed: Shirlyn Goltz: No] [Right: No] Edema: [Left: Ye] [Right: s] Calf Left: Right: Point of Measurement: 30 cm From Medial Instep 33.1 cm Ankle Left: Right: Point of Measurement: 10 cm From Medial Instep 19.4 cm Vascular Assessment Pulses: Dorsalis Pedis Palpable: [Left:Yes] Electronic Signature(s) Signed: 04/07/2022 4:38:38 PM By: Adline Peals Entered By: Adline Peals on 04/07/2022 10:00:35 Multi Wound Chart Details -------------------------------------------------------------------------------- Allison Gross ([id number]) [id number] Page 3 of 8 Patient Name: Date of Service: Allison Gross DINE 04/07/2022 10:00 A M Medical Record Number: [id number] Patient Account Number: [id number] Date of Birth/Sex: Treating RN: 06-Nov-1945 (76 y.o. F) Primary Care Solei Wubben: Tanda Rockers  Initiated: 03/16/2022 Target Resolution Date: 06/17/2022 Goal Status: Active Ulcer/skin breakdown will have a volume reduction of 30% by week 4 Date Initiated: 03/16/2022 Target Resolution Date: 06/17/2022 Goal Status: Active Interventions: Assess patient/caregiver ability to obtain necessary supplies Assess patient/caregiver ability to perform ulcer/skin care regimen upon admission and as needed Assess ulceration(s) every visit Provide  education on ulcer and skin care Notes: Electronic Signature(s) Signed: 04/07/2022 4:38:38 PM By: Adline Peals Entered By: Adline Peals on 04/07/2022 10:08:05 -------------------------------------------------------------------------------- Pain Assessment Details Patient Name: Date of Service: Allison Gross DINE 04/07/2022 10:00 [place] Record Number: [id number] Patient Account Number: [id number] Date of Birth/Sex: Treating RN: [birth year] (76 y.o. [name] Primary Care Bertina Guthridge: Tanda Rockers Other Clinician: Referring Chyrl Elwell: Treating Rizwan Kuyper/Extender: Berniece Salines in Treatment: 3 Active Problems Location of Pain Severity and Description of Pain Patient Has Paino No Site Locations Rate the pain. Current Pain Level: 0 Pain Management and Medication Current Pain Management: Electronic Signature(s) Signed: 04/07/2022 4:38:38 PM By: Adline Peals Entered By: Adline Peals on 04/07/2022 09:56:19 Depace, Fleda ([id number]) [id number] Page 6 of 8 -------------------------------------------------------------------------------- Patient/Caregiver Education Details Patient Name: Date of Service: Allison Gross DINE 12/20/2023andnbsp10:00 A M Medical Record Number: [id number] Patient Account Number: [id number] Date of Birth/Gender: Treating RN: 1946-04-02 (76 y.o. [name] Primary Care Physician: Tanda Rockers Other Clinician: Referring Physician: Treating Physician/Extender: Berniece Salines in Treatment: 3 Education Assessment Education Provided To: Patient Education Topics Provided Wound/Skin Impairment: Methods: Explain/Verbal Responses: Reinforcements needed, State content correctly Electronic Signature(s) Signed: 04/07/2022 4:38:38 PM By: Adline Peals Entered By: Adline Peals on 04/07/2022  10:08:22 -------------------------------------------------------------------------------- Wound Assessment Details Patient Name: Date of Service: Allison Gross DINE 04/07/2022 10:00 A M Medical Record Number: [id number] Patient Account Number: [id number] Date of Birth/Sex: Treating RN: 1945/07/16 (76 y.o. [name] Primary Care Randilyn Foisy: Tanda Rockers Other Clinician: Referring Charnell Peplinski: Treating Holliday Sheaffer/Extender: Berniece Salines in Treatment: 3 Wound Status Wound Number: 1 Primary Etiology: Trauma, Other Wound Location: Left, Lateral Lower Leg Wound Status: Open Wounding Event: Trauma Comorbid History: Asthma, Hypertension, Seizure Disorder Date Acquired: 01/23/2022 Weeks Of Treatment: 3 Clustered Wound: No Photos Wound Measurements Length: (cm) 2.1 Width: (cm) 1.4 Parkison, Uldine ([id number]) Depth: (cm) 0.1 Area: (cm) 2.309 Volume: (cm) 0.231 % Reduction in Area: 57.4% % Reduction in Volume: 85.8% [id number] Page 7 of 8 Epithelialization: Small (1-33%) Tunneling: No Undermining: No Wound Description Classification: Full Thickness Without Exposed Support Structures Wound Margin: Distinct, outline attached Exudate Amount: Medium Exudate Type: Serosanguineous Exudate Color: red, brown Foul Odor After Cleansing: No Slough/Fibrino Yes Wound Bed Granulation Amount: Medium (34-66%) Exposed Structure Granulation Quality: Red Fascia Exposed: No Necrotic Amount: Medium (34-66%) Fat Layer (Subcutaneous Tissue) Exposed: Yes Necrotic Quality: Adherent Slough Tendon Exposed: No Muscle Exposed: No Joint Exposed: No Bone Exposed: No Periwound Skin Texture Texture Color No Abnormalities Noted: Yes No Abnormalities Noted: Yes Moisture Temperature / Pain No Abnormalities Noted: Yes Temperature: No Abnormality Treatment Notes Wound #1 (Lower Leg) Wound Laterality: Left, Lateral Cleanser Soap and  Water Discharge Instruction: May shower and wash wound with dial antibacterial soap and water prior to dressing change. Peri-Wound Care Sween Lotion (Moisturizing lotion) Discharge Instruction: Apply moisturizing lotion as directed Topical Primary Dressing Hydrofera Blue Ready Transfer Foam, 2.5x2.5 (in/in) Discharge Instruction: Apply directly to wound bed as directed Secondary Dressing ABD Pad, 5x9 Discharge Instruction: Apply over primary dressing as directed. Woven Gauze Sponge, Non-Sterile 4x4 in Discharge  Instruction: Apply over primary dressing as directed. Secured With Compression Wrap ThreePress (3 layer compression wrap) Discharge Instruction: Apply three layer compression as directed. Compression Stockings Add-Ons Electronic Signature(s) Signed: 04/07/2022 4:38:38 PM By: Adline Peals Entered By: Adline Peals on 04/07/2022 10:03:57 Vitals Details -------------------------------------------------------------------------------- Allison Gross ([id number]) [id number] Page 8 of 8 Patient Name: Date of Service: Allison Gross DINE 04/07/2022 10:00 A M Medical Record Number: [id number] Patient Account Number: [id number] Date of Birth/Sex: Treating RN: 1945-07-27 (76 y.o. [name] Primary Care Halei Hanover: Tanda Rockers Other Clinician: Referring Willamae Demby: Treating Kyrillos Adams/Extender: Berniece Salines in Treatment: 3 Vital Signs Time Taken: 09:56 Temperature (F): 98.0 Height (in): 60 Pulse (bpm): 87 Weight (lbs): 154 Respiratory Rate (breaths/min): 20 Body Mass Index (BMI): 30.1 Blood Pressure (mmHg): 150/79 Reference Range: 80 - 120 mg / dl Electronic Signature(s) Signed: 04/07/2022 4:38:38 PM By: Adline Peals Entered By: Adline Peals on 04/07/2022 09:58:43

## 2022-04-07 NOTE — Progress Notes (Signed)
this week. Edema control is good. There is a bit of slough on the wound surface, underneath which there is good granulation tissue. Electronic Signature(s) Signed: 04/07/2022 10:41:26 AM By: Fredirick Maudlin MD FACS Entered By: Fredirick Maudlin on 04/07/2022 10:41:25 -------------------------------------------------------------------------------- Physical Exam Details Patient Name: Date of Service: Allison Gross DINE 04/07/2022 10:00 A M Medical Record Number: [id number] Patient Account Number: [id number] Date of Birth/Sex: Treating RN: 09-04-1945 (76 y.o. F) Primary Care Provider: Tanda Rockers Other Clinician: Referring Provider: Treating Provider/Extender: Berniece Salines in Treatment: 3 Constitutional She is hypertensive, but asymptomatic.. . . . No acute distress. Respiratory Normal work of breathing on room air. Notes 04/07/2022: The wound is smaller again this week. Edema control is good. There is a bit of slough on the wound surface, underneath which there is good granulation tissue. IRENA, GAYDOS ([id number]) [id number] Page 3 of 8 Electronic Signature(s) Signed: 04/07/2022 10:42:17 AM By: Fredirick Maudlin MD FACS Entered By: Fredirick Maudlin on 04/07/2022 10:42:16 -------------------------------------------------------------------------------- Physician Orders Details Patient Name: Date of Service: Allison Gross DINE 04/07/2022 10:00 A M Medical Record Number: [id number] Patient Account Number: [id number] Date of  Birth/Sex: Treating RN: 08-May-1945 (76 y.o. [name] Primary Care Provider: Tanda Rockers Other Clinician: Referring Provider: Treating Provider/Extender: Berniece Salines in Treatment: 3 Verbal / Phone Orders: No Diagnosis Coding ICD-10 Coding Code Description [phone] Non-pressure chronic ulcer of other part of left lower leg with fat layer exposed I10 Essential (primary) hypertension Follow-up Appointments ppointment in 1 week. - Dr. Celine Ahr - Room 2 Return A Anesthetic Wound #1 Left,Lateral Lower Leg (In clinic) Topical Lidocaine 4% applied to wound bed Bathing/ Shower/ Hygiene May shower with protection but do not get wound dressing(s) wet. - purchase cast protector from [facility] or [employer] Edema Control - Lymphedema / SCD / Other Left Lower Extremity Elevate legs to the level of the heart or above for 30 minutes daily and/or when sitting, a frequency of: Avoid standing for long periods of time. Wound Treatment Wound #1 - Lower Leg Wound Laterality: Left, Lateral Cleanser: Soap and Water 1 x Per Week/30 Days Discharge Instructions: May shower and wash wound with dial antibacterial soap and water prior to dressing change. Peri-Wound Care: Sween Lotion (Moisturizing lotion) 1 x Per Week/30 Days Discharge Instructions: Apply moisturizing lotion as directed Prim Dressing: Hydrofera Blue Ready Transfer Foam, 2.5x2.5 (in/in) 1 x Per Week/30 Days ary Discharge Instructions: Apply directly to wound bed as directed Secondary Dressing: ABD Pad, 5x9 1 x Per Week/30 Days Discharge Instructions: Apply over primary dressing as directed. Secondary Dressing: Woven Gauze Sponge, Non-Sterile 4x4 in 1 x Per Week/30 Days Discharge Instructions: Apply over primary dressing as directed. Compression Wrap: ThreePress (3 layer compression wrap) 1 x Per Week/30 Days Discharge Instructions: Apply three layer compression as directed. Patient  Medications llergies: Sulfa (Sulfonamide Antibiotics) A Notifications Medication Indication Start End 04/07/2022 lidocaine DOSE topical 4 % cream - cream topical Moor, Gaylene ([id number]) [phone].pdf Page 4 of 8 Electronic Signature(s) Signed: 04/07/2022 11:36:52 AM By: Fredirick Maudlin MD FACS Entered By: Fredirick Maudlin on 04/07/2022 10:42:31 -------------------------------------------------------------------------------- Problem List Details Patient Name: Date of Service: HA [name] DINE 04/07/2022 10:00 A M Medical Record Number: [id number] Patient Account Number: [id number] Date of Birth/Sex: Treating RN: 1945-08-16 (76 y.o. F) Primary Care Provider: Tanda Rockers Other Clinician: Referring Provider: Treating Provider/Extender: Berniece Salines in Treatment: 3 Active Problems ICD-10 Encounter Code Description Active Date  MDM Diagnosis L97.822 Non-pressure chronic ulcer of other part of left lower leg with fat layer 03/16/2022 No Yes exposed [place] (primary) hypertension 03/16/2022 No Yes Inactive Problems Resolved Problems Electronic Signature(s) Signed: 04/07/2022 10:40:56 AM By: Fredirick Maudlin MD FACS Previous Signature: 04/07/2022 10:23:22 AM Version By: Fredirick Maudlin MD FACS Entered By: Fredirick Maudlin on 04/07/2022 10:40:55 -------------------------------------------------------------------------------- Progress Note Details Patient Name: Date of Service: HA [name] DINE 04/07/2022 10:00 A M Medical Record Number: [id number] Patient Account Number: [id number] Date of Birth/Sex: Treating RN: December 09, 1945 (76 y.o. F) Primary Care Provider: Tanda Rockers Other Clinician: Referring Provider: Treating Provider/Extender: Berniece Salines in Treatment: 3 Subjective Chief Complaint Information obtained from Patient Patient seen for complaints of Non-Healing  Wound. History of Present Illness (HPI) ADMISSION 03/16/2022 This is a 76 year old woman who struck her leg on a car door near the beginning of October. She was seen at an urgent care in [place], [place] and given a course of doxycycline. Her symptoms fail to improve and she saw a different urgent care provider who gave a 10-day course of Keflex without improvement. She presented to the emergency department on March 03, 2022 with concern for cellulitis and a wound infection. Due to swelling in the legs, a DVT scan was performed which was negative. She was given an injection of dalbavancin and referred to infectious disease. She saw ID on November 21. ERI, MCEVERS ([id number]) [id number] Page 5 of 8 She was prescribed a 10-day course of cefadroxil and referred to the wound care center for further evaluation and management. ABI in clinic today was 1.2. On her left lower leg, there is a wound just lateral to the anterior tibial surface. There is some rubbery slough accumulation. She has 2+ pitting edema. No purulent drainage or malodor. 03/24/2022: The wound is a bit smaller today and less painful. There is some slough on the surface. Edema control is improved. 12/13; the wound is 80% covered in adherent slough. This requires debridement. Using silver alginate and 3 layer. Her edema control is good 04/07/2022: The wound is smaller again this week. Edema control is good. There is a bit of slough on the wound surface, underneath which there is good granulation tissue. Patient History Information obtained from Patient. Family History Cancer - Maternal Grandparents,Paternal Grandparents, Diabetes, Heart Disease - Maternal Grandparents, Hypertension, No family history of Seizures, Stroke, Thyroid Problems, Tuberculosis. Social History Never smoker, Marital Status - Married, Alcohol Use - Rarely, Drug Use - No History, Caffeine Use - Daily. Medical  History Respiratory Patient has history of Asthma Cardiovascular Patient has history of Hypertension Neurologic Patient has history of Seizure Disorder - petit mal seizures Hospitalization/Surgery History - cataract extraction 2022. - oophorectomy 2005. - cholecystectomy. - hernia repair. - mohs surgery. - hysterectomy. Medical A Surgical History Notes nd Gastrointestinal reflux Objective Constitutional She is hypertensive, but asymptomatic.Marland Kitchen No acute distress. Vitals Time Taken: 9:56 AM, Height: 60 in, Weight: 154 lbs, BMI: 30.1, Temperature: 98.0 F, Pulse: 87 bpm, Respiratory Rate: 20 breaths/min, Blood Pressure: 150/79 mmHg. Respiratory Normal work of breathing on room air. General Notes: 04/07/2022: The wound is smaller again this week. Edema control is good. There is a bit of slough on the wound surface, underneath which there is good granulation tissue. Integumentary (Hair, Skin) Wound #1 status is Open. Original cause of wound was Trauma. The date acquired was: 01/23/2022. The wound has been in treatment 3 weeks. The wound is located on the Left,Lateral Lower Leg. The  surface. She mentioned that the [facility] classic felt like it was pulling last week so we will see if the [facility] ready foam is a bit more comfortable. Continue 3 layer compression. Follow-up in 1 week. Electronic Signature(s) Signed: 04/07/2022 10:43:20 AM By: Fredirick Maudlin MD FACS Entered By: Fredirick Maudlin on 04/07/2022 10:43:19 -------------------------------------------------------------------------------- HxROS Details Patient Name: Date of Service: HA [name] DINE 04/07/2022 10:00 A M Medical Record Number: [id number] Patient Account Number: [id number] Date of Birth/Sex: Treating RN: 1945-05-01 (76 y.o. F) Primary Care Provider: Tanda Rockers Other Clinician: Referring Provider: Treating  Provider/Extender: Berniece Salines in Treatment: 3 Information Obtained From Patient LABREA, ECCLESTON ([id number]) [id number] Page 7 of 8 Respiratory Medical History: Positive for: Asthma Cardiovascular Medical History: Positive for: Hypertension Gastrointestinal Medical History: Past Medical History Notes: reflux Neurologic Medical History: Positive for: Seizure Disorder - petit mal seizures Immunizations Pneumococcal Vaccine: Received Pneumococcal Vaccination: Yes Received Pneumococcal Vaccination On or After 60th Birthday: Yes Implantable Devices None Hospitalization / Surgery History Type of Hospitalization/Surgery cataract extraction 2022 oophorectomy 2005 cholecystectomy hernia repair mohs surgery hysterectomy Family and Social History Cancer: Yes - Maternal Grandparents,Paternal Grandparents; Diabetes: Yes; Heart Disease: Yes - Maternal Grandparents; Hypertension: Yes; Seizures: No; Stroke: No; Thyroid Problems: No; Tuberculosis: No; Never smoker; Marital Status - Married; Alcohol Use: Rarely; Drug Use: No History; Caffeine Use: Daily; Financial Concerns: No; Food, Clothing or Shelter Needs: No; Support System Lacking: No; Transportation Concerns: No Electronic Signature(s) Signed: 04/07/2022 11:36:52 AM By: Fredirick Maudlin MD FACS Entered By: Fredirick Maudlin on 04/07/2022 10:41:32 -------------------------------------------------------------------------------- SuperBill Details Patient Name: Date of Service: HA [name] DINE 04/07/2022 Medical Record Number: [id number] Patient Account Number: [id number] Date of Birth/Sex: Treating RN: [birth year] (76 y.o. F) Primary Care Provider: Tanda Rockers Other Clinician: Referring Provider: Treating Provider/Extender: Berniece Salines in Treatment: 3 Diagnosis Coding ICD-10 Codes Code Description [phone] Non-pressure chronic ulcer of other  part of left lower leg with fat layer exposed I10 Essential (primary) hypertension Facility Procedures : ROCKIE, VAWTER Code: ERNADINE ([id number] IC L Description: ) [id number] - DEB SUBQ TISSUE 20 SQ CM/< D-10 Diagnosis Description 97.822 Non-pressure chronic ulcer of other part of left lower leg with fat layer expo Modifier: [id number] sed Quantity: _[id number].pdf Page 8 of 8 1 Physician Procedures : CPT4 Code Description Modifier [id number] - WC PHYS LEVEL 3 - EST PT 25 ICD-10 Diagnosis Description L97.822 Non-pressure chronic ulcer of other part of left lower leg with fat layer exposed I10 Essential (primary) hypertension Quantity: 1 : [id number] 11042 - WC PHYS SUBQ TISS 20 SQ CM ICD-10 Diagnosis Description L97.822 Non-pressure chronic ulcer of other part of left lower leg with fat layer exposed Quantity: 1 Electronic Signature(s) Signed: 04/07/2022 10:43:57 AM By: Fredirick Maudlin MD FACS Entered By: Fredirick Maudlin on 04/07/2022 10:43:57  surface. She mentioned that the [facility] classic felt like it was pulling last week so we will see if the [facility] ready foam is a bit more comfortable. Continue 3 layer compression. Follow-up in 1 week. Electronic Signature(s) Signed: 04/07/2022 10:43:20 AM By: Fredirick Maudlin MD FACS Entered By: Fredirick Maudlin on 04/07/2022 10:43:19 -------------------------------------------------------------------------------- HxROS Details Patient Name: Date of Service: HA [name] DINE 04/07/2022 10:00 A M Medical Record Number: [id number] Patient Account Number: [id number] Date of Birth/Sex: Treating RN: 1945-05-01 (76 y.o. F) Primary Care Provider: Tanda Rockers Other Clinician: Referring Provider: Treating  Provider/Extender: Berniece Salines in Treatment: 3 Information Obtained From Patient LABREA, ECCLESTON ([id number]) [id number] Page 7 of 8 Respiratory Medical History: Positive for: Asthma Cardiovascular Medical History: Positive for: Hypertension Gastrointestinal Medical History: Past Medical History Notes: reflux Neurologic Medical History: Positive for: Seizure Disorder - petit mal seizures Immunizations Pneumococcal Vaccine: Received Pneumococcal Vaccination: Yes Received Pneumococcal Vaccination On or After 60th Birthday: Yes Implantable Devices None Hospitalization / Surgery History Type of Hospitalization/Surgery cataract extraction 2022 oophorectomy 2005 cholecystectomy hernia repair mohs surgery hysterectomy Family and Social History Cancer: Yes - Maternal Grandparents,Paternal Grandparents; Diabetes: Yes; Heart Disease: Yes - Maternal Grandparents; Hypertension: Yes; Seizures: No; Stroke: No; Thyroid Problems: No; Tuberculosis: No; Never smoker; Marital Status - Married; Alcohol Use: Rarely; Drug Use: No History; Caffeine Use: Daily; Financial Concerns: No; Food, Clothing or Shelter Needs: No; Support System Lacking: No; Transportation Concerns: No Electronic Signature(s) Signed: 04/07/2022 11:36:52 AM By: Fredirick Maudlin MD FACS Entered By: Fredirick Maudlin on 04/07/2022 10:41:32 -------------------------------------------------------------------------------- SuperBill Details Patient Name: Date of Service: HA [name] DINE 04/07/2022 Medical Record Number: [id number] Patient Account Number: [id number] Date of Birth/Sex: Treating RN: [birth year] (76 y.o. F) Primary Care Provider: Tanda Rockers Other Clinician: Referring Provider: Treating Provider/Extender: Berniece Salines in Treatment: 3 Diagnosis Coding ICD-10 Codes Code Description [phone] Non-pressure chronic ulcer of other  part of left lower leg with fat layer exposed I10 Essential (primary) hypertension Facility Procedures : ROCKIE, VAWTER Code: ERNADINE ([id number] IC L Description: ) [id number] - DEB SUBQ TISSUE 20 SQ CM/< D-10 Diagnosis Description 97.822 Non-pressure chronic ulcer of other part of left lower leg with fat layer expo Modifier: [id number] sed Quantity: _[id number].pdf Page 8 of 8 1 Physician Procedures : CPT4 Code Description Modifier [id number] 10258 - WC PHYS LEVEL 3 - EST PT 25 ICD-10 Diagnosis Description L97.822 Non-pressure chronic ulcer of other part of left lower leg with fat layer exposed I10 Essential (primary) hypertension Quantity: 1 : [id number] 11042 - WC PHYS SUBQ TISS 20 SQ CM ICD-10 Diagnosis Description L97.822 Non-pressure chronic ulcer of other part of left lower leg with fat layer exposed Quantity: 1 Electronic Signature(s) Signed: 04/07/2022 10:43:57 AM By: Fredirick Maudlin MD FACS Entered By: Fredirick Maudlin on 04/07/2022 10:43:57  this week. Edema control is good. There is a bit of slough on the wound surface, underneath which there is good granulation tissue. Electronic Signature(s) Signed: 04/07/2022 10:41:26 AM By: Fredirick Maudlin MD FACS Entered By: Fredirick Maudlin on 04/07/2022 10:41:25 -------------------------------------------------------------------------------- Physical Exam Details Patient Name: Date of Service: Allison Gross DINE 04/07/2022 10:00 A M Medical Record Number: [id number] Patient Account Number: [id number] Date of Birth/Sex: Treating RN: 09-04-1945 (76 y.o. F) Primary Care Provider: Tanda Rockers Other Clinician: Referring Provider: Treating Provider/Extender: Berniece Salines in Treatment: 3 Constitutional She is hypertensive, but asymptomatic.. . . . No acute distress. Respiratory Normal work of breathing on room air. Notes 04/07/2022: The wound is smaller again this week. Edema control is good. There is a bit of slough on the wound surface, underneath which there is good granulation tissue. IRENA, GAYDOS ([id number]) [id number] Page 3 of 8 Electronic Signature(s) Signed: 04/07/2022 10:42:17 AM By: Fredirick Maudlin MD FACS Entered By: Fredirick Maudlin on 04/07/2022 10:42:16 -------------------------------------------------------------------------------- Physician Orders Details Patient Name: Date of Service: Allison Gross DINE 04/07/2022 10:00 A M Medical Record Number: [id number] Patient Account Number: [id number] Date of  Birth/Sex: Treating RN: 08-May-1945 (76 y.o. [name] Primary Care Provider: Tanda Rockers Other Clinician: Referring Provider: Treating Provider/Extender: Berniece Salines in Treatment: 3 Verbal / Phone Orders: No Diagnosis Coding ICD-10 Coding Code Description [phone] Non-pressure chronic ulcer of other part of left lower leg with fat layer exposed I10 Essential (primary) hypertension Follow-up Appointments ppointment in 1 week. - Dr. Celine Ahr - Room 2 Return A Anesthetic Wound #1 Left,Lateral Lower Leg (In clinic) Topical Lidocaine 4% applied to wound bed Bathing/ Shower/ Hygiene May shower with protection but do not get wound dressing(s) wet. - purchase cast protector from [facility] or [employer] Edema Control - Lymphedema / SCD / Other Left Lower Extremity Elevate legs to the level of the heart or above for 30 minutes daily and/or when sitting, a frequency of: Avoid standing for long periods of time. Wound Treatment Wound #1 - Lower Leg Wound Laterality: Left, Lateral Cleanser: Soap and Water 1 x Per Week/30 Days Discharge Instructions: May shower and wash wound with dial antibacterial soap and water prior to dressing change. Peri-Wound Care: Sween Lotion (Moisturizing lotion) 1 x Per Week/30 Days Discharge Instructions: Apply moisturizing lotion as directed Prim Dressing: Hydrofera Blue Ready Transfer Foam, 2.5x2.5 (in/in) 1 x Per Week/30 Days ary Discharge Instructions: Apply directly to wound bed as directed Secondary Dressing: ABD Pad, 5x9 1 x Per Week/30 Days Discharge Instructions: Apply over primary dressing as directed. Secondary Dressing: Woven Gauze Sponge, Non-Sterile 4x4 in 1 x Per Week/30 Days Discharge Instructions: Apply over primary dressing as directed. Compression Wrap: ThreePress (3 layer compression wrap) 1 x Per Week/30 Days Discharge Instructions: Apply three layer compression as directed. Patient  Medications llergies: Sulfa (Sulfonamide Antibiotics) A Notifications Medication Indication Start End 04/07/2022 lidocaine DOSE topical 4 % cream - cream topical Moor, Gaylene ([id number]) [phone].pdf Page 4 of 8 Electronic Signature(s) Signed: 04/07/2022 11:36:52 AM By: Fredirick Maudlin MD FACS Entered By: Fredirick Maudlin on 04/07/2022 10:42:31 -------------------------------------------------------------------------------- Problem List Details Patient Name: Date of Service: HA [name] DINE 04/07/2022 10:00 A M Medical Record Number: [id number] Patient Account Number: [id number] Date of Birth/Sex: Treating RN: 1945-08-16 (76 y.o. F) Primary Care Provider: Tanda Rockers Other Clinician: Referring Provider: Treating Provider/Extender: Berniece Salines in Treatment: 3 Active Problems ICD-10 Encounter Code Description Active Date

## 2022-04-14 ENCOUNTER — Ambulatory Visit: Payer: PRIVATE HEALTH INSURANCE | Admitting: Neurology

## 2022-04-15 ENCOUNTER — Encounter (HOSPITAL_BASED_OUTPATIENT_CLINIC_OR_DEPARTMENT_OTHER): Payer: Medicare Other | Admitting: General Surgery

## 2022-04-15 DIAGNOSIS — L97822 Non-pressure chronic ulcer of other part of left lower leg with fat layer exposed: Secondary | ICD-10-CM | POA: Diagnosis not present

## 2022-04-15 NOTE — Progress Notes (Signed)
Date of Birth/Sex: Treating RN: February 01, 1946 (76 y.o. Allison Gross Primary Care Kaysey Berndt: Other Clinician: Tanda Rockers Referring Li Bobo: Treating Megen Madewell/Extender: Berniece Salines in Treatment: 4 Active Problems Location of Pain Severity and Description of Pain Patient Has Paino No Site Locations Rate the  pain. Current Pain Level: 0 Pain Management and Medication Current Pain Management: Electronic Signature(s) Signed: 04/15/2022 4:31:58 PM By: Adline Peals Entered By: Adline Peals on 04/15/2022 15:29:38 -------------------------------------------------------------------------------- Patient/Caregiver Education Details Patient Name: Date of Service: HA Allison Gross DINE 12/28/2023andnbsp3:00 PM Medical Record Number: [id number] Patient Account Number: [id number] Date of Birth/Gender: Treating RN: 1945/09/05 (76 y.o. Allison Gross Primary Care Physician: Tanda Rockers Other Clinician: Referring Physician: Treating Physician/Extender: Berniece Salines in Treatment: 4 Education Assessment Education Provided To: Patient Education Topics Provided Wound Debridement: Methods: Explain/Verbal Responses: Reinforcements needed, State content correctly Electronic Signature(s) Signed: 04/15/2022 4:31:58 PM By: Adline Peals Entered By: Adline Peals on 04/15/2022 15:36:48 Gross, Allison ([id number]) [id number] Page 6 of 7 -------------------------------------------------------------------------------- Wound Assessment Details Patient Name: Date of Service: Allison Gross DINE 04/15/2022 3:00 PM Medical Record Number: [id number] Patient Account Number: [id number] Date of Birth/Sex: Treating RN: 1945-10-13 (76 y.o. Allison Gross Primary Care Allison Gross: Tanda Rockers Other Clinician: Referring Edita Weyenberg: Treating Allison Gross/Extender: Berniece Salines in Treatment: 4 Wound Status Wound Number: 1 Primary Etiology: Trauma, Other Wound Location: Left, Lateral Lower Leg Wound Status: Open Wounding Event: Trauma Comorbid History: Asthma, Hypertension, Seizure Disorder Date Acquired: 01/23/2022 Weeks Of Treatment: 4 Clustered Wound: No Photos Wound Measurements Length: (cm) 0.9 Width:  (cm) 0.7 Depth: (cm) 0.1 Area: (cm) 0.495 Volume: (cm) 0.049 % Reduction in Area: 90.9% % Reduction in Volume: 97% Epithelialization: Small (1-33%) Tunneling: No Undermining: No Wound Description Classification: Full Thickness Without Exposed Support Structures Wound Margin: Distinct, outline attached Exudate Amount: Medium Exudate Type: Serosanguineous Exudate Color: red, brown Foul Odor After Cleansing: No Slough/Fibrino Yes Wound Bed Granulation Amount: Large (67-100%) Exposed Structure Granulation Quality: Red Fascia Exposed: No Necrotic Amount: Small (1-33%) Fat Layer (Subcutaneous Tissue) Exposed: Yes Necrotic Quality: Adherent Slough Tendon Exposed: No Muscle Exposed: No Joint Exposed: No Bone Exposed: No Periwound Skin Texture Texture Color No Abnormalities Noted: Yes No Abnormalities Noted: Yes Moisture Temperature / Pain No Abnormalities Noted: Yes Temperature: No Abnormality Treatment Notes Wound #1 (Lower Leg) Wound Laterality: Left, Lateral Cleanser Soap and Water Discharge Instruction: May shower and wash wound with dial antibacterial soap and water prior to dressing change. Peri-Wound Care Gross, Allison ([id number]) [id number] Page 7 of 7 Sween Lotion (Moisturizing lotion) Discharge Instruction: Apply moisturizing lotion as directed Topical Primary Dressing Hydrofera Blue Ready Transfer Foam, 2.5x2.5 (in/in) Discharge Instruction: Apply directly to wound bed as directed Secondary Dressing ABD Pad, 5x9 Discharge Instruction: Apply over primary dressing as directed. Woven Gauze Sponge, Non-Sterile 4x4 in Discharge Instruction: Apply over primary dressing as directed. Secured With Compression Wrap ThreePress (3 layer compression wrap) Discharge Instruction: Apply three layer compression as directed. Compression Stockings Add-Ons Electronic Signature(s) Signed: 04/15/2022 4:31:58 PM By: Adline Peals Entered  By: Adline Peals on 04/15/2022 15:37:41 -------------------------------------------------------------------------------- Vitals Details Patient Name: Date of Service: HA Allison Gross DINE 04/15/2022 3:00 PM Medical Record Number: [id number] Patient Account Number: [id number] Date of Birth/Sex: Treating RN: 01-05-1946 (76 y.o. Allison Gross Primary Care Knight Oelkers: Tanda Rockers Other Clinician: Referring Loreli Debruler: Treating Cela Newcom/Extender: Berniece Salines in Treatment: 4 Vital Signs Time Taken: 15:29 Temperature (F): 97.6 Height (  SHRONDA, Gross ([id number]) [id number] Page 1 of 7 Visit Report for 04/15/2022 Arrival Information Details Patient Name: Date of Service: Allison Gross DINE 04/15/2022 3:00 PM Medical Record Number: [id number] Patient Account Number: [id number] Date of Birth/Sex: Treating RN: 02-Jun-1945 (76 y.o. Allison Gross Primary Care Allison Gross: Tanda Rockers Other Clinician: Referring Allison Gross: Treating Allison Gross/Extender: Berniece Salines in Treatment: 4 Visit Information History Since Last Visit Added or deleted any medications: No Patient Arrived: Ambulatory Any new allergies or adverse reactions: No Arrival Time: 15:26 Had a fall or experienced change in No Accompanied By: husband activities of daily living that may affect Transfer Assistance: None risk of falls: Patient Identification Verified: Yes Signs or symptoms of abuse/neglect since last visito No Secondary Verification Process Completed: Yes Hospitalized since last visit: No Patient Requires Transmission-Based Precautions: No Implantable device outside of the clinic excluding No Patient Has Alerts: No cellular tissue based products placed in the center since last visit: Has Dressing in Place as Prescribed: Yes Has Compression in Place as Prescribed: Yes Pain Present Now: No Electronic Signature(s) Signed: 04/15/2022 4:31:58 PM By: Adline Peals Entered By: Adline Peals on 04/15/2022 15:29:33 -------------------------------------------------------------------------------- Compression Therapy Details Patient Name: Date of Service: Allison Gross DINE 04/15/2022 3:00 PM Medical Record Number: [id number] Patient Account Number: [id number] Date of Birth/Sex: Treating RN: 08-29-1945 (76 y.o. Allison Gross Primary Care Ramces Shomaker: Tanda Rockers Other Clinician: Referring Tullio Chausse: Treating Winton Offord/Extender: Berniece Salines in  Treatment: 4 Compression Therapy Performed for Wound Assessment: Wound #1 Left,Lateral Lower Leg Performed By: Clinician Adline Peals, RN Compression Type: Three Layer Post Procedure Diagnosis Same as Pre-procedure Electronic Signature(s) Signed: 04/15/2022 4:31:58 PM By: Adline Peals Entered By: Adline Peals on 04/15/2022 15:50:46 Ponds, Alexander Bergeron ([id number]) [id number].pdf Page 2 of 7 -------------------------------------------------------------------------------- Encounter Discharge Information Details Patient Name: Date of Service: Allison Gross DINE 04/15/2022 3:00 PM Medical Record Number: [id number] Patient Account Number: [id number] Date of Birth/Sex: Treating RN: [birth year] (76 y.o. Allison Gross Primary Care Elzy Tomasello: Tanda Rockers Other Clinician: Referring Keoni Havey: Treating Arles Rumbold/Extender: Berniece Salines in Treatment: 4 Encounter Discharge Information Items Post Procedure Vitals Discharge Condition: Stable Temperature (F): 97.6 Ambulatory Status: Ambulatory Pulse (bpm): 75 Discharge Destination: Home Respiratory Rate (breaths/min): 18 Transportation: Private Auto Blood Pressure (mmHg): 133/74 Accompanied By: husband Schedule Follow-up Appointment: Yes Clinical Summary of Care: Patient Declined Electronic Signature(s) Signed: 04/15/2022 4:31:58 PM By: Adline Peals Entered By: Adline Peals on 04/15/2022 15:51:26 -------------------------------------------------------------------------------- Lower Extremity Assessment Details Patient Name: Date of Service: Allison Gross DINE 04/15/2022 3:00 PM Medical Record Number: [id number] Patient Account Number: [id number] Date of Birth/Sex: Treating RN: 15-Mar-1946 (16 y.o. Allison Gross Primary Care Eddith Mentor: Tanda Rockers Other Clinician: Referring Emidio Warrell: Treating Gala Padovano/Extender: Berniece Salines  in Treatment: 4 Edema Assessment Assessed: Shirlyn Goltz: No] Patrice Paradise: No] Edema: [Left: Ye] [Right: s] Calf Left: Right: Point of Measurement: 30 cm From Medial Instep 33.9 cm Ankle Left: Right: Point of Measurement: 10 cm From Medial Instep 19.4 cm Vascular Assessment Pulses: Dorsalis Pedis Palpable: [Left:Yes] Electronic Signature(s) Signed: 04/15/2022 4:31:58 PM By: Adline Peals Entered By: Adline Peals on 04/15/2022 15:35:52 Multi Wound Chart Details -------------------------------------------------------------------------------- Melony Overly ([id number]) [id number] Page 3 of 7 Patient Name: Date of Service: Allison Gross DINE 04/15/2022 3:00 PM Medical Record Number: [id number] Patient Account Number: [id number] Date of Birth/Sex: Treating RN: 1945/09/26 (76 y.o. F) Primary Care Aleighna Wojtas: Tanda Rockers Other Clinician: Referring Donold Marotto: Treating  Tremane Spurgeon/Extender: Berniece Salines in Treatment: 4 Vital Signs Height(in): 60 Pulse(bpm): 75 Weight(lbs): 154 Blood Pressure(mmHg): 133/74 Body Mass Index(BMI): 30.1 Temperature(F): 97.6 Respiratory Rate(breaths/min): 18 Wound Assessments Wound Number: 1 N/A N/A Photos: N/A N/A Left, Lateral Lower Leg N/A N/A Wound Location: Trauma N/A N/A Wounding Event: Trauma, Other N/A N/A Primary Etiology: Asthma, Hypertension, Seizure N/A N/A Comorbid History: Disorder 01/23/2022 N/A N/A Date Acquired: 4 N/A N/A Weeks of Treatment: Open N/A N/A Wound Status: No N/A N/A Wound Recurrence: 0.9x0.7x0.1 N/A N/A Measurements L x W x D (cm) 0.495 N/A N/A A (cm) : rea 0.049 N/A N/A Volume (cm) : 90.90% N/A N/A % Reduction in A rea: 97.00% N/A N/A % Reduction in Volume: Full Thickness Without Exposed N/A N/A Classification: Support Structures Medium N/A N/A Exudate A mount: Serosanguineous N/A N/A Exudate Type: red, brown N/A N/A Exudate  Color: Distinct, outline attached N/A N/A Wound Margin: Large (67-100%) N/A N/A Granulation A mount: Red N/A N/A Granulation Quality: Small (1-33%) N/A N/A Necrotic A mount: Fat Layer (Subcutaneous Tissue): Yes N/A N/A Exposed Structures: Fascia: No Tendon: No Muscle: No Joint: No Bone: No Small (1-33%) N/A N/A Epithelialization: Debridement - Selective/Open Wound N/A N/A Debridement: Pre-procedure Verification/Time Out 15:39 N/A N/A Taken: Lidocaine 4% Topical Solution N/A N/A Pain Control: Necrotic/Eschar, Slough N/A N/A Tissue Debrided: Non-Viable Tissue N/A N/A Level: 0.63 N/A N/A Debridement A (sq cm): rea Curette N/A N/A Instrument: Large N/A N/A Bleeding: Pressure N/A N/A Hemostasis A chieved: Procedure was tolerated well N/A N/A Debridement Treatment Response: 0.9x0.7x0.1 N/A N/A Post Debridement Measurements L x W x D (cm) 0.049 N/A N/A Post Debridement Volume: (cm) No Abnormalities Noted N/A N/A Periwound Skin Texture: No Abnormalities Noted N/A N/A Periwound Skin Moisture: Erythema: Yes N/A N/A Periwound Skin Color: No Abnormality N/A N/A Temperature: Debridement N/A N/A Procedures Performed: Treatment Notes Electronic Signature(s) Signed: 04/15/2022 3:42:46 PM By: Fredirick Maudlin MD FACS Maramec, Treana ([id number]) PM By: Fredirick Maudlin MD FACS [phone].pdf Page 4 of 7 Signed: 04/15/2022 3:42:46 Entered By: Fredirick Maudlin on 04/15/2022 15:42:46 -------------------------------------------------------------------------------- Multi-Disciplinary Care Plan Details Patient Name: Date of Service: Allison Gross DINE 04/15/2022 3:00 PM Medical Record Number: [id number] Patient Account Number: [id number] Date of Birth/Sex: Treating RN: [birth year] (76 y.o. Allison Gross Primary Care Sela Falk: Tanda Rockers Other Clinician: Referring Chiffon Kittleson: Treating Mikita Lesmeister/Extender: Berniece Salines in Treatment: 4 Active Inactive Orientation to the Wound Care Program Nursing Diagnoses: Knowledge deficit related to the wound healing center program Goals: Patient/caregiver will verbalize understanding of the [place] Program Date Initiated: 03/16/2022 Target Resolution Date: 06/17/2022 Goal Status: Active Interventions: Provide education on orientation to the wound center Notes: Wound/Skin Impairment Nursing Diagnoses: Impaired tissue integrity Knowledge deficit related to ulceration/compromised skin integrity Goals: Patient/caregiver will verbalize understanding of skin care regimen Date Initiated: 03/16/2022 Target Resolution Date: 06/17/2022 Goal Status: Active Ulcer/skin breakdown will have a volume reduction of 30% by week 4 Date Initiated: 03/16/2022 Target Resolution Date: 06/17/2022 Goal Status: Active Interventions: Assess patient/caregiver ability to obtain necessary supplies Assess patient/caregiver ability to perform ulcer/skin care regimen upon admission and as needed Assess ulceration(s) every visit Provide education on ulcer and skin care Notes: Electronic Signature(s) Signed: 04/15/2022 4:31:58 PM By: Adline Peals Entered By: Adline Peals on 04/15/2022 15:36:36 -------------------------------------------------------------------------------- Pain Assessment Details Patient Name: Date of Service: Allison Gross DINE 04/15/2022 3:00 PM Medical Record Number: [id number] Patient Account Number: [id number] Allison Gross, [place] ([id number]) [phone].pdf Page 5 of 7  Date of Birth/Sex: Treating RN: February 01, 1946 (76 y.o. Allison Gross Primary Care Kaysey Berndt: Other Clinician: Tanda Rockers Referring Li Bobo: Treating Megen Madewell/Extender: Berniece Salines in Treatment: 4 Active Problems Location of Pain Severity and Description of Pain Patient Has Paino No Site Locations Rate the  pain. Current Pain Level: 0 Pain Management and Medication Current Pain Management: Electronic Signature(s) Signed: 04/15/2022 4:31:58 PM By: Adline Peals Entered By: Adline Peals on 04/15/2022 15:29:38 -------------------------------------------------------------------------------- Patient/Caregiver Education Details Patient Name: Date of Service: HA Allison Gross DINE 12/28/2023andnbsp3:00 PM Medical Record Number: [id number] Patient Account Number: [id number] Date of Birth/Gender: Treating RN: 1945/09/05 (76 y.o. Allison Gross Primary Care Physician: Tanda Rockers Other Clinician: Referring Physician: Treating Physician/Extender: Berniece Salines in Treatment: 4 Education Assessment Education Provided To: Patient Education Topics Provided Wound Debridement: Methods: Explain/Verbal Responses: Reinforcements needed, State content correctly Electronic Signature(s) Signed: 04/15/2022 4:31:58 PM By: Adline Peals Entered By: Adline Peals on 04/15/2022 15:36:48 Gross, Allison ([id number]) [id number] Page 6 of 7 -------------------------------------------------------------------------------- Wound Assessment Details Patient Name: Date of Service: Allison Gross DINE 04/15/2022 3:00 PM Medical Record Number: [id number] Patient Account Number: [id number] Date of Birth/Sex: Treating RN: 1945-10-13 (76 y.o. Allison Gross Primary Care Allison Gross: Tanda Rockers Other Clinician: Referring Edita Weyenberg: Treating Allison Gross/Extender: Berniece Salines in Treatment: 4 Wound Status Wound Number: 1 Primary Etiology: Trauma, Other Wound Location: Left, Lateral Lower Leg Wound Status: Open Wounding Event: Trauma Comorbid History: Asthma, Hypertension, Seizure Disorder Date Acquired: 01/23/2022 Weeks Of Treatment: 4 Clustered Wound: No Photos Wound Measurements Length: (cm) 0.9 Width:  (cm) 0.7 Depth: (cm) 0.1 Area: (cm) 0.495 Volume: (cm) 0.049 % Reduction in Area: 90.9% % Reduction in Volume: 97% Epithelialization: Small (1-33%) Tunneling: No Undermining: No Wound Description Classification: Full Thickness Without Exposed Support Structures Wound Margin: Distinct, outline attached Exudate Amount: Medium Exudate Type: Serosanguineous Exudate Color: red, brown Foul Odor After Cleansing: No Slough/Fibrino Yes Wound Bed Granulation Amount: Large (67-100%) Exposed Structure Granulation Quality: Red Fascia Exposed: No Necrotic Amount: Small (1-33%) Fat Layer (Subcutaneous Tissue) Exposed: Yes Necrotic Quality: Adherent Slough Tendon Exposed: No Muscle Exposed: No Joint Exposed: No Bone Exposed: No Periwound Skin Texture Texture Color No Abnormalities Noted: Yes No Abnormalities Noted: Yes Moisture Temperature / Pain No Abnormalities Noted: Yes Temperature: No Abnormality Treatment Notes Wound #1 (Lower Leg) Wound Laterality: Left, Lateral Cleanser Soap and Water Discharge Instruction: May shower and wash wound with dial antibacterial soap and water prior to dressing change. Peri-Wound Care Gross, Allison ([id number]) [id number] Page 7 of 7 Sween Lotion (Moisturizing lotion) Discharge Instruction: Apply moisturizing lotion as directed Topical Primary Dressing Hydrofera Blue Ready Transfer Foam, 2.5x2.5 (in/in) Discharge Instruction: Apply directly to wound bed as directed Secondary Dressing ABD Pad, 5x9 Discharge Instruction: Apply over primary dressing as directed. Woven Gauze Sponge, Non-Sterile 4x4 in Discharge Instruction: Apply over primary dressing as directed. Secured With Compression Wrap ThreePress (3 layer compression wrap) Discharge Instruction: Apply three layer compression as directed. Compression Stockings Add-Ons Electronic Signature(s) Signed: 04/15/2022 4:31:58 PM By: Adline Peals Entered  By: Adline Peals on 04/15/2022 15:37:41 -------------------------------------------------------------------------------- Vitals Details Patient Name: Date of Service: HA Allison Gross DINE 04/15/2022 3:00 PM Medical Record Number: [id number] Patient Account Number: [id number] Date of Birth/Sex: Treating RN: 01-05-1946 (76 y.o. Allison Gross Primary Care Knight Oelkers: Tanda Rockers Other Clinician: Referring Loreli Debruler: Treating Cela Newcom/Extender: Berniece Salines in Treatment: 4 Vital Signs Time Taken: 15:29 Temperature (F): 97.6 Height (

## 2022-04-15 NOTE — Progress Notes (Signed)
HA [name] DINE 04/15/2022 3:00 PM Medical Record Number: [id number] Patient Account Number: [id number] Date of Birth/Sex: Treating RN: [birth year] (76 y.o. F) Primary Care Provider: Tanda Rockers Other Clinician: Referring Provider: Treating Provider/Extender: Berniece Salines in Treatment: 4 Information Obtained From Patient UCHECHI, DENISON ([id number]) [id number] Page 7 of 8 Respiratory Medical History: Positive for: Asthma Cardiovascular Medical History: Positive for: Hypertension Gastrointestinal Medical History: Past Medical History  Notes: reflux Neurologic Medical History: Positive for: Seizure Disorder - petit mal seizures Immunizations Pneumococcal Vaccine: Received Pneumococcal Vaccination: Yes Received Pneumococcal Vaccination On or After 60th Birthday: Yes Implantable Devices None Hospitalization / Surgery History Type of Hospitalization/Surgery cataract extraction 2022 oophorectomy 2005 cholecystectomy hernia repair mohs surgery hysterectomy Family and Social History Cancer: Yes - Maternal Grandparents,Paternal Grandparents; Diabetes: Yes; Heart Disease: Yes - Maternal Grandparents; Hypertension: Yes; Seizures: No; Stroke: No; Thyroid Problems: No; Tuberculosis: No; Never smoker; Marital Status - Married; Alcohol Use: Rarely; Drug Use: No History; Caffeine Use: Daily; Financial Concerns: No; Food, Clothing or Shelter Needs: No; Support System Lacking: No; Transportation Concerns: No Engineer, maintenance) Signed: 04/15/2022 4:02:50 PM By: Fredirick Maudlin MD FACS Entered By: Fredirick Maudlin on 04/15/2022 15:43:50 -------------------------------------------------------------------------------- SuperBill Details Patient Name: Date of Service: HA [name] DINE 04/15/2022 Medical Record Number: [id number] Patient Account Number: [id number] Date of Birth/Sex: Treating RN: January 21, 1946 (76 y.o. F) Primary Care Provider: Tanda Rockers Other Clinician: Referring Provider: Treating Provider/Extender: Berniece Salines in Treatment: 4 Diagnosis Coding ICD-10 Codes Code Description [phone] Non-pressure chronic ulcer of other part of left lower leg with fat layer exposed I10 Essential (primary) hypertension Facility Procedures : HELANA, MACBRIDE Code: ERNADINE ([id number] I Description: 1) [id number] - DEBRIDE WOUND 1ST 20 SQ CM OR < CD-10 Diagnosis Description L97.822 Non-pressure chronic ulcer of other part of left lower leg with fat layer expose Modifier:  [id number]_Physician d Quantity: _[id number].pdf Page 8 of 8 1 Physician Procedures : CPT4 Code Description Modifier [id number] - WC PHYS LEVEL 3 - EST PT 25 ICD-10 Diagnosis Description L97.822 Non-pressure chronic ulcer of other part of left lower leg with fat layer exposed I10 Essential (primary) hypertension Quantity: 1 : [id number] - WC PHYS DEBR WO ANESTH 20 SQ CM ICD-10 Diagnosis Description L97.822 Non-pressure chronic ulcer of other part of left lower leg with fat layer exposed Quantity: 1 Electronic Signature(s) Signed: 04/15/2022 3:55:38 PM By: Fredirick Maudlin MD FACS Entered By: Fredirick Maudlin on 04/15/2022 15:55:38  Edema control is good. There is a bit of slough on the wound surface, underneath which there is good granulation tissue. 04/15/2022: The wound continues to contract. It is nearly flush with the surrounding skin surface. Periwound skin is in good condition and edema control is excellent. Electronic Signature(s) Signed: 04/15/2022 3:43:29 PM By: Fredirick Maudlin MD FACS Entered By: Fredirick Maudlin on 04/15/2022 15:43:29 -------------------------------------------------------------------------------- Physical Exam Details Patient Name: Date of Service: Gita Kudo DINE 04/15/2022 3:00 PM Medical Record Number: [id number] Patient Account Number: [id number] Date of Birth/Sex: Treating RN: 1946-01-25 (76 y.o. F) Primary Care Provider: Tanda Rockers Other Clinician: Referring Provider: Treating Provider/Extender: Berniece Salines in Treatment: 4 Constitutional . . . . No acute distress. Respiratory Normal work of breathing on room air. Notes AVON, MOLOCK ([id number]) [id number] Page 3 of 8 04/15/2022: The wound continues to contract. It is nearly flush with the surrounding skin surface. Periwound skin is in good condition and edema control is excellent. Electronic Signature(s) Signed: 04/15/2022 3:44:25 PM By: Fredirick Maudlin MD FACS Entered By: Fredirick Maudlin on 04/15/2022 15:44:25 -------------------------------------------------------------------------------- Physician Orders Details Patient Name: Date of  Service: Gita Kudo DINE 04/15/2022 3:00 PM Medical Record Number: [id number] Patient Account Number: [id number] Date of Birth/Sex: Treating RN: 07-Feb-1946 (76 y.o. [name] Primary Care Provider: Tanda Rockers Other Clinician: Referring Provider: Treating Provider/Extender: Berniece Salines in Treatment: 4 Verbal / Phone Orders: No Diagnosis Coding ICD-10 Coding Code Description [phone] Non-pressure chronic ulcer of other part of left lower leg with fat layer exposed I10 Essential (primary) hypertension Follow-up Appointments ppointment in 1 week. - Dr. Celine Ahr - Room 2 Return A Anesthetic Wound #1 Left,Lateral Lower Leg (In clinic) Topical Lidocaine 4% applied to wound bed Bathing/ Shower/ Hygiene May shower with protection but do not get wound dressing(s) wet. Protect dressing(s) with water repellant cover (for example, large plastic bag) or a cast cover and may then take shower. Edema Control - Lymphedema / SCD / Other Left Lower Extremity Avoid standing for long periods of time. Wound Treatment Wound #1 - Lower Leg Wound Laterality: Left, Lateral Cleanser: Soap and Water 1 x Per Week/30 Days Discharge Instructions: May shower and wash wound with dial antibacterial soap and water prior to dressing change. Peri-Wound Care: Sween Lotion (Moisturizing lotion) 1 x Per Week/30 Days Discharge Instructions: Apply moisturizing lotion as directed Prim Dressing: Hydrofera Blue Ready Transfer Foam, 2.5x2.5 (in/in) 1 x Per Week/30 Days ary Discharge Instructions: Apply directly to wound bed as directed Secondary Dressing: ABD Pad, 5x9 1 x Per Week/30 Days Discharge Instructions: Apply over primary dressing as directed. Secondary Dressing: Woven Gauze Sponge, Non-Sterile 4x4 in 1 x Per Week/30 Days Discharge Instructions: Apply over primary dressing as directed. Compression Wrap: ThreePress (3 layer compression wrap) 1 x Per Week/30 Days Discharge  Instructions: Apply three layer compression as directed. Patient Medications llergies: Sulfa (Sulfonamide Antibiotics) A Notifications Medication Indication Start End 04/15/2022 lidocaine DOSE topical 4 % cream - cream topical Grudzinski, Jazline ([id number]) [phone].pdf Page 4 of 8 Electronic Signature(s) Signed: 04/15/2022 4:02:50 PM By: Fredirick Maudlin MD FACS Entered By: Fredirick Maudlin on 04/15/2022 15:44:41 -------------------------------------------------------------------------------- Problem List Details Patient Name: Date of Service: HA [name] DINE 04/15/2022 3:00 PM Medical Record Number: [id number] Patient Account Number: [id number] Date of Birth/Sex: Treating RN: Aug 02, 1945 (76 y.o. F) Primary Care Provider: Tanda Rockers Other Clinician: Referring Provider: Treating Provider/Extender: Berniece Salines in Treatment: 4 Active Problems ICD-10  HA [name] DINE 04/15/2022 3:00 PM Medical Record Number: [id number] Patient Account Number: [id number] Date of Birth/Sex: Treating RN: [birth year] (76 y.o. F) Primary Care Provider: Tanda Rockers Other Clinician: Referring Provider: Treating Provider/Extender: Berniece Salines in Treatment: 4 Information Obtained From Patient UCHECHI, DENISON ([id number]) [id number] Page 7 of 8 Respiratory Medical History: Positive for: Asthma Cardiovascular Medical History: Positive for: Hypertension Gastrointestinal Medical History: Past Medical History  Notes: reflux Neurologic Medical History: Positive for: Seizure Disorder - petit mal seizures Immunizations Pneumococcal Vaccine: Received Pneumococcal Vaccination: Yes Received Pneumococcal Vaccination On or After 60th Birthday: Yes Implantable Devices None Hospitalization / Surgery History Type of Hospitalization/Surgery cataract extraction 2022 oophorectomy 2005 cholecystectomy hernia repair mohs surgery hysterectomy Family and Social History Cancer: Yes - Maternal Grandparents,Paternal Grandparents; Diabetes: Yes; Heart Disease: Yes - Maternal Grandparents; Hypertension: Yes; Seizures: No; Stroke: No; Thyroid Problems: No; Tuberculosis: No; Never smoker; Marital Status - Married; Alcohol Use: Rarely; Drug Use: No History; Caffeine Use: Daily; Financial Concerns: No; Food, Clothing or Shelter Needs: No; Support System Lacking: No; Transportation Concerns: No Engineer, maintenance) Signed: 04/15/2022 4:02:50 PM By: Fredirick Maudlin MD FACS Entered By: Fredirick Maudlin on 04/15/2022 15:43:50 -------------------------------------------------------------------------------- SuperBill Details Patient Name: Date of Service: HA [name] DINE 04/15/2022 Medical Record Number: [id number] Patient Account Number: [id number] Date of Birth/Sex: Treating RN: January 21, 1946 (76 y.o. F) Primary Care Provider: Tanda Rockers Other Clinician: Referring Provider: Treating Provider/Extender: Berniece Salines in Treatment: 4 Diagnosis Coding ICD-10 Codes Code Description [phone] Non-pressure chronic ulcer of other part of left lower leg with fat layer exposed I10 Essential (primary) hypertension Facility Procedures : HELANA, MACBRIDE Code: ERNADINE ([id number] I Description: 1) [id number] - DEBRIDE WOUND 1ST 20 SQ CM OR < CD-10 Diagnosis Description L97.822 Non-pressure chronic ulcer of other part of left lower leg with fat layer expose Modifier:  [id number]_Physician d Quantity: _[id number].pdf Page 8 of 8 1 Physician Procedures : CPT4 Code Description Modifier [id number] 23300 - WC PHYS LEVEL 3 - EST PT 25 ICD-10 Diagnosis Description L97.822 Non-pressure chronic ulcer of other part of left lower leg with fat layer exposed I10 Essential (primary) hypertension Quantity: 1 : [id number] 35456 - WC PHYS DEBR WO ANESTH 20 SQ CM ICD-10 Diagnosis Description L97.822 Non-pressure chronic ulcer of other part of left lower leg with fat layer exposed Quantity: 1 Electronic Signature(s) Signed: 04/15/2022 3:55:38 PM By: Fredirick Maudlin MD FACS Entered By: Fredirick Maudlin on 04/15/2022 15:55:38  SYREETA, FIGLER ([id number]) [id number] Page 1 of 8 Visit Report for 04/15/2022 Chief Complaint Document Details Patient Name: Date of Service: Gita Kudo DINE 04/15/2022 3:00 PM Medical Record Number: [id number] Patient Account Number: [id number] Date of Birth/Sex: Treating RN: 1945/12/13 (76 y.o. F) Primary Care Provider: Tanda Rockers Other Clinician: Referring Provider: Treating Provider/Extender: Berniece Salines in Treatment: 4 Information Obtained from: Patient Chief Complaint Patient seen for complaints of Non-Healing Wound. Electronic Signature(s) Signed: 04/15/2022 3:42:51 PM By: Fredirick Maudlin MD FACS Entered By: Fredirick Maudlin on 04/15/2022 15:42:51 -------------------------------------------------------------------------------- Debridement Details Patient Name: Date of Service: HA [name] DINE 04/15/2022 3:00 PM Medical Record Number: [id number] Patient Account Number: [id number] Date of Birth/Sex: Treating RN: [birth year] (76 y.o. [name] Primary Care Provider: Tanda Rockers Other Clinician: Referring Provider: Treating Provider/Extender: Berniece Salines in Treatment: 4 Debridement Performed for Assessment: Wound #1 Left,Lateral Lower Leg Performed By: Physician Fredirick Maudlin, MD Debridement Type: Debridement Level of Consciousness (Pre-procedure): Awake and Alert Pre-procedure Verification/Time Out Yes - 15:39 Taken: Start Time: 15:39 Pain Control: Lidocaine 4% T opical Solution T Area Debrided (L x W): otal 0.9 (cm) x 0.7 (cm) = 0.63 (cm) Tissue and other material debrided: Non-Viable, Eschar, Slough, Slough Level: Non-Viable Tissue Debridement Description: Selective/Open Wound Instrument: Curette Bleeding: Large Hemostasis Achieved: Pressure Response to Treatment: Procedure was tolerated well Level of Consciousness (Post- Awake and  Alert procedure): Post Debridement Measurements of Total Wound Length: (cm) 0.9 Width: (cm) 0.7 Depth: (cm) 0.1 Volume: (cm) 0.049 Character of Wound/Ulcer Post Debridement: Improved Post Procedure Diagnosis Same as Pre-procedure HALPIN, Sendy ([id number]) [id number] Page 2 of 8 Notes scribed for Dr. Celine Ahr by Adline Peals, RN Electronic Signature(s) Signed: 04/15/2022 4:02:50 PM By: Fredirick Maudlin MD FACS Signed: 04/15/2022 4:31:58 PM By: Sabas Sous By: Adline Peals on 04/15/2022 15:40:55 -------------------------------------------------------------------------------- HPI Details Patient Name: Date of Service: HA [name] DINE 04/15/2022 3:00 PM Medical Record Number: [id number] Patient Account Number: [id number] Date of Birth/Sex: Treating RN: 11/24/1945 (76 y.o. F) Primary Care Provider: Tanda Rockers Other Clinician: Referring Provider: Treating Provider/Extender: Berniece Salines in Treatment: 4 History of Present Illness HPI Description: ADMISSION 03/16/2022 This is a 76 year old woman who struck her leg on a car door near the beginning of October. She was seen at an urgent care in [place], [place] and given a course of doxycycline. Her symptoms fail to improve and she saw a different urgent care provider who gave a 10-day course of Keflex without improvement. She presented to the emergency department on March 03, 2022 with concern for cellulitis and a wound infection. Due to swelling in the legs, a DVT scan was performed which was negative. She was given an injection of dalbavancin and referred to infectious disease. She saw ID on November 21. She was prescribed a 10-day course of cefadroxil and referred to the wound care center for further evaluation and management. ABI in clinic today was 1.2. On her left lower leg, there is a wound just lateral to the anterior tibial surface.  There is some rubbery slough accumulation. She has 2+ pitting edema. No purulent drainage or malodor. 03/24/2022: The wound is a bit smaller today and less painful. There is some slough on the surface. Edema control is improved. 12/13; the wound is 80% covered in adherent slough. This requires debridement. Using silver alginate and 3 layer. Her edema control is good 04/07/2022: The wound is smaller again this week.  Encounter Code Description Active Date MDM Diagnosis L97.822 Non-pressure chronic ulcer of other part of left lower leg with fat layer 03/16/2022 No Yes exposed [place] (primary) hypertension 03/16/2022 No Yes Inactive Problems Resolved Problems Electronic Signature(s) Signed: 04/15/2022 3:42:41 PM By: Fredirick Maudlin MD FACS Entered By: Fredirick Maudlin on 04/15/2022 15:42:40 -------------------------------------------------------------------------------- Progress Note Details Patient Name: Date of Service: Gita Kudo DINE 04/15/2022 3:00 PM Medical Record Number: [id number] Patient Account Number: [id number] Date of Birth/Sex: Treating RN: 10/19/1945 (76 y.o. F) Primary Care Provider: Tanda Rockers Other Clinician: Referring Provider: Treating Provider/Extender: Berniece Salines in Treatment: 4 Subjective Chief Complaint Information obtained from Patient Patient seen for complaints of Non-Healing Wound. History of Present Illness  (HPI) ADMISSION 03/16/2022 This is a 76 year old woman who struck her leg on a car door near the beginning of October. She was seen at an urgent care in [place], [place] and given [place], [place] ([id number]) [id number] Page 5 of 8 a course of doxycycline. Her symptoms fail to improve and she saw a different urgent care provider who gave a 10-day course of Keflex without improvement. She presented to the emergency department on March 03, 2022 with concern for cellulitis and a wound infection. Due to swelling in the legs, a DVT scan was performed which was negative. She was given an injection of dalbavancin and referred to infectious disease. She saw ID on November 21. She was prescribed a 10-day course of cefadroxil and referred to the wound care center for further evaluation and management. ABI in clinic today was 1.2. On her left lower leg, there is a wound just lateral to the anterior tibial surface. There is some rubbery slough accumulation. She has 2+ pitting edema. No purulent drainage or malodor. 03/24/2022: The wound is a bit smaller today and less painful. There is some slough on the surface. Edema control is improved. 12/13; the wound is 80% covered in adherent slough. This requires debridement. Using silver alginate and 3 layer. Her edema control is good 04/07/2022: The wound is smaller again this week. Edema control is good. There is a bit of slough on the wound surface, underneath which there is good granulation tissue. 04/15/2022: The wound continues to contract. It is nearly flush with the surrounding skin surface. Periwound skin is in good condition and edema control is excellent. Patient History Information obtained from Patient. Family History Cancer - Maternal Grandparents,Paternal Grandparents, Diabetes, Heart Disease - Maternal Grandparents, Hypertension, No family history of Seizures, Stroke, Thyroid Problems, Tuberculosis. Social  History Never smoker, Marital Status - Married, Alcohol Use - Rarely, Drug Use - No History, Caffeine Use - Daily. Medical History Respiratory Patient has history of Asthma Cardiovascular Patient has history of Hypertension Neurologic Patient has history of Seizure Disorder - petit mal seizures Hospitalization/Surgery History - cataract extraction 2022. - oophorectomy 2005. - cholecystectomy. - hernia repair. - mohs surgery. - hysterectomy. Medical A Surgical History Notes nd Gastrointestinal reflux Objective Constitutional No acute distress. Vitals Time Taken: 3:29 PM, Height: 60 in, Weight: 154 lbs, BMI: 30.1, Temperature: 97.6 F, Pulse: 75 bpm, Respiratory Rate: 18 breaths/min, Blood Pressure: 133/74 mmHg. Respiratory Normal work of breathing on room air. General Notes: 04/15/2022: The wound continues to contract. It is nearly flush with the surrounding skin surface. Periwound skin is in good condition and edema control is excellent. Integumentary (Hair, Skin) Wound #1 status is Open. Original cause of wound was Trauma. The date acquired was: 01/23/2022. The wound has been in treatment 4

## 2022-04-22 ENCOUNTER — Encounter (HOSPITAL_BASED_OUTPATIENT_CLINIC_OR_DEPARTMENT_OTHER): Payer: Medicare Other | Attending: General Surgery | Admitting: General Surgery

## 2022-04-22 ENCOUNTER — Encounter: Payer: Self-pay | Admitting: Neurology

## 2022-04-22 DIAGNOSIS — L97822 Non-pressure chronic ulcer of other part of left lower leg with fat layer exposed: Secondary | ICD-10-CM | POA: Diagnosis present

## 2022-04-22 DIAGNOSIS — I10 Essential (primary) hypertension: Secondary | ICD-10-CM | POA: Insufficient documentation

## 2022-04-22 NOTE — Progress Notes (Signed)
77 y.o. F) Primary Care [name]: Tanda Rockers Other Clinician: Referring Thelton Graca: Treating [name]/Extender: Berniece Salines in Treatment: 5 Vital Signs Height(in): 60 Pulse(bpm): 72 Weight(lbs): 154 Blood Pressure(mmHg): 143/73 Body Mass Index(BMI): 30.1 Temperature(F): 97.8 Respiratory Rate(breaths/min): 18 [1:Photos:] [N/A:N/A] Left, Lateral Lower Leg N/A N/A Wound Location: Trauma N/A N/A Wounding Event: Trauma, Other N/A N/A Primary Etiology: Asthma, Hypertension, Seizure N/A N/A Comorbid History: Disorder 01/23/2022 N/A N/A Date Acquired: 5 N/A N/A Weeks of Treatment: Open N/A N/A Wound Status: No N/A N/A Wound Recurrence: 0.7x0.5x0.1 N/A N/A Measurements L x W x D (cm) 0.275 N/A N/A A (cm) : rea 0.027 N/A N/A Volume (cm) : 94.90% N/A N/A % Reduction in A rea: 98.30% N/A N/A % Reduction in Volume: Full Thickness Without Exposed N/A N/A Classification: Support Structures Medium N/A N/A Exudate A mount: Serosanguineous N/A N/A Exudate Type: red, brown N/A N/A Exudate Color: Distinct, outline attached N/A N/A Wound Margin: Large  (67-100%) N/A N/A Granulation A mount: Red N/A N/A Granulation Quality: Small (1-33%) N/A N/A Necrotic A mount: Eschar, Adherent Slough N/A N/A Necrotic Tissue: Fat Layer (Subcutaneous Tissue): Yes N/A N/A Exposed Structures: Fascia: No Tendon: No Muscle: No Joint: No Bone: No Small (1-33%) N/A N/A Epithelialization: Debridement - Selective/Open Wound N/A N/A Debridement: Pre-procedure Verification/Time Out 10:20 N/A N/A Taken: Lidocaine 4% Topical Solution N/A N/A Pain Control: Necrotic/Eschar, Slough N/A N/A Tissue Debrided: Non-Viable Tissue N/A N/A Level: 0.35 N/A N/A Debridement A (sq cm): rea Curette N/A N/A Instrument: Minimum N/A N/A Bleeding: Pressure N/A N/A Hemostasis A chieved: 0 N/A N/A Procedural Pain: 0 N/A N/A Post Procedural Pain: Procedure was tolerated well N/A N/A Debridement Treatment Response: 0.7x0.5x0.1 N/A N/A Post Debridement Measurements L x W x D (cm) 0.027 N/A N/A Post Debridement Volume: (cm) Allison Gross, Allison Gross ([id number]) [id number] Page 4 of 8 No Abnormalities Noted N/A N/A Periwound Skin Texture: No Abnormalities Noted N/A N/A Periwound Skin Moisture: Erythema: Yes N/A N/A Periwound Skin Color: No Abnormality N/A N/A Temperature: Compression Therapy N/A N/A Procedures Performed: Debridement Treatment Notes Wound #1 (Lower Leg) Wound Laterality: Left, Lateral Cleanser Soap and Water Discharge Instruction: May shower and wash wound with dial antibacterial soap and water prior to dressing change. Peri-Wound Care Sween Lotion (Moisturizing lotion) Discharge Instruction: Apply moisturizing lotion as directed Topical Primary Dressing Hydrofera Blue Ready Transfer Foam, 2.5x2.5 (in/in) Discharge Instruction: Apply directly to wound bed as directed Secondary Dressing ABD Pad, 5x9 Discharge Instruction: Apply over primary dressing as directed. Woven Gauze Sponge, Non-Sterile 4x4 in Discharge  Instruction: Apply over primary dressing as directed. Secured With Compression Wrap ThreePress (3 layer compression wrap) Discharge Instruction: Apply three layer compression as directed. Compression Stockings Add-Ons Electronic Signature(s) Signed: 04/22/2022 11:01:52 AM By: Fredirick Maudlin MD FACS Entered By: Fredirick Maudlin on 04/22/2022 11:01:52 -------------------------------------------------------------------------------- Multi-Disciplinary Care Plan Details Patient Name: Date of Service: Allison Gross 04/22/2022 10:15 A M Medical Record Number: [id number] Patient Account Number: [id number] Date of Birth/Sex: Treating RN: 1946/03/20 (77 y.o. America Brown Primary Care Gaither Biehn: Tanda Rockers Other Clinician: Referring Isreal Moline: Treating Hattie Pine/Extender: Berniece Salines in Treatment: 5 Active Inactive Orientation to the Wound Care Program Nursing Diagnoses: Knowledge deficit related to the wound healing center program Goals: Patient/caregiver will verbalize understanding of the [facility] Date Initiated: 03/16/2022 Target Resolution Date: 07/18/2022 Goal Status: Active Allison Gross, Allison Gross ([id number]) [id number] Page 5 of 8 Interventions: Provide education on orientation to the wound center Notes: Wound/Skin Impairment  77 y.o. F) Primary Care [name]: Tanda Rockers Other Clinician: Referring Thelton Graca: Treating [name]/Extender: Berniece Salines in Treatment: 5 Vital Signs Height(in): 60 Pulse(bpm): 72 Weight(lbs): 154 Blood Pressure(mmHg): 143/73 Body Mass Index(BMI): 30.1 Temperature(F): 97.8 Respiratory Rate(breaths/min): 18 [1:Photos:] [N/A:N/A] Left, Lateral Lower Leg N/A N/A Wound Location: Trauma N/A N/A Wounding Event: Trauma, Other N/A N/A Primary Etiology: Asthma, Hypertension, Seizure N/A N/A Comorbid History: Disorder 01/23/2022 N/A N/A Date Acquired: 5 N/A N/A Weeks of Treatment: Open N/A N/A Wound Status: No N/A N/A Wound Recurrence: 0.7x0.5x0.1 N/A N/A Measurements L x W x D (cm) 0.275 N/A N/A A (cm) : rea 0.027 N/A N/A Volume (cm) : 94.90% N/A N/A % Reduction in A rea: 98.30% N/A N/A % Reduction in Volume: Full Thickness Without Exposed N/A N/A Classification: Support Structures Medium N/A N/A Exudate A mount: Serosanguineous N/A N/A Exudate Type: red, brown N/A N/A Exudate Color: Distinct, outline attached N/A N/A Wound Margin: Large  (67-100%) N/A N/A Granulation A mount: Red N/A N/A Granulation Quality: Small (1-33%) N/A N/A Necrotic A mount: Eschar, Adherent Slough N/A N/A Necrotic Tissue: Fat Layer (Subcutaneous Tissue): Yes N/A N/A Exposed Structures: Fascia: No Tendon: No Muscle: No Joint: No Bone: No Small (1-33%) N/A N/A Epithelialization: Debridement - Selective/Open Wound N/A N/A Debridement: Pre-procedure Verification/Time Out 10:20 N/A N/A Taken: Lidocaine 4% Topical Solution N/A N/A Pain Control: Necrotic/Eschar, Slough N/A N/A Tissue Debrided: Non-Viable Tissue N/A N/A Level: 0.35 N/A N/A Debridement A (sq cm): rea Curette N/A N/A Instrument: Minimum N/A N/A Bleeding: Pressure N/A N/A Hemostasis A chieved: 0 N/A N/A Procedural Pain: 0 N/A N/A Post Procedural Pain: Procedure was tolerated well N/A N/A Debridement Treatment Response: 0.7x0.5x0.1 N/A N/A Post Debridement Measurements L x W x D (cm) 0.027 N/A N/A Post Debridement Volume: (cm) Allison Gross, Allison Gross ([id number]) [id number] Page 4 of 8 No Abnormalities Noted N/A N/A Periwound Skin Texture: No Abnormalities Noted N/A N/A Periwound Skin Moisture: Erythema: Yes N/A N/A Periwound Skin Color: No Abnormality N/A N/A Temperature: Compression Therapy N/A N/A Procedures Performed: Debridement Treatment Notes Wound #1 (Lower Leg) Wound Laterality: Left, Lateral Cleanser Soap and Water Discharge Instruction: May shower and wash wound with dial antibacterial soap and water prior to dressing change. Peri-Wound Care Sween Lotion (Moisturizing lotion) Discharge Instruction: Apply moisturizing lotion as directed Topical Primary Dressing Hydrofera Blue Ready Transfer Foam, 2.5x2.5 (in/in) Discharge Instruction: Apply directly to wound bed as directed Secondary Dressing ABD Pad, 5x9 Discharge Instruction: Apply over primary dressing as directed. Woven Gauze Sponge, Non-Sterile 4x4 in Discharge  Instruction: Apply over primary dressing as directed. Secured With Compression Wrap ThreePress (3 layer compression wrap) Discharge Instruction: Apply three layer compression as directed. Compression Stockings Add-Ons Electronic Signature(s) Signed: 04/22/2022 11:01:52 AM By: Fredirick Maudlin MD FACS Entered By: Fredirick Maudlin on 04/22/2022 11:01:52 -------------------------------------------------------------------------------- Multi-Disciplinary Care Plan Details Patient Name: Date of Service: Allison Gross 04/22/2022 10:15 A M Medical Record Number: [id number] Patient Account Number: [id number] Date of Birth/Sex: Treating RN: 1946/03/20 (77 y.o. America Brown Primary Care Gaither Biehn: Tanda Rockers Other Clinician: Referring Isreal Moline: Treating Hattie Pine/Extender: Berniece Salines in Treatment: 5 Active Inactive Orientation to the Wound Care Program Nursing Diagnoses: Knowledge deficit related to the wound healing center program Goals: Patient/caregiver will verbalize understanding of the [facility] Date Initiated: 03/16/2022 Target Resolution Date: 07/18/2022 Goal Status: Active Allison Gross, Allison Gross ([id number]) [id number] Page 5 of 8 Interventions: Provide education on orientation to the wound center Notes: Wound/Skin Impairment  Nursing Diagnoses: Impaired tissue integrity Knowledge deficit related to ulceration/compromised skin integrity Goals: Patient/caregiver will verbalize understanding of skin care regimen Date Initiated: 03/16/2022 Target Resolution Date: 07/18/2022 Goal Status: Active Ulcer/skin breakdown will have a volume reduction of 30% by week 4 Date Initiated: 03/16/2022 Target Resolution Date: 07/18/2022 Goal Status: Active Interventions: Assess patient/caregiver ability to obtain necessary supplies Assess patient/caregiver ability to perform ulcer/skin care regimen upon admission and  as needed Assess ulceration(s) every visit Provide education on ulcer and skin care Notes: Electronic Signature(s) Signed: 04/22/2022 12:05:17 PM By: Dellie Catholic RN Entered By: Dellie Catholic on 04/22/2022 10:50:16 -------------------------------------------------------------------------------- Pain Assessment Details Patient Name: Date of Service: Allison Gross 04/22/2022 10:15 A M Medical Record Number: [id number] Patient Account Number: [id number] Date of Birth/Sex: Treating RN: Dec 18, 1945 (77 y.o. America Brown Primary Care Duel Conrad: Tanda Rockers Other Clinician: Referring Kaio Kuhlman: Treating Yuriko Portales/Extender: Berniece Salines in Treatment: 5 Active Problems Location of Pain Severity and Description of Pain Patient Has Paino No Site Locations Pain Management and Medication Current Pain ManagementRYLEA, Allison Gross ([id number]) [id number] Page 6 of 8 Electronic Signature(s) Signed: 04/22/2022 12:05:17 PM By: Dellie Catholic RN Entered By: Dellie Catholic on 04/22/2022 10:15:15 -------------------------------------------------------------------------------- Patient/Caregiver Education Details Patient Name: Date of Service: Allison Gross 1/4/2024andnbsp10:15 A M Medical Record Number: [id number] Patient Account Number: [id number] Date of Birth/Gender: Treating RN: May 31, 1945 (77 y.o. America Brown Primary Care Physician: Tanda Rockers Other Clinician: Referring Physician: Treating Physician/Extender: Berniece Salines in Treatment: 5 Education Assessment Education Provided To: Patient Education Topics Provided Wound/Skin Impairment: Methods: Explain/Verbal Responses: Return demonstration correctly Electronic Signature(s) Signed: 04/22/2022 12:05:17 PM By: Dellie Catholic RN Entered By: Dellie Catholic on 04/22/2022  10:50:40 -------------------------------------------------------------------------------- Wound Assessment Details Patient Name: Date of Service: Allison Gross 04/22/2022 10:15 A M Medical Record Number: [id number] Patient Account Number: [id number] Date of Birth/Sex: Treating RN: January 06, 1946 (77 y.o. America Brown Primary Care Kataleena Holsapple: Tanda Rockers Other Clinician: Referring Taziah Difatta: Treating Cornelis Kluver/Extender: Berniece Salines in Treatment: 5 Wound Status Wound Number: 1 Primary Etiology: Trauma, Other Wound Location: Left, Lateral Lower Leg Wound Status: Open Wounding Event: Trauma Comorbid History: Asthma, Hypertension, Seizure Disorder Date Acquired: 01/23/2022 Weeks Of Treatment: 5 Clustered Wound: No Photos Allison Gross, Allison Gross ([id number]) [id number] Page 7 of 8 Wound Measurements Length: (cm) 0.7 Width: (cm) 0.5 Depth: (cm) 0.1 Area: (cm) 0.275 Volume: (cm) 0.027 % Reduction in Area: 94.9% % Reduction in Volume: 98.3% Epithelialization: Small (1-33%) Tunneling: No Undermining: No Wound Description Classification: Full Thickness Without Exposed Support Structures Wound Margin: Distinct, outline attached Exudate Amount: Medium Exudate Type: Serosanguineous Exudate Color: red, brown Foul Odor After Cleansing: No Slough/Fibrino Yes Wound Bed Granulation Amount: Large (67-100%) Exposed Structure Granulation Quality: Red Fascia Exposed: No Necrotic Amount: Small (1-33%) Fat Layer (Subcutaneous Tissue) Exposed: Yes Necrotic Quality: Eschar, Adherent Slough Tendon Exposed: No Muscle Exposed: No Joint Exposed: No Bone Exposed: No Periwound Skin Texture Texture Color No Abnormalities Noted: Yes No Abnormalities Noted: Yes Moisture Temperature / Pain No Abnormalities Noted: Yes Temperature: No Abnormality Treatment Notes Wound #1 (Lower Leg) Wound Laterality: Left, Lateral Cleanser Soap and  Water Discharge Instruction: May shower and wash wound with dial antibacterial soap and water prior to dressing change. Peri-Wound Care Sween Lotion (Moisturizing lotion) Discharge Instruction: Apply moisturizing lotion as directed Topical Primary Dressing Hydrofera Blue Ready Transfer Foam, 2.5x2.5 (in/in) Discharge Instruction: Apply directly to wound bed as directed Secondary Dressing ABD  Nursing Diagnoses: Impaired tissue integrity Knowledge deficit related to ulceration/compromised skin integrity Goals: Patient/caregiver will verbalize understanding of skin care regimen Date Initiated: 03/16/2022 Target Resolution Date: 07/18/2022 Goal Status: Active Ulcer/skin breakdown will have a volume reduction of 30% by week 4 Date Initiated: 03/16/2022 Target Resolution Date: 07/18/2022 Goal Status: Active Interventions: Assess patient/caregiver ability to obtain necessary supplies Assess patient/caregiver ability to perform ulcer/skin care regimen upon admission and  as needed Assess ulceration(s) every visit Provide education on ulcer and skin care Notes: Electronic Signature(s) Signed: 04/22/2022 12:05:17 PM By: Dellie Catholic RN Entered By: Dellie Catholic on 04/22/2022 10:50:16 -------------------------------------------------------------------------------- Pain Assessment Details Patient Name: Date of Service: Allison Gross 04/22/2022 10:15 A M Medical Record Number: [id number] Patient Account Number: [id number] Date of Birth/Sex: Treating RN: Dec 18, 1945 (77 y.o. America Brown Primary Care Duel Conrad: Tanda Rockers Other Clinician: Referring Kaio Kuhlman: Treating Yuriko Portales/Extender: Berniece Salines in Treatment: 5 Active Problems Location of Pain Severity and Description of Pain Patient Has Paino No Site Locations Pain Management and Medication Current Pain ManagementRYLEA, Allison Gross ([id number]) [id number] Page 6 of 8 Electronic Signature(s) Signed: 04/22/2022 12:05:17 PM By: Dellie Catholic RN Entered By: Dellie Catholic on 04/22/2022 10:15:15 -------------------------------------------------------------------------------- Patient/Caregiver Education Details Patient Name: Date of Service: Allison Gross 1/4/2024andnbsp10:15 A M Medical Record Number: [id number] Patient Account Number: [id number] Date of Birth/Gender: Treating RN: May 31, 1945 (77 y.o. America Brown Primary Care Physician: Tanda Rockers Other Clinician: Referring Physician: Treating Physician/Extender: Berniece Salines in Treatment: 5 Education Assessment Education Provided To: Patient Education Topics Provided Wound/Skin Impairment: Methods: Explain/Verbal Responses: Return demonstration correctly Electronic Signature(s) Signed: 04/22/2022 12:05:17 PM By: Dellie Catholic RN Entered By: Dellie Catholic on 04/22/2022  10:50:40 -------------------------------------------------------------------------------- Wound Assessment Details Patient Name: Date of Service: Allison Gross 04/22/2022 10:15 A M Medical Record Number: [id number] Patient Account Number: [id number] Date of Birth/Sex: Treating RN: January 06, 1946 (77 y.o. America Brown Primary Care Kataleena Holsapple: Tanda Rockers Other Clinician: Referring Taziah Difatta: Treating Cornelis Kluver/Extender: Berniece Salines in Treatment: 5 Wound Status Wound Number: 1 Primary Etiology: Trauma, Other Wound Location: Left, Lateral Lower Leg Wound Status: Open Wounding Event: Trauma Comorbid History: Asthma, Hypertension, Seizure Disorder Date Acquired: 01/23/2022 Weeks Of Treatment: 5 Clustered Wound: No Photos Allison Gross, Allison Gross ([id number]) [id number] Page 7 of 8 Wound Measurements Length: (cm) 0.7 Width: (cm) 0.5 Depth: (cm) 0.1 Area: (cm) 0.275 Volume: (cm) 0.027 % Reduction in Area: 94.9% % Reduction in Volume: 98.3% Epithelialization: Small (1-33%) Tunneling: No Undermining: No Wound Description Classification: Full Thickness Without Exposed Support Structures Wound Margin: Distinct, outline attached Exudate Amount: Medium Exudate Type: Serosanguineous Exudate Color: red, brown Foul Odor After Cleansing: No Slough/Fibrino Yes Wound Bed Granulation Amount: Large (67-100%) Exposed Structure Granulation Quality: Red Fascia Exposed: No Necrotic Amount: Small (1-33%) Fat Layer (Subcutaneous Tissue) Exposed: Yes Necrotic Quality: Eschar, Adherent Slough Tendon Exposed: No Muscle Exposed: No Joint Exposed: No Bone Exposed: No Periwound Skin Texture Texture Color No Abnormalities Noted: Yes No Abnormalities Noted: Yes Moisture Temperature / Pain No Abnormalities Noted: Yes Temperature: No Abnormality Treatment Notes Wound #1 (Lower Leg) Wound Laterality: Left, Lateral Cleanser Soap and  Water Discharge Instruction: May shower and wash wound with dial antibacterial soap and water prior to dressing change. Peri-Wound Care Sween Lotion (Moisturizing lotion) Discharge Instruction: Apply moisturizing lotion as directed Topical Primary Dressing Hydrofera Blue Ready Transfer Foam, 2.5x2.5 (in/in) Discharge Instruction: Apply directly to wound bed as directed Secondary Dressing ABD

## 2022-04-22 NOTE — Progress Notes (Signed)
Number: [id number] Date of Birth/Sex: Treating RN: 04/21/1945 (77 y.o. 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 F) Primary Care Provider: Tanda Rockers Other Clinician: Referring Provider: Treating Provider/Extender: Berniece Salines in Treatment: 5 Subjective Chief Complaint Information obtained from  Patient Patient seen for complaints of Non-Healing Wound. History of Present Illness (HPI) ADMISSION 03/16/2022 This is a 77 year old woman who struck her leg on a car door near the beginning of October. She was seen at an urgent care in [place], [place] and given a course of doxycycline. Her symptoms fail to improve and she saw a different urgent care provider who gave a 10-day course of Keflex without improvement. She presented to the emergency department on March 03, 2022 with concern for cellulitis and a wound infection. Due to swelling in the legs, a DVT scan was performed which was negative. She was given an injection of dalbavancin and referred to infectious disease. She saw ID on November 21. Allison Gross ([id number]) [id number] Page 5 of 8 She was prescribed a 10-day course of cefadroxil and referred to the wound care center for further evaluation and management. ABI in clinic today was 1.2. On her left lower leg, there is a wound just lateral to the anterior tibial surface. There is some rubbery slough accumulation. She has 2+ pitting edema. No purulent drainage or malodor. 03/24/2022: The wound is a bit smaller today and less painful. There is some slough on the surface. Edema control is improved. 12/13; the wound is 80% covered in adherent slough. This requires debridement. Using silver alginate and 3 layer. Her edema control is good 04/07/2022: The wound is smaller again this week. Edema control is good. There is a bit of slough on the wound surface, underneath which there is good granulation tissue. 04/15/2022: The wound continues to contract. It is nearly flush with the surrounding skin surface. Periwound skin is in good condition and edema control is excellent. 04/22/2022: The wound is smaller again this week. There is a small amount of slough on the surface and some dry skin around the perimeter. Good edema control. Patient  History Information obtained from Patient. Family History Cancer - Maternal Grandparents,Paternal Grandparents, Diabetes, Heart Disease - Maternal Grandparents, Hypertension, No family history of Seizures, Stroke, Thyroid Problems, Tuberculosis. Social History Never smoker, Marital Status - Married, Alcohol Use - Rarely, Drug Use - No History, Caffeine Use - Daily. Medical History Respiratory Patient has history of Asthma Cardiovascular Patient has history of Hypertension Neurologic Patient has history of Seizure Disorder - petit mal seizures Hospitalization/Surgery History - cataract extraction 2022. - oophorectomy 2005. - cholecystectomy. - hernia repair. - mohs surgery. - hysterectomy. Medical A Surgical History Notes nd Gastrointestinal reflux Objective Constitutional Slightly hypertensive. No acute distress. Vitals Time Taken: 10:13 AM, Height: 60 in, Weight: 154 lbs, BMI: 30.1, Temperature: 97.8 F, Pulse: 72 bpm, Respiratory Rate: 18 breaths/min,  edema control is good 04/07/2022: The wound is smaller again this week. Edema control is good. There is a bit of slough on the wound surface, underneath which there is good granulation tissue. 04/15/2022: The wound continues to contract. It is nearly flush with the surrounding skin surface. Periwound skin is in good condition and edema control is excellent. 04/22/2022: The wound is smaller again this week. There is a small amount of slough on the surface and some dry skin around the perimeter. Good edema control. Electronic Signature(s) Signed: 04/22/2022 11:02:36 AM By: Fredirick Maudlin MD FACS Entered By: Fredirick Maudlin on 04/22/2022 11:02:36 -------------------------------------------------------------------------------- Physical Exam Details Patient Name: Date of Service: Allison Gross 04/22/2022 10:15 A M Medical Record Number: [id number] Patient Account Number: [id number] Date of Birth/Sex: Treating RN: April 09, 1946 (77 y.o. F) Primary Care Provider: Tanda Rockers Other Clinician: Referring Provider: Treating Provider/Extender: Berniece Salines in Treatment: 5 Constitutional Slightly hypertensive. . . . No acute distress. Allison Gross ([id number]) [id number] Page 3 of 8 Respiratory Normal work of breathing on room air. Notes 04/22/2022: The wound is smaller again this week. There is a small amount of slough on the surface and some dry skin around the perimeter. Good edema control. Electronic Signature(s) Signed: 04/22/2022 11:07:53 AM By: Fredirick Maudlin MD  FACS Previous Signature: 04/22/2022 11:02:50 AM Version By: Fredirick Maudlin MD FACS Entered By: Fredirick Maudlin on 04/22/2022 11:07:52 -------------------------------------------------------------------------------- Physician Orders Details Patient Name: Date of Service: Allison Gross 04/22/2022 10:15 A M Medical Record Number: [id number] Patient Account Number: [id number] Date of Birth/Sex: Treating RN: 1946-04-18 (77 y.o. [place] Brown Primary Care Provider: Tanda Rockers Other Clinician: Referring Provider: Treating Provider/Extender: Berniece Salines in Treatment: 5 Verbal / Phone Orders: No Diagnosis Coding ICD-10 Coding Code Description [phone] Non-pressure chronic ulcer of other part of left lower leg with fat layer exposed I10 Essential (primary) hypertension Follow-up Appointments ppointment in 1 week. - Dr. Celine Ahr - Room 2 Return A Anesthetic Wound #1 Left,Lateral Lower Leg (In clinic) Topical Lidocaine 4% applied to wound bed Bathing/ Shower/ Hygiene May shower with protection but do not get wound dressing(s) wet. Protect dressing(s) with water repellant cover (for example, large plastic bag) or a cast cover and may then take shower. Edema Control - Lymphedema / SCD / Other Left Lower Extremity Avoid standing for long periods of time. Wound Treatment Wound #1 - Lower Leg Wound Laterality: Left, Lateral Cleanser: Soap and Water 1 x Per Week/30 Days Discharge Instructions: May shower and wash wound with dial antibacterial soap and water prior to dressing change. Peri-Wound Care: Sween Lotion (Moisturizing lotion) 1 x Per Week/30 Days Discharge Instructions: Apply moisturizing lotion as directed Prim Dressing: Hydrofera Blue Ready Transfer Foam, 2.5x2.5 (in/in) 1 x Per Week/30 Days ary Discharge Instructions: Apply directly to wound bed as directed Secondary Dressing: ABD Pad, 5x9 1 x Per Week/30 Days Discharge Instructions: Apply over  primary dressing as directed. Secondary Dressing: Woven Gauze Sponge, Non-Sterile 4x4 in 1 x Per Week/30 Days Discharge Instructions: Apply over primary dressing as directed. Compression Wrap: ThreePress (3 layer compression wrap) 1 x Per Week/30 Days Discharge Instructions: Apply three layer compression as directed. Allison Gross, Allison Gross ([id number]) [id number] Page 4 of 8 Electronic Signature(s) Signed: 04/22/2022 12:11:14 PM By: Fredirick Maudlin MD FACS Entered By: Fredirick Maudlin on 04/22/2022 11:08:02 -------------------------------------------------------------------------------- Problem List Details Patient Name: Date of Service: Allison Gross 04/22/2022 10:15 A M Medical Record Number: [id number] Patient Account  Apply over primary dressing as directed. Com pression Wrap: ThreePress (3 layer compression wrap) 1 x Per Week/30 Days Discharge Instructions: Apply three layer compression as directed. 04/22/2022: The wound is smaller again this week. There is a small amount of slough on the surface and some dry skin around the perimeter. Good edema control. Is a curette to debride slough and thin dry eschar from the wound. We will continue Hydrofera Blue and 3 layer compression. I anticipate that she will be healed in the next couple of weeks. Follow-up in 1 week's time. Electronic Signature(s) Signed: 04/22/2022 11:08:50 AM By: Fredirick Maudlin MD FACS Entered By: Fredirick Maudlin on 04/22/2022 11:08:50 -------------------------------------------------------------------------------- HxROS Details Patient Name: Date of Service: Allison Gross 04/22/2022 10:15 A M Medical Record Number: [id number] Patient Account Number: [id number] Date  of Birth/Sex: Treating RN: 1945/05/11 (77 y.o. F) Primary Care Provider: Tanda Rockers Other Clinician: Referring Provider: Treating Provider/Extender: Berniece Salines in Treatment: [place], [place] ([id number]) [id number] Page 7 of 8 Information Obtained From Patient Respiratory Medical History: Positive for: Asthma Cardiovascular Medical History: Positive for: Hypertension Gastrointestinal Medical History: Past Medical History Notes: reflux Neurologic Medical History: Positive for: Seizure Disorder - petit mal seizures Immunizations Pneumococcal Vaccine: Received Pneumococcal Vaccination: Yes Received Pneumococcal Vaccination On or After 60th Birthday: Yes Implantable Devices None Hospitalization / Surgery History Type of Hospitalization/Surgery cataract extraction 2022 oophorectomy 2005 cholecystectomy hernia repair mohs surgery hysterectomy Family and Social History Cancer: Yes - Maternal Grandparents,Paternal Grandparents; Diabetes: Yes; Heart Disease: Yes - Maternal Grandparents; Hypertension: Yes; Seizures: No; Stroke: No; Thyroid Problems: No; Tuberculosis: No; Never smoker; Marital Status - Married; Alcohol Use: Rarely; Drug Use: No History; Caffeine Use: Daily; Financial Concerns: No; Food, Clothing or Shelter Needs: No; Support System Lacking: No; Transportation Concerns: No Electronic Signature(s) Signed: 04/22/2022 12:11:14 PM By: Fredirick Maudlin MD FACS Entered By: Fredirick Maudlin on 04/22/2022 11:02:42 -------------------------------------------------------------------------------- SuperBill Details Patient Name: Date of Service: Allison Gross 04/22/2022 Medical Record Number: [id number] Patient Account Number: [id number] Date of Birth/Sex: Treating RN: 04/22/1945 (77 y.o. F) Primary Care Provider: Tanda Rockers Other Clinician: Referring Provider: Treating Provider/Extender: Berniece Salines in Treatment: 5 Diagnosis Coding ICD-10 Codes Code Description [phone] Non-pressure chronic ulcer of other part of left lower leg with fat layer exposed I10 Essential (primary) hypertension Allison Gross, Allison Gross ([id number]) [phone].pdf Page 8 of 8 Facility Procedures : CPT4 Code: 81017510 Description: [phone] - DEBRIDE WOUND 1ST 20 SQ CM OR < ICD-10 Diagnosis Description L97.822 Non-pressure chronic ulcer of other part of left lower leg with fat layer expose Modifier: d Quantity: 1 Physician Procedures : CPT4 Code Description Modifier [phone] [id number] - WC PHYS LEVEL 3 - EST PT 25 ICD-10 Diagnosis Description L97.822 Non-pressure chronic ulcer of other part of left lower leg with fat layer exposed I10 Essential (primary) hypertension Quantity: 1 : [phone] - WC PHYS DEBR WO ANESTH 20 SQ CM ICD-10 Diagnosis Description L97.822 Non-pressure chronic ulcer of other part of left lower leg with fat layer exposed Quantity: 1 Electronic Signature(s) Signed: 04/22/2022 11:09:03 AM By: Fredirick Maudlin MD FACS Entered By: Fredirick Maudlin on 04/22/2022 11:09:02  Allison Gross, Allison Gross ([id number]) [id number] Page 1 of 8 Visit Report for 04/22/2022 Chief Complaint Document Details Patient Name: Date of Service: Allison Gross 04/22/2022 10:15 A M Medical Record Number: [id number] Patient Account Number: [id number] Date of Birth/Sex: Treating RN: [birth year] (77 y.o. F) Primary Care Provider: Tanda Rockers Other Clinician: Referring Provider: Treating Provider/Extender: Berniece Salines in Treatment: 5 Information Obtained from: Patient Chief Complaint Patient seen for complaints of Non-Healing Wound. Electronic Signature(s) Signed: 04/22/2022 11:02:00 AM By: Fredirick Maudlin MD FACS Entered By: Fredirick Maudlin on 04/22/2022 11:02:00 -------------------------------------------------------------------------------- Debridement Details Patient Name: Date of Service: Allison Gross 04/22/2022 10:15 A M Medical Record Number: [id number] Patient Account Number: [id number] Date of Birth/Sex: Treating RN: 1945-08-21 (77 y.o. America Brown Primary Care Provider: Tanda Rockers Other Clinician: Referring Provider: Treating Provider/Extender: Berniece Salines in Treatment: 5 Debridement Performed for Assessment: Wound #1 Left,Lateral Lower Leg Performed By: Physician Fredirick Maudlin, MD Debridement Type: Debridement Level of Consciousness (Pre-procedure): Awake and Alert Pre-procedure Verification/Time Out Yes - 10:20 Taken: Start Time: 10:20 Pain Control: Lidocaine 4% T opical Solution T Area Debrided (L x W): otal 0.7 (cm) x 0.5 (cm) = 0.35 (cm) Tissue and other material debrided: Non-Viable, Eschar, Slough, Slough Level: Non-Viable Tissue Debridement Description: Selective/Open Wound Instrument: Curette Bleeding: Minimum Hemostasis Achieved: Pressure End Time: 10:21 Procedural Pain: 0 Post Procedural Pain: 0 Response to Treatment: Procedure was tolerated  well Level of Consciousness (Post- Awake and Alert procedure): Post Debridement Measurements of Total Wound Length: (cm) 0.7 Width: (cm) 0.5 Depth: (cm) 0.1 Volume: (cm) 0.027 Character of Wound/Ulcer Post Debridement: Improved Post Procedure Diagnosis Allison Gross, Allison Gross ([id number]) [id number] Page 2 of 8 Same as Pre-procedure Notes Scribed for Dr. Celine Ahr by J.Scotton Electronic Signature(s) Signed: 04/22/2022 12:05:17 PM By: Dellie Catholic RN Signed: 04/22/2022 12:11:14 PM By: Fredirick Maudlin MD FACS Entered By: Dellie Catholic on 04/22/2022 10:32:33 -------------------------------------------------------------------------------- HPI Details Patient Name: Date of Service: Allison Gross 04/22/2022 10:15 A M Medical Record Number: [id number] Patient Account Number: [id number] Date of Birth/Sex: Treating RN: 1945/09/17 (77 y.o. F) Primary Care Provider: Tanda Rockers Other Clinician: Referring Provider: Treating Provider/Extender: Berniece Salines in Treatment: 5 History of Present Illness HPI Description: ADMISSION 03/16/2022 This is a 77 year old woman who struck her leg on a car door near the beginning of October. She was seen at an urgent care in [place], [place] and given a course of doxycycline. Her symptoms fail to improve and she saw a different urgent care provider who gave a 10-day course of Keflex without improvement. She presented to the emergency department on March 03, 2022 with concern for cellulitis and a wound infection. Due to swelling in the legs, a DVT scan was performed which was negative. She was given an injection of dalbavancin and referred to infectious disease. She saw ID on November 21. She was prescribed a 10-day course of cefadroxil and referred to the wound care center for further evaluation and management. ABI in clinic today was 1.2. On her left lower leg, there is a wound just lateral to  the anterior tibial surface. There is some rubbery slough accumulation. She has 2+ pitting edema. No purulent drainage or malodor. 03/24/2022: The wound is a bit smaller today and less painful. There is some slough on the surface. Edema control is improved. 12/13; the wound is 80% covered in adherent slough. This requires debridement. Using silver alginate and 3 layer. Her  Apply over primary dressing as directed. Com pression Wrap: ThreePress (3 layer compression wrap) 1 x Per Week/30 Days Discharge Instructions: Apply three layer compression as directed. 04/22/2022: The wound is smaller again this week. There is a small amount of slough on the surface and some dry skin around the perimeter. Good edema control. Is a curette to debride slough and thin dry eschar from the wound. We will continue Hydrofera Blue and 3 layer compression. I anticipate that she will be healed in the next couple of weeks. Follow-up in 1 week's time. Electronic Signature(s) Signed: 04/22/2022 11:08:50 AM By: Fredirick Maudlin MD FACS Entered By: Fredirick Maudlin on 04/22/2022 11:08:50 -------------------------------------------------------------------------------- HxROS Details Patient Name: Date of Service: Allison Gross 04/22/2022 10:15 A M Medical Record Number: [id number] Patient Account Number: [id number] Date  of Birth/Sex: Treating RN: 1945/05/11 (77 y.o. F) Primary Care Provider: Tanda Rockers Other Clinician: Referring Provider: Treating Provider/Extender: Berniece Salines in Treatment: [place], [place] ([id number]) [id number] Page 7 of 8 Information Obtained From Patient Respiratory Medical History: Positive for: Asthma Cardiovascular Medical History: Positive for: Hypertension Gastrointestinal Medical History: Past Medical History Notes: reflux Neurologic Medical History: Positive for: Seizure Disorder - petit mal seizures Immunizations Pneumococcal Vaccine: Received Pneumococcal Vaccination: Yes Received Pneumococcal Vaccination On or After 60th Birthday: Yes Implantable Devices None Hospitalization / Surgery History Type of Hospitalization/Surgery cataract extraction 2022 oophorectomy 2005 cholecystectomy hernia repair mohs surgery hysterectomy Family and Social History Cancer: Yes - Maternal Grandparents,Paternal Grandparents; Diabetes: Yes; Heart Disease: Yes - Maternal Grandparents; Hypertension: Yes; Seizures: No; Stroke: No; Thyroid Problems: No; Tuberculosis: No; Never smoker; Marital Status - Married; Alcohol Use: Rarely; Drug Use: No History; Caffeine Use: Daily; Financial Concerns: No; Food, Clothing or Shelter Needs: No; Support System Lacking: No; Transportation Concerns: No Electronic Signature(s) Signed: 04/22/2022 12:11:14 PM By: Fredirick Maudlin MD FACS Entered By: Fredirick Maudlin on 04/22/2022 11:02:42 -------------------------------------------------------------------------------- SuperBill Details Patient Name: Date of Service: Allison Gross 04/22/2022 Medical Record Number: [id number] Patient Account Number: [id number] Date of Birth/Sex: Treating RN: 04/22/1945 (77 y.o. F) Primary Care Provider: Tanda Rockers Other Clinician: Referring Provider: Treating Provider/Extender: Berniece Salines in Treatment: 5 Diagnosis Coding ICD-10 Codes Code Description [phone] Non-pressure chronic ulcer of other part of left lower leg with fat layer exposed I10 Essential (primary) hypertension Allison Gross, Allison Gross ([id number]) [phone].pdf Page 8 of 8 Facility Procedures : CPT4 Code: 81017510 Description: [phone] - DEBRIDE WOUND 1ST 20 SQ CM OR < ICD-10 Diagnosis Description L97.822 Non-pressure chronic ulcer of other part of left lower leg with fat layer expose Modifier: d Quantity: 1 Physician Procedures : CPT4 Code Description Modifier [id number] - WC PHYS LEVEL 3 - EST PT 25 ICD-10 Diagnosis Description L97.822 Non-pressure chronic ulcer of other part of left lower leg with fat layer exposed I10 Essential (primary) hypertension Quantity: 1 : [phone] [phone] - WC PHYS DEBR WO ANESTH 20 SQ CM ICD-10 Diagnosis Description L97.822 Non-pressure chronic ulcer of other part of left lower leg with fat layer exposed Quantity: 1 Electronic Signature(s) Signed: 04/22/2022 11:09:03 AM By: Fredirick Maudlin MD FACS Entered By: Fredirick Maudlin on 04/22/2022 11:09:02

## 2022-04-29 ENCOUNTER — Encounter (HOSPITAL_BASED_OUTPATIENT_CLINIC_OR_DEPARTMENT_OTHER): Payer: Medicare Other | Admitting: General Surgery

## 2022-04-29 NOTE — Progress Notes (Signed)
AMITY, ROES ([id number]) [id number] Page 1 of 6 Visit Report for 04/29/2022 Chief Complaint Document Details Patient Name: Date of Service: Allison Gross 04/29/2022 2:00 PM Medical Record Number: [id number] Patient Account Number: [id number] Date of Birth/Sex: Treating RN: 1945/08/17 (77 y.o. F) Primary Care Provider: Tanda Rockers Other Clinician: Referring Provider: Treating Provider/Extender: Berniece Salines in Treatment: 6 Information Obtained from: Patient Chief Complaint Patient seen for complaints of Non-Healing Wound. Electronic Signature(s) Signed: 04/29/2022 2:34:45 PM By: Fredirick Maudlin MD FACS Entered By: Fredirick Maudlin on 04/29/2022 14:34:45 -------------------------------------------------------------------------------- HPI Details Patient Name: Date of Service: Allison Gross, Allison Gross 04/29/2022 2:00 PM Medical Record Number: [id number] Patient Account Number: [id number] Date of Birth/Sex: Treating RN: Nov 29, 1945 (77 y.o. F) Primary Care Provider: Tanda Rockers Other Clinician: Referring Provider: Treating Provider/Extender: Berniece Salines in Treatment: 6 History of Present Illness HPI Description: ADMISSION 03/16/2022 This is a 77 year old woman who struck her leg on a car door near the beginning of October. She was seen at an urgent care in [place], [place] and given a course of doxycycline. Her symptoms fail to improve and she saw a different urgent care provider who gave a 10-day course of Keflex without improvement. She presented to the emergency department on March 03, 2022 with concern for cellulitis and a wound infection. Due to swelling in the legs, a DVT scan was performed which was negative. She was given an injection of dalbavancin and referred to infectious disease. She saw ID on November 21. She was prescribed a 10-day course of cefadroxil and referred to the  wound care center for further evaluation and management. ABI in clinic today was 1.2. On her left lower leg, there is a wound just lateral to the anterior tibial surface. There is some rubbery slough accumulation. She has 2+ pitting edema. No purulent drainage or malodor. 03/24/2022: The wound is a bit smaller today and less painful. There is some slough on the surface. Edema control is improved. 12/13; the wound is 80% covered in adherent slough. This requires debridement. Using silver alginate and 3 layer. Her edema control is good 04/07/2022: The wound is smaller again this week. Edema control is good. There is a bit of slough on the wound surface, underneath which there is good granulation tissue. 04/15/2022: The wound continues to contract. It is nearly flush with the surrounding skin surface. Periwound skin is in good condition and edema control is excellent. 04/22/2022: The wound is smaller again this week. There is a small amount of slough on the surface and some dry skin around the perimeter. Good edema control. 04/29/2022: Her wound is healed. Electronic Signature(s) Signed: 04/29/2022 2:35:02 PM By: Fredirick Maudlin MD FACS [place], Alexander Bergeron ([id number]) [id number] Page 2 of 6 Entered By: Fredirick Maudlin on 04/29/2022 14:35:01 -------------------------------------------------------------------------------- Physical Exam Details Patient Name: Date of Service: Allison Gross 04/29/2022 2:00 PM Medical Record Number: [id number] Patient Account Number: [id number] Date of Birth/Sex: Treating RN: 1946/03/25 (77 y.o. F) Primary Care Provider: Tanda Rockers Other Clinician: Referring Provider: Treating Provider/Extender: Berniece Salines in Treatment: 6 Constitutional . . . . no acute distress. Respiratory Normal work of breathing on room air. Notes 04/29/2022: Her wound is healed. Electronic Signature(s) Signed: 04/29/2022 2:38:20  PM By: Fredirick Maudlin MD FACS Entered By: Fredirick Maudlin on 04/29/2022 14:38:20 -------------------------------------------------------------------------------- Physician Orders Details Patient Name: Date of Service: Allison [name] Gross 04/29/2022 2:00 PM Medical Record Number: [id number] Patient Account  AMITY, ROES ([id number]) [id number] Page 1 of 6 Visit Report for 04/29/2022 Chief Complaint Document Details Patient Name: Date of Service: Allison Gross 04/29/2022 2:00 PM Medical Record Number: [id number] Patient Account Number: [id number] Date of Birth/Sex: Treating RN: 1945/08/17 (77 y.o. F) Primary Care Provider: Tanda Rockers Other Clinician: Referring Provider: Treating Provider/Extender: Berniece Salines in Treatment: 6 Information Obtained from: Patient Chief Complaint Patient seen for complaints of Non-Healing Wound. Electronic Signature(s) Signed: 04/29/2022 2:34:45 PM By: Fredirick Maudlin MD FACS Entered By: Fredirick Maudlin on 04/29/2022 14:34:45 -------------------------------------------------------------------------------- HPI Details Patient Name: Date of Service: Allison Gross, Allison Gross 04/29/2022 2:00 PM Medical Record Number: [id number] Patient Account Number: [id number] Date of Birth/Sex: Treating RN: Nov 29, 1945 (77 y.o. F) Primary Care Provider: Tanda Rockers Other Clinician: Referring Provider: Treating Provider/Extender: Berniece Salines in Treatment: 6 History of Present Illness HPI Description: ADMISSION 03/16/2022 This is a 77 year old woman who struck her leg on a car door near the beginning of October. She was seen at an urgent care in [place], [place] and given a course of doxycycline. Her symptoms fail to improve and she saw a different urgent care provider who gave a 10-day course of Keflex without improvement. She presented to the emergency department on March 03, 2022 with concern for cellulitis and a wound infection. Due to swelling in the legs, a DVT scan was performed which was negative. She was given an injection of dalbavancin and referred to infectious disease. She saw ID on November 21. She was prescribed a 10-day course of cefadroxil and referred to the  wound care center for further evaluation and management. ABI in clinic today was 1.2. On her left lower leg, there is a wound just lateral to the anterior tibial surface. There is some rubbery slough accumulation. She has 2+ pitting edema. No purulent drainage or malodor. 03/24/2022: The wound is a bit smaller today and less painful. There is some slough on the surface. Edema control is improved. 12/13; the wound is 80% covered in adherent slough. This requires debridement. Using silver alginate and 3 layer. Her edema control is good 04/07/2022: The wound is smaller again this week. Edema control is good. There is a bit of slough on the wound surface, underneath which there is good granulation tissue. 04/15/2022: The wound continues to contract. It is nearly flush with the surrounding skin surface. Periwound skin is in good condition and edema control is excellent. 04/22/2022: The wound is smaller again this week. There is a small amount of slough on the surface and some dry skin around the perimeter. Good edema control. 04/29/2022: Her wound is healed. Electronic Signature(s) Signed: 04/29/2022 2:35:02 PM By: Fredirick Maudlin MD FACS [place], Alexander Bergeron ([id number]) [id number] Page 2 of 6 Entered By: Fredirick Maudlin on 04/29/2022 14:35:01 -------------------------------------------------------------------------------- Physical Exam Details Patient Name: Date of Service: Allison Gross 04/29/2022 2:00 PM Medical Record Number: [id number] Patient Account Number: [id number] Date of Birth/Sex: Treating RN: 1946/03/25 (77 y.o. F) Primary Care Provider: Tanda Rockers Other Clinician: Referring Provider: Treating Provider/Extender: Berniece Salines in Treatment: 6 Constitutional . . . . no acute distress. Respiratory Normal work of breathing on room air. Notes 04/29/2022: Her wound is healed. Electronic Signature(s) Signed: 04/29/2022 2:38:20  PM By: Fredirick Maudlin MD FACS Entered By: Fredirick Maudlin on 04/29/2022 14:38:20 -------------------------------------------------------------------------------- Physician Orders Details Patient Name: Date of Service: Allison [name] Gross 04/29/2022 2:00 PM Medical Record Number: [id number] Patient Account  surrounding skin surface. Periwound skin is in good condition and edema control is excellent. 04/22/2022: The wound is smaller again this week. There is a small amount of slough on the surface and some dry skin around the perimeter. Good edema control. 04/29/2022: Her wound is healed. Patient History Information obtained from Patient. Family History Cancer - Maternal Grandparents,Paternal Grandparents, Diabetes, Heart Disease - Maternal Grandparents, Hypertension, No family history of Seizures, Stroke, Thyroid Problems, Tuberculosis. Social History Never smoker, Marital Status - Married, Alcohol Use - Rarely, Drug Use - No History, Caffeine Use - Daily. Medical History Respiratory Patient has history of Asthma Cardiovascular Patient has history of Hypertension Neurologic Patient has history of Seizure Disorder - petit mal seizures Hospitalization/Surgery History - cataract extraction 2022. - oophorectomy 2005. - cholecystectomy. - hernia repair. - mohs surgery. - hysterectomy. Medical A Surgical History Notes nd Gastrointestinal reflux Objective Constitutional no acute distress. Vitals Time Taken: 2:05 PM, Height: 60 in, Weight: 154 lbs, BMI: 30.1, Temperature: 98.5 F, Pulse: 82 bpm, Respiratory Rate: 18 breaths/min, Blood Pressure: 133/83 mmHg. Respiratory Normal work of breathing on room air. General Notes: 04/29/2022: Her  wound is healed. Integumentary (Hair, Skin) Wound #1 status is Healed - Epithelialized. Original cause of wound was Trauma. The date acquired was: 01/23/2022. The wound has been in treatment 6 weeks. The wound is located on the Left,Lateral Lower Leg. The wound measures 0cm length x 0cm width x 0cm depth; 0cm^2 area and 0cm^3 volume. There is Fat Layer (Subcutaneous Tissue) exposed. There is no tunneling or undermining noted. There is a none present amount of drainage noted. The wound margin is distinct with the outline attached to the wound base. There is large (67-100%) red granulation within the wound bed. There is a small (1-33%) amount of necrotic tissue within the wound bed including Eschar. The periwound skin appearance had no abnormalities noted for texture. The periwound skin appearance had no abnormalities noted for moisture. The periwound skin appearance had no abnormalities noted for color. Periwound temperature was noted as No Abnormality. Assessment Active Problems ICD-10 Non-pressure chronic ulcer of other part of left lower leg with fat layer exposed Essential (primary) hypertension Plan Allison Gross, Allison Gross ([id number]) [id number] Page 5 of 6 Discharge From [facility] Services: Discharge from [facility]!! Bathing/ Shower/ Hygiene: May shower with protection but do not get wound dressing(s) wet. Protect dressing(s) with water repellant cover (for example, large plastic bag) or a cast cover and may then take shower. Edema Control - Lymphedema / SCD / Other: Avoid standing for long periods of time. 04/29/2022: Her wound is healed. I have recommended that she wear compression stockings (20 to 30 mmHg) on a daily basis. She is somewhat resistant to this but we did provide her with the contact information for elastic therapy in [place]. She should keep her legs elevated is much as possible throughout the day and at night while she is  sleeping. She should use a good moisturizer on her legs. We will discharge her from the wound care center. She may follow-up as needed. Electronic Signature(s) Signed: 04/29/2022 2:39:42 PM By: Fredirick Maudlin MD FACS Entered By: Fredirick Maudlin on 04/29/2022 14:39:42 -------------------------------------------------------------------------------- HxROS Details Patient Name: Date of Service: Allison Gross, Allison Gross 04/29/2022 2:00 PM Medical Record Number: [id number] Patient Account Number: [id number] Date of Birth/Sex: Treating RN: [birth year] (77 y.o. F) Primary Care Provider: Tanda Rockers Other Clinician: Referring Provider: Treating Provider/Extender: Berniece Salines in Treatment: 6 Information Obtained From Patient Respiratory  AMITY, ROES ([id number]) [id number] Page 1 of 6 Visit Report for 04/29/2022 Chief Complaint Document Details Patient Name: Date of Service: Allison Gross 04/29/2022 2:00 PM Medical Record Number: [id number] Patient Account Number: [id number] Date of Birth/Sex: Treating RN: 1945/08/17 (77 y.o. F) Primary Care Provider: Tanda Rockers Other Clinician: Referring Provider: Treating Provider/Extender: Berniece Salines in Treatment: 6 Information Obtained from: Patient Chief Complaint Patient seen for complaints of Non-Healing Wound. Electronic Signature(s) Signed: 04/29/2022 2:34:45 PM By: Fredirick Maudlin MD FACS Entered By: Fredirick Maudlin on 04/29/2022 14:34:45 -------------------------------------------------------------------------------- HPI Details Patient Name: Date of Service: Allison Gross, Allison Gross 04/29/2022 2:00 PM Medical Record Number: [id number] Patient Account Number: [id number] Date of Birth/Sex: Treating RN: Nov 29, 1945 (77 y.o. F) Primary Care Provider: Tanda Rockers Other Clinician: Referring Provider: Treating Provider/Extender: Berniece Salines in Treatment: 6 History of Present Illness HPI Description: ADMISSION 03/16/2022 This is a 77 year old woman who struck her leg on a car door near the beginning of October. She was seen at an urgent care in [place], [place] and given a course of doxycycline. Her symptoms fail to improve and she saw a different urgent care provider who gave a 10-day course of Keflex without improvement. She presented to the emergency department on March 03, 2022 with concern for cellulitis and a wound infection. Due to swelling in the legs, a DVT scan was performed which was negative. She was given an injection of dalbavancin and referred to infectious disease. She saw ID on November 21. She was prescribed a 10-day course of cefadroxil and referred to the  wound care center for further evaluation and management. ABI in clinic today was 1.2. On her left lower leg, there is a wound just lateral to the anterior tibial surface. There is some rubbery slough accumulation. She has 2+ pitting edema. No purulent drainage or malodor. 03/24/2022: The wound is a bit smaller today and less painful. There is some slough on the surface. Edema control is improved. 12/13; the wound is 80% covered in adherent slough. This requires debridement. Using silver alginate and 3 layer. Her edema control is good 04/07/2022: The wound is smaller again this week. Edema control is good. There is a bit of slough on the wound surface, underneath which there is good granulation tissue. 04/15/2022: The wound continues to contract. It is nearly flush with the surrounding skin surface. Periwound skin is in good condition and edema control is excellent. 04/22/2022: The wound is smaller again this week. There is a small amount of slough on the surface and some dry skin around the perimeter. Good edema control. 04/29/2022: Her wound is healed. Electronic Signature(s) Signed: 04/29/2022 2:35:02 PM By: Fredirick Maudlin MD FACS [place], Alexander Bergeron ([id number]) [id number] Page 2 of 6 Entered By: Fredirick Maudlin on 04/29/2022 14:35:01 -------------------------------------------------------------------------------- Physical Exam Details Patient Name: Date of Service: Allison Gross 04/29/2022 2:00 PM Medical Record Number: [id number] Patient Account Number: [id number] Date of Birth/Sex: Treating RN: 1946/03/25 (77 y.o. F) Primary Care Provider: Tanda Rockers Other Clinician: Referring Provider: Treating Provider/Extender: Berniece Salines in Treatment: 6 Constitutional . . . . no acute distress. Respiratory Normal work of breathing on room air. Notes 04/29/2022: Her wound is healed. Electronic Signature(s) Signed: 04/29/2022 2:38:20  PM By: Fredirick Maudlin MD FACS Entered By: Fredirick Maudlin on 04/29/2022 14:38:20 -------------------------------------------------------------------------------- Physician Orders Details Patient Name: Date of Service: Allison [name] Gross 04/29/2022 2:00 PM Medical Record Number: [id number] Patient Account

## 2022-04-30 ENCOUNTER — Telehealth: Payer: Self-pay | Admitting: Internal Medicine

## 2022-04-30 NOTE — Telephone Encounter (Signed)
ATC X1 LVM for patient to call us back

## 2022-04-30 NOTE — Telephone Encounter (Signed)
PT said Symbicort replacement still "astronomical" can we do another that is less cost but just as effective? Pls call to advise @ [phone] TY

## 2022-04-30 NOTE — Telephone Encounter (Signed)
Pt returned call. Went over the message about her [facility] inhaler and she said that was correct that the cost for the  [facility] inhaler was still to expensive for her and she wants to know if there was something that would be cheaper.   Routing to prior auth team to see which inhalers are preferred with pt's insurance.

## 2022-05-01 NOTE — Progress Notes (Signed)
ELY, SPRAGG ([id number]) [id number] Page 1 of 6 Visit Report for 04/29/2022 Arrival Information Details Patient Name: Date of Service: Allison Gross DINE 04/29/2022 2:00 PM Medical Record Number: [id number] Patient Account Number: [id number] Date of Birth/Sex: Treating RN: 07-Feb-1946 (77 y.o. America Brown Primary Care [place] Sloop: Tanda Rockers Other Clinician: Referring Yaretzy Olazabal: Treating Jaskiran Pata/Extender: Berniece Salines in Treatment: 6 Visit Information History Since Last Visit Added or deleted any medications: No Patient Arrived: Ambulatory Any new allergies or adverse reactions: No Arrival Time: 14:00 Had a fall or experienced change in No Accompanied By: spouse activities of daily living that may affect Transfer Assistance: None risk of falls: Patient Requires Transmission-Based Precautions: No Signs or symptoms of abuse/neglect since last visito No Patient Has Alerts: No Hospitalized since last visit: No Implantable device outside of the clinic excluding No cellular tissue based products placed in the center since last visit: Has Dressing in Place as Prescribed: Yes Has Compression in Place as Prescribed: Yes Pain Present Now: Yes Electronic Signature(s) Signed: 04/29/2022 3:40:41 PM By: Dellie Catholic RN Entered By: Dellie Catholic on 04/29/2022 14:10:11 -------------------------------------------------------------------------------- Encounter Discharge Information Details Patient Name: Date of Service: Allison Gross DINE 04/29/2022 2:00 PM Medical Record Number: [id number] Patient Account Number: [id number] Date of Birth/Sex: Treating RN: 1945-08-28 (77 y.o. America Brown Primary Care Jeanie Mccard: Tanda Rockers Other Clinician: Referring Fey Coghill: Treating Krishika Bugge/Extender: Berniece Salines in Treatment: 6 Encounter Discharge Information Items Discharge Condition:  Stable Ambulatory Status: Ambulatory Discharge Destination: Home Transportation: Private Auto Accompanied By: spouse Schedule Follow-up Appointment: Yes Clinical Summary of Care: Patient Declined Electronic Signature(s) Signed: 04/29/2022 3:40:41 PM By: Dellie Catholic RN Entered By: Dellie Catholic on 04/29/2022 15:39:40 Seel, Alexander Bergeron ([id number]) [id number] Page 2 of 6 -------------------------------------------------------------------------------- Lower Extremity Assessment Details Patient Name: Date of Service: Allison Gross DINE 04/29/2022 2:00 PM Medical Record Number: [id number] Patient Account Number: [id number] Date of Birth/Sex: Treating RN: 05/03/1945 (77 y.o. America Brown Primary Care Evie Crumpler: Tanda Rockers Other Clinician: Referring Jay Kempe: Treating Samik Balkcom/Extender: Berniece Salines in Treatment: 6 Edema Assessment Assessed: Shirlyn Goltz: No] [Right: No] Edema: [Left: Ye] [Right: s] Calf Left: Right: Point of Measurement: 33 cm From Medial Instep 31 cm 32 cm Ankle Left: Right: Point of Measurement: 10 cm From Medial Instep 18 cm 19 cm Knee To Floor Left: Right: From Medial Instep 38 cm 38 cm Vascular Assessment Pulses: Dorsalis Pedis Palpable: [Left:Yes] Electronic Signature(s) Signed: 04/29/2022 3:40:41 PM By: Dellie Catholic RN Entered By: Dellie Catholic on 04/29/2022 14:18:03 -------------------------------------------------------------------------------- Multi Wound Chart Details Patient Name: Date of Service: Allison Gross DINE 04/29/2022 2:00 PM Medical Record Number: [id number] Patient Account Number: [id number] Date of Birth/Sex: Treating RN: [birth year] (77 y.o. F) Primary Care Shantee Hayne: Tanda Rockers Other Clinician: Referring Khale Nigh: Treating Wilma Wuthrich/Extender: Berniece Salines in Treatment: 6 Vital Signs Height(in): 60 Pulse(bpm): 36 Weight(lbs): 154 Blood  Pressure(mmHg): 133/83 Body Mass Index(BMI): 30.1 Temperature(F): 98.5 Respiratory Rate(breaths/min): 18 [1:Photos:] [N/A:N/A] Left, Lateral Lower Leg N/A N/A Wound Location: Trauma N/A N/A Wounding Event: Trauma, Other N/A N/A Primary Etiology: Asthma, Hypertension, Seizure N/A N/A Comorbid History: Disorder 01/23/2022 N/A N/A Date Acquired: 6 N/A N/A Weeks of Treatment: Healed - Epithelialized N/A N/A Wound Status: No N/A N/A Wound Recurrence: 0x0x0 N/A N/A Measurements L x W x D (cm) 0 N/A N/A A (cm) : rea 0 N/A N/A Volume (cm) : 100.00% N/A N/A % Reduction in Area: 100.00% N/A  Acquired: 01/23/2022 Weeks Of Treatment: 6 Clustered Wound: No Photos Wound Measurements Length: (cm) Width: (cm) Depth: (cm) Area: (cm) Volume: (cm) 0 % Reduction in Area: 100% 0 % Reduction in Volume: 100% 0 Epithelialization: Small (1-33%) 0 Tunneling: No 0 Undermining: No Wound Description Classification: Full Thickness Without Exposed Support Structures Wound Margin: Distinct, outline attached Exudate Amount: None Present Foul Odor After Cleansing: No Slough/Fibrino Yes Wound  Bed Mccartt, Belladonna ([id number]) [id number] Page 6 of 6 Granulation Amount: Large (67-100%) Exposed Structure Granulation Quality: Red Fascia Exposed: No Necrotic Amount: Small (1-33%) Fat Layer (Subcutaneous Tissue) Exposed: Yes Necrotic Quality: Eschar Tendon Exposed: No Muscle Exposed: No Joint Exposed: No Bone Exposed: No Periwound Skin Texture Texture Color No Abnormalities Noted: Yes No Abnormalities Noted: Yes Moisture Temperature / Pain No Abnormalities Noted: Yes Temperature: No Abnormality Treatment Notes Wound #1 (Lower Leg) Wound Laterality: Left, Lateral Cleanser Peri-Wound Care Topical Primary Dressing Secondary Dressing Secured With Compression Wrap Compression Stockings Add-Ons Electronic Signature(s) Signed: 04/30/2022 4:39:48 PM By: Blanche East RN Entered By: Blanche East on 04/29/2022 14:26:21 -------------------------------------------------------------------------------- Vitals Details Patient Name: Date of Service: Allison Gross DINE 04/29/2022 2:00 PM Medical Record Number: [id number] Patient Account Number: [id number] Date of Birth/Sex: Treating RN: [birth year] (77 y.o. America Brown Primary Care Zamya Culhane: Tanda Rockers Other Clinician: Referring Mareesa Gathright: Treating Sylvan Sookdeo/Extender: Berniece Salines in Treatment: 6 Vital Signs Time Taken: 14:05 Temperature (F): 98.5 Height (in): 60 Pulse (bpm): 82 Weight (lbs): 154 Respiratory Rate (breaths/min): 18 Body Mass Index (BMI): 30.1 Blood Pressure (mmHg): 133/83 Reference Range: 80 - 120 mg / dl Electronic Signature(s) Signed: 04/29/2022 3:40:41 PM By: Dellie Catholic RN Entered By: Dellie Catholic on 04/29/2022 14:10:46  N/A % Reduction in Volume: Full Thickness Without Exposed N/A N/A Classification: Support Structures None Present N/A N/A Exudate A mount: Distinct, outline attached N/A N/A Wound Margin: Large (67-100%) N/A N/A Granulation Amount: Red N/A N/A Granulation Quality: Small (1-33%) N/A N/A Necrotic Amount: Eschar N/A N/A Necrotic Tissue: Fat Layer (Subcutaneous Tissue): Yes N/A N/A Exposed Structures: Fascia: No Tendon: No Muscle: No Joint: No Bone: No Small (1-33%) N/A N/A Epithelialization: No Abnormalities Noted N/A N/A Periwound Skin Texture: No Abnormalities Noted N/A N/A Periwound Skin Moisture: Erythema: Yes N/A N/A Periwound Skin Color: No Abnormality N/A N/A Temperature: Treatment Notes Electronic Signature(s) Signed: 04/29/2022 2:34:38 PM By: Fredirick Maudlin MD FACS Entered By: Fredirick Maudlin on 04/29/2022 14:34:38 -------------------------------------------------------------------------------- Multi-Disciplinary Care Plan Details Patient Name: Date of Service: Allison Gross DINE 04/29/2022 2:00 PM Medical Record Number: [id number] Patient Account Number: [id number] Date of Birth/Sex: Treating RN: January 07, 1946 (77 y.o. Iver Nestle, Jamie Primary Care Aneri Slagel: Tanda Rockers Other Clinician: Referring Tikita Mabee: Treating Areal Cochrane/Extender: Berniece Salines in Treatment: 6 Active Inactive Electronic Signature(s) Signed: 04/30/2022 4:39:48 PM By: Blanche East RN Entered By: Blanche East on 04/29/2022 14:27:39 Melony Overly ([id number]) [id number] Page 4 of 6 -------------------------------------------------------------------------------- Pain Assessment Details Patient Name: Date of Service: Allison Gross DINE 04/29/2022 2:00 PM Medical Record Number: [id number] Patient Account Number: [id number] Date of Birth/Sex: Treating RN: 14-Aug-1945 (77 y.o. America Brown Primary Care Kamyah Wilhelmsen: Tanda Rockers Other Clinician: Referring Annaliese Saez: Treating Bart Ashford/Extender: Berniece Salines in Treatment: 6 Active Problems Location of Pain Severity and Description of Pain Patient Has Paino Yes Site Locations Pain Location: Pain in Ulcers With Dressing Change: No Duration of the Pain. Constant / Intermittento Constant Rate the pain. Current Pain Level: 1 Worst Pain Level: 10 Least Pain Level: 1 Tolerable Pain Level: 3 Character of Pain Describe the Pain: Difficult to Pinpoint Pain Management and Medication Current Pain Management: Medication: Yes Cold Application: No Rest: Yes Massage: No Activity: No T.E.N.S.: No Heat Application: No Leg drop or elevation: No Is the Current Pain Management Adequate: Adequate How does your wound impact your activities of daily livingo Sleep: No Bathing: No Appetite: No Relationship With Others: No Bladder Continence: No Emotions: No Bowel Continence: No Work: No Toileting: No Drive: No Dressing: No Hobbies: No Electronic Signature(s) Signed: 04/29/2022 3:40:41 PM By: Dellie Catholic RN Entered By: Dellie Catholic on 04/29/2022 14:11:21 -------------------------------------------------------------------------------- Patient/Caregiver Education Details Patient Name: Date of Service: Allison Gross DINE  1/11/2024andnbsp2:00 PM Medical Record Number: [id number] Patient Account Number: [id number] Date of Birth/Gender: Treating RN: 1945-08-13 (77 y.o. Marta Lamas [place], [place] ([id number]) [id number] Page 5 of 6 Primary Care Physician: Tanda Rockers Other Clinician: Referring Physician: Treating Physician/Extender: Berniece Salines in Treatment: 6 Education Assessment Education Provided To: Patient Education Topics Provided Wound/Skin Impairment: Methods: Explain/Verbal Responses: Reinforcements needed, State content correctly Electronic Signature(s) Signed: 04/30/2022 4:39:48 PM By: Blanche East RN Entered By: Blanche East on 04/29/2022 14:30:13 -------------------------------------------------------------------------------- Wound Assessment Details Patient Name: Date of Service: Allison Gross DINE 04/29/2022 2:00 PM Medical Record Number: [id number] Patient Account Number: [id number] Date of Birth/Sex: Treating RN: 1945-10-07 (77 y.o. Marta Lamas Primary Care Sydell Prowell: Tanda Rockers Other Clinician: Referring Sabian Kuba: Treating Aundra Espin/Extender: Berniece Salines in Treatment: 6 Wound Status Wound Number: 1 Primary Etiology: Trauma, Other Wound Location: Left, Lateral Lower Leg Wound Status: Healed - Epithelialized Wounding Event: Trauma Comorbid History: Asthma, Hypertension, Seizure Disorder Date

## 2022-05-04 ENCOUNTER — Other Ambulatory Visit (HOSPITAL_COMMUNITY): Payer: Self-pay

## 2022-05-04 NOTE — Telephone Encounter (Signed)
Not able to complete benefits investigation due to fill of (I'm assuming Dulera) on 01/12ish still showing under insurance. Patient may also have deductible to meet for the new year that is contributing to any high-cost copays.

## 2022-05-04 NOTE — Telephone Encounter (Signed)
Patient states Symbicort is covered by insurance. Would like called into pharmacy. Pharmacy is [facility]. Patient phone number is [phone].

## 2022-05-04 NOTE — Telephone Encounter (Signed)
Symbicort 160 2bid

## 2022-05-05 ENCOUNTER — Other Ambulatory Visit: Payer: Self-pay

## 2022-05-05 MED ORDER — BUDESONIDE-FORMOTEROL FUMARATE 160-4.5 MCG/ACT IN AERO: 2.0000 | INHALATION_SPRAY | Freq: Two times a day (BID) | RESPIRATORY_TRACT | 6 refills | Status: DC

## 2022-05-05 NOTE — Telephone Encounter (Signed)
Spoke with patient. I advised of Symbicort refill. Nothing further needed.

## 2022-05-06 ENCOUNTER — Ambulatory Visit (HOSPITAL_BASED_OUTPATIENT_CLINIC_OR_DEPARTMENT_OTHER): Payer: Medicare Other | Admitting: General Surgery

## 2022-05-06 ENCOUNTER — Encounter: Payer: Self-pay | Admitting: Neurology

## 2022-05-06 ENCOUNTER — Ambulatory Visit (INDEPENDENT_AMBULATORY_CARE_PROVIDER_SITE_OTHER): Payer: Medicare Other | Admitting: Neurology

## 2022-05-06 VITALS — BP 146/83 | HR 68 | Ht 60.0 in | Wt 153.8 lb

## 2022-05-06 DIAGNOSIS — G40009 Localization-related (focal) (partial) idiopathic epilepsy and epileptic syndromes with seizures of localized onset, not intractable, without status epilepticus: Secondary | ICD-10-CM

## 2022-05-06 DIAGNOSIS — D333 Benign neoplasm of cranial nerves: Secondary | ICD-10-CM | POA: Diagnosis not present

## 2022-05-06 MED ORDER — CARBAMAZEPINE ER 200 MG PO CP12: 200.0000 mg | ORAL_CAPSULE | Freq: Two times a day (BID) | ORAL | 3 refills | Status: DC

## 2022-05-06 NOTE — Progress Notes (Signed)
NEUROLOGY FOLLOW UP OFFICE NOTE  Allison Gross [id number] [birth year]  HISTORY OF PRESENT ILLNESS: I had the pleasure of seeing Allison Gross in follow-up in the neurology clinic on 05/06/2022.  The patient was last seen a year ago. She has a history of well-controlled seizures on carbamazepine ER 200mg  BID, seizure-free since 2000. She presented in 2021 after 2 transient episodes of left hand weakness and heaviness. MRI brain with and without contrast no acute changes. TIA workeup unremarkable, we had discussed starting aspirin but she reports speaking to her cardiologist who she states did not think she needed aspirin. No further left hand symptoms since 2021. She denies any staring/unresponsive episodes, gaps in time, olfactory/gustatory hallucinations, focal numbness/tingling/weakness, myoclonic jerks. No headaches, dizziness, tinnitus, hearing loss, no falls. 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    History on Initial Assessment 12/31/2019: This is a pleasant 77 year old right-handed woman with a history of hypertension, complex partial seizures, presenting for second opinion. She had been seeing neurologist Dr. Maryln Gottron since at least 2013, records were reviewed. She was last seen in January 2021 with note of well-controlled seizures since on carbamazepine ER 200mg  BID. Her last carbamazepine level in January 2021 was 8.5. She reports having brief passing out episodes at age 77. She had preeclampsia with her first child in 70 and had post-partum convulsions. 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 It was not noticeable to others, she just held on to the table. She had 2 or  3 of them and was seen by Neurology and told there was a lesion on the left side. She was started on carbamazepine. She reports the last seizure was in 2000 and at that time dose was increased to carbamazepine ER 200mg  BID. She denies any side effects. Her last bone density scan done 8 years ago showed osteopenia, she was briefly on Fosamax, which was stopped by another provider. She denies any staring/unresponsive episodes, gaps in time, olfactory/gustatory hallucinations, myoclonic jerks. She denies any headaches, dizziness, diplopia, dysarthria/dysphagia, neck/back pain, bowel/bladder dysfunction. Her last fall was 2 months ago.   After she was referred and scheduled for this visit, she has also had 2 recent episodes that occurred a week apart at the end of August. The first episode occurred while she was holding the TV remote control with her left hand and she had weakness in her left hand and a heaviness in her left arm. It felt like it went out for a minute, no numbness/tingling/pain, then she felt fine. No associated headache, confusion. The second episode was at the end of August, she was typing on her computer then had the same sensation of weakness/heaviness in her left arm lasting 45-60 seconds. She also had one episode at night where there was a burning/itchiness on her left hand radiating to her fingers that lasted for 10 minutes. Face and leg were not affected. She reports having normal cholesterol levels when checked by her cardiologist, LDL in the 30s. Last bloodwork was over a year ago. She reports having an echocardiogram 4-6 weeks ago, reportedly normal.   Epilepsy Risk Factors:  She had a normal birth and early development.  There is no  history of febrile convulsions, CNS infections such as meningitis/encephalitis, significant traumatic brain injury, neurosurgical procedures, or family history of seizures.   PAST MEDICAL HISTORY: Past Medical History:  Diagnosis Date   Asthma    Cancer  (HCC)    basil,squamous   Hypertension    Osteopenia    Seizures (HCC) 15 years ago   1982's from childbirth   Visit for wound care    lower leg   Wound cellulitis 03/09/2022    MEDICATIONS: Current Outpatient Medications on File Prior to Visit  Medication Sig Dispense Refill   albuterol (PROAIR HFA) 108 (90 Base) MCG/ACT inhaler INHALE 2 PUFFS EVERY 4 HOURS AS NEEDED FOR WHEEZING AND SHORTNESS OF BREATH. 8.5 g 10   BIOTIN PO Take 1 capsule by mouth daily.     budesonide-formoterol (SYMBICORT) 160-4.5 MCG/ACT inhaler Inhale 2 puffs into the lungs 2 (two) times daily. 1 each 6   calcium-vitamin D (OSCAL WITH D) 500-200 MG-UNIT per tablet Take 1 tablet by mouth daily with breakfast.     carbamazepine (CARBATROL) 200 MG 12 hr capsule Take 1 capsule (200 mg total) by mouth 2 (two) times daily. 180 capsule 3   fexofenadine (ALLEGRA) 180 MG tablet Take 180 mg by mouth every morning.     Guaifenesin 1200 MG TB12 Take 1 tablet by mouth 2 (two) times daily as needed.     ibuprofen (ADVIL,MOTRIN) 400 MG tablet Take 400 mg by mouth every 8 (eight) hours as needed (joint pain).     losartan-hydrochlorothiazide (HYZAAR) 50-12.5 MG per tablet Take 1 tablet by mouth every morning.      melatonin 5 MG TABS Take 5 mg by mouth at bedtime. Takes 10mg  total HS     mometasone-formoterol (DULERA) 200-5 MCG/ACT AERO Take 2 puffs first thing in am and then another 2 puffs about 12 hours later. 1 each 11   Multiple Vitamin (MULTIVITAMIN) tablet Take 1 tablet by mouth every morning.     pantoprazole (PROTONIX) 40 MG tablet Take 30- 60 min before your first and last meals of the day 60 tablet 2   potassium chloride (K-DUR) 10 MEQ tablet Take 1 tablet by mouth daily. ((2 tabs on Mondays and Fridays))     Respiratory Therapy Supplies (FLUTTER) DEVI As instructed 1 each 0   Turmeric 500 MG CAPS Take 1 capsule by mouth every morning.      vitamin C (ASCORBIC ACID) 500 MG tablet Take 500 mg by mouth every morning.      No current facility-administered medications on file prior to visit.    ALLERGIES: Allergies  Allergen Reactions   Sulfonamide Derivatives Nausea And Vomiting    FAMILY HISTORY: Family History  Problem Relation Age of Onset   Colon cancer Neg Hx     SOCIAL HISTORY: Social History   Socioeconomic History   Marital status: Married    Spouse name: Not on file   Number of children: Not on file   Years of education: Not on file   Highest education level: Not on file  Occupational History   Not on file  Tobacco Use   Smoking status: Former    Packs/day: 0.10    Years: 5.00    Total pack years: 0.50    Types: Cigarettes    Quit date: 04/19/1984    Years since quitting: 38.0   Smokeless tobacco: Never   Tobacco comments:    reports smoked socially  Vaping Use   Vaping Use: Never used  Substance and  Sexual Activity   Alcohol use: Yes    Comment: occasional wine   Drug use: No   Sexual activity: Not on file  Other Topics Concern   Not on file  Social History Narrative   Right handed    Lives with her husband    Social Determinants of Health   Financial Resource Strain: Not on file  Food Insecurity: Not on file  Transportation Needs: Not on file  Physical Activity: Not on file  Stress: Not on file  Social Connections: Not on file  Intimate Partner Violence: Not on file     PHYSICAL EXAM: Vitals:   05/06/22 1156  BP: (!) 146/83  Pulse: 68  SpO2: 98%   General: No acute distress Head:  Normocephalic/atraumatic Skin/Extremities: No rash, no edema Neurological Exam: alert and awake. No aphasia or dysarthria. Fund of knowledge is appropriate. Attention and concentration are normal.   Cranial nerves: Pupils equal, round. Extraocular movements intact with no nystagmus. Visual fields full.  No facial asymmetry.  Motor: Bulk and tone normal, muscle strength 5/5 throughout with no pronator drift.   Finger to nose testing intact.  Gait narrow-based and steady, able  to tandem walk adequately.  Romberg negative.   IMPRESSION: This is a pleasant 77 yo RH woman with a history of hypertension, complex partial seizures, seizure-free since 2000 on carbamazepine ER 200mg  BID, who had 2 brief episodes of left arm/hand weakness/heaviness, concerning for TIA. Last episode in August 2021. MRI brain/MRA head and neck no acute changes, no flow-limiting stenosis. She has been event-free since 2021, neurological exam today normal. She reports speaking to her cardiologist and decided to hold off on aspirin, continue control of vascular risk factors. 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 If it continues to be stable, repeat imaging will be in 5 years unless any change in symptoms. She is aware of [place] driving laws to stop driving after a seizure until 6 months seizure-free. FOllow-up in 1 year, call for any changes.    Thank you for allowing me to participate in her care.  Please do not hesitate to call for any questions or concerns.    Patrcia Dolly, M.D.   CC: Dr. Sherene Sires

## 2022-05-06 NOTE — Patient Instructions (Signed)
Good to see you doing well.  Continue carbamazepine '200mg'$ : Take 1 tablet twice a day  2. Schedule follow-up MRI brain with and without contrast at [facility].   3. Follow-up in 1 year, call for any changes   Seizure Precautions: 1. If medication has been prescribed for you to prevent seizures, take it exactly as directed.  Do not stop taking the medicine without talking to your doctor first, even if you have not had a seizure in a long time.   2. Avoid activities in which a seizure would cause danger to yourself or to others.  Don't operate dangerous machinery, swim alone, or climb in high or dangerous places, such as on ladders, roofs, or girders.  Do not drive unless your doctor says you may.  3. If you have any warning that you may have a seizure, lay down in a safe place where you can't hurt yourself.    4.  No driving for 6 months from last seizure, as per [facility].   Please refer to the following link on the [facility] website for more information: http://www.epilepsyfoundation.org/answerplace/Social/driving/drivingu.cfm   5.  Maintain good sleep hygiene. Avoid alcohol.  6.  Contact your doctor if you have any problems that may be related to the medicine you are taking.  7.  Call 911 and bring the patient back to the ED if:        A.  The seizure lasts longer than 5 minutes.       B.  The patient doesn't awaken shortly after the seizure  C.  The patient has new problems such as difficulty seeing, speaking or moving  D.  The patient was injured during the seizure  E.  The patient has a temperature over 102 F (39C)  F.  The patient vomited and now is having trouble breathing

## 2022-05-13 ENCOUNTER — Ambulatory Visit (HOSPITAL_BASED_OUTPATIENT_CLINIC_OR_DEPARTMENT_OTHER): Payer: Medicare Other | Admitting: General Surgery

## 2022-05-14 ENCOUNTER — Telehealth: Payer: Self-pay | Admitting: Internal Medicine

## 2022-05-17 ENCOUNTER — Telehealth: Payer: Self-pay | Admitting: Neurology

## 2022-05-17 MED ORDER — PANTOPRAZOLE SODIUM 40 MG PO TBEC: 40.0000 mg | DELAYED_RELEASE_TABLET | Freq: Two times a day (BID) | ORAL | 1 refills | Status: DC

## 2022-05-17 NOTE — Telephone Encounter (Signed)
Pt called informed that I had refaxed her order to the [facility] imaging center for her,

## 2022-05-17 NOTE — Telephone Encounter (Signed)
Pt called in stating she has not heard anything from the imaging center in [place] about scheduling her MRI. She called them and they have not gotten a referral. Their fax number is [phone].

## 2022-05-17 NOTE — Telephone Encounter (Signed)
Called and spoke with pt letting her know the dosage of the Symbicort that Dr. Melvyn Novas prescribed and also verified pharmacy to send pantoprazole refill to. Rx sent to preferred pharmacy for pt. Nothing further needed.

## 2022-05-25 ENCOUNTER — Telehealth: Payer: Self-pay

## 2022-05-25 NOTE — Telephone Encounter (Signed)
Pt called no answer left a voice mail to call the office back  °

## 2022-05-25 NOTE — Telephone Encounter (Signed)
-----   Message from Cameron Sprang, MD sent at 05/24/2022  4:03 PM EST ----- Regarding: MRI Pls let her know that MRI brain looks fine, no changes from her last exam. At this point, imaging will be done in 5 years unless any change in symptoms. Thanks  ----- Message ----- From: Rusty Aus Sent: 05/24/2022   2:22 PM EST To: Cameron Sprang, MD

## 2022-05-25 NOTE — Telephone Encounter (Signed)
Pt called an informed that MRI brain looks fine, no changes from her last exam. At this point, imaging will be done in 5 years unless any change in symptoms.

## 2022-05-25 NOTE — Telephone Encounter (Signed)
Patient is returning a call. °

## 2022-06-04 ENCOUNTER — Ambulatory Visit: Payer: PRIVATE HEALTH INSURANCE | Admitting: Neurology

## 2022-06-20 NOTE — Progress Notes (Unsigned)
Subjective:     Patient ID: Allison Gross, female   DOB: 05-06-1945   MRN: [id number]   Brief patient profile:  38   yowf quit light smoking 1973 with intermittent sinus/ bronchitis but no chronic c/o's or need for rx not much better after stopped smoking then much worse after around 2000 despite daily maint rx for asthma. Previously found to be allergic to dust, cats and trees and mold but no seasonal pattern and did not feel allergy shots helped in 1990s      History of Present Illness  09/21/2013 Acute OV  Complains of hoarseness, some increased SOB, low grade temp, dry hacking cough with occasional yellow mucus, some tightness x 1 week.  She denies any calf pain, edema, hemoptysis, orthopnea, PND, nausea, vomiting, diarrhea. Does feel, that she's had a low-grade fever. Returned yesterday from a 2 week cruise.  Says many people on the cruise were sick with cough and similar symptoms. Was in [place].  Finished zpak on 6/3 and pred pak today by ships' physician. Has been using tramadol to today for cough with minimal help rec Levaquin 500mg  daily for 7 days  Prednisone taper over next week.  Delsym 2 tsp Twice daily   For cough  Tramadol 50mg  1 every 4hr as needed for breakthrough cough.  Mucinex Twice daily  As needed  Congestion . 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  She has not had to use her albuterol inhaler or neb recently.   Dyspnea:  Walking  Not as much but Not limited by breathing from desired activities   Cough: resolved/ off tramadol  Sleeping: fine flat bed with large pillow SABA use: occ before ex rec If cough flares, use tramadol 50 mg up to every 4 hours or up to 3 days and if not improving we need to see you right away  Cough started a few days  prior to Dec 21 POS COVID  testing and the "worse ever since"   05/05/2021  f/u ov/Allison Gross re: cough variant asthma   maint on symb 80 2bid / ppi ac  H1 did not help the noct cough  Chief Complaint  Patient presents with   Follow-up    Cough is better but has not resolved completely. She has been using neb 2 x daily.   Best rx sipping warm water Dyspnea: better  Cough: better x 4-5 days but still nocturnla worse despite 1st gen H1 blockers per guidelines   Sleeping: flat bed with pillow  SABA use: not sure  02: none  Covid status:   vax x 4  Rec Take delsym two tsp every 12 hours and supplement if needed with  tramadol 50 mg up to 1-2 every 4 hours  Once you have eliminated the cough for 3 straight days try reducing the tramadol first,  then the delsym as tolerated.   When cough flares > protonix  40 mg Take 30- 60 min before your first and last meals of the day until better week and no need for cough medication for a week. Please schedule a follow up visit in 3 months but call sooner if needed     03/17/2022  Acute  ov/Allison Gross re: cough variant asthma  maint on symbicort  80 2bid   Chief Complaint  Patient presents with   Acute Visit    Increased SOB over the past few months- gets winded walking up stairs. Currently on abx for cellulitis.   Dyspnea:  onset summer 2023 not really progressive, does better p albuterol / no worse since L leg wound from car door Jan 23 2022  Cough: good control Sleeping: bed blocks one pillow  SABA use: avg twice weekly  02: none  Covid status:   vax max  Rec Change symbicort to dulera 200 Take 2 puffs first thing in am and then another 2 puffs about 12 hours later.  and let me know if not better in a few weeks.     06/21/2022  f/u ov/Allison Gross re: ***   maint on ***  No chief complaint on file.   Dyspnea:  *** Cough: *** Sleeping: *** SABA use: *** 02: *** Covid status:   *** Lung cancer screening :  ***    No obvious day to day or daytime  variability or assoc excess/ purulent sputum or mucus plugs or hemoptysis or cp or chest tightness, subjective wheeze or overt sinus or hb symptoms.   *** without nocturnal  or early am exacerbation  of respiratory  c/o's or need for noct saba. Also denies any obvious fluctuation of symptoms with weather or environmental changes or other aggravating or alleviating factors except as outlined above   No unusual exposure hx or h/o childhood pna/ asthma or knowledge of premature birth.  Current Allergies, Complete Past Medical History, Past Surgical History, Family History, and Social History were reviewed in [employer] record.  ROS  The following are not active complaints unless bolded Hoarseness, sore throat, dysphagia, dental problems, itching, sneezing,  nasal congestion or discharge of excess mucus or purulent secretions, ear ache,   fever, chills, sweats, unintended wt loss or wt gain, classically pleuritic or exertional cp,  orthopnea pnd or arm/hand swelling  or leg swelling, presyncope, palpitations, abdominal pain, anorexia, nausea, vomiting, diarrhea  or change in bowel habits or change in bladder habits, change in stools or change in urine, dysuria, hematuria,  rash, arthralgias, visual complaints, headache, numbness, weakness or ataxia or problems with walking or coordination,  change in mood or  memory.        No outpatient medications have been marked as taking for the 06/21/22 encounter (Appointment) with Nyoka Cowden, MD.                  Past Medical History:  Asthma  - HFA 50% November 10, 2009 > 75% December 24, 2009 > 90% March 26, 2010  Osteopenia  - hold fosfamax 09/2009 >> better with active HB so rec Reclast rx         Objective:  Physical Exam  Wts 06/21/2022       ***  03/17/2022  155 05/05/2021   153  10/25/2018    158  02/21/2018    155  07/14/2012    158 = baseline /   11/13/2014 162 > 02/14/2015   159 >  06/09/2015  157 > 11/13/2015  156 >  05/17/2016  161 > 08/02/2016  161 >  12/16/2016  158 > 06/13/2017  160 > 12/14/2017  95%  Vital signs reviewed  06/21/2022  - Note at rest 02 sats  ***% on ***   General appearance:    ***           Assessment:

## 2022-06-21 ENCOUNTER — Ambulatory Visit (INDEPENDENT_AMBULATORY_CARE_PROVIDER_SITE_OTHER): Payer: Medicare Other | Admitting: Internal Medicine

## 2022-06-21 ENCOUNTER — Encounter: Payer: Self-pay | Admitting: Internal Medicine

## 2022-06-21 VITALS — BP 138/78 | HR 63 | Temp 97.9°F | Ht 60.0 in | Wt 153.4 lb

## 2022-06-21 DIAGNOSIS — J45991 Cough variant asthma: Secondary | ICD-10-CM | POA: Diagnosis not present

## 2022-06-21 NOTE — Patient Instructions (Signed)
No change in medications   Please schedule a follow up visit in 12  months but call sooner if needed  

## 2022-06-21 NOTE — Assessment & Plan Note (Signed)
Onset around 2000  - HFA 90% p coaching 04/23/11 - c/o hoarseness > added spacer 12/01/2012  > improved but stopped using it  - maint on symbicort 80 2bid  - try taper off gerd rx 11/13/14 > flared end of August 2016  - NO 05/21/2015  = 5  - Spirometry 05/17/2016  Nl p am symbicort 80 x 2 and doe reported   - Spirometry 06/13/2017  FEV1 1.34 (68%)  Ratio 73 p am symb 80   X 2   - 03/17/2022 changed to  dulera to 200 bid   p reported increased need for saba x several months -  06/21/2022  After extensive coaching inhaler device,  effectiveness =  90%   All goals of chronic asthma control met including optimal function and elimination of symptoms with minimal need for rescue therapy.  Contingencies discussed in full including contacting this office immediately if not controlling the symptoms using the rule of two's.     No change rx  F/u can be yearly, sooner prn

## 2022-08-04 NOTE — Telephone Encounter (Signed)
c 

## 2022-09-08 ENCOUNTER — Telehealth: Payer: Self-pay | Admitting: Internal Medicine

## 2022-09-08 NOTE — Telephone Encounter (Signed)
Patient called pharmacy on Monday for refill on rescue inhaler, Ventolin,. States pharmacy still does not have the authorization for it. Please advise- uses [facility] in [place].

## 2022-09-09 MED ORDER — ALBUTEROL SULFATE HFA 108 (90 BASE) MCG/ACT IN AERS: INHALATION_SPRAY | RESPIRATORY_TRACT | 10 refills | Status: DC

## 2022-09-09 NOTE — Telephone Encounter (Signed)
Rx for pt's rescue inhaler has been sent to the pharmacy for pt. Called and spoke with pt letting her know that this was done and she verbalized understanding. Nothing further needed.

## 2022-12-01 ENCOUNTER — Other Ambulatory Visit: Payer: Self-pay | Admitting: Internal Medicine

## 2023-01-03 ENCOUNTER — Telehealth: Payer: Self-pay | Admitting: Internal Medicine

## 2023-01-03 MED ORDER — PREDNISONE 10 MG PO TABS: ORAL_TABLET | ORAL | 0 refills | Status: AC

## 2023-01-03 MED ORDER — AZITHROMYCIN 250 MG PO TABS: ORAL_TABLET | ORAL | 0 refills | Status: AC

## 2023-01-03 NOTE — Telephone Encounter (Signed)
Attempted to contact patient. No answer. Voicemail left requesting return call.   Rxs sent to pharmacy.

## 2023-01-03 NOTE — Telephone Encounter (Signed)
Prednisone 10 mg take  4 each am x 2 days,   2 each am x 2 days,  1 each am x 2 days and stop   Zpak  The tramadol can be used more adjusted up to as much as 100 mg every 4 h prn but should not be mixed with other cough meds x for mucinex dm 1200 or delsym, whichever she prefers

## 2023-01-03 NOTE — Telephone Encounter (Signed)
Primary Pulmonologist: Wert Last office visit and with whom: 06/21/2022 Wert What do we see them for (pulmonary problems): Asthma, upper airway cough Last OV assessment/plan:     Was appointment offered to patient (explain)?  None available   Reason for call: Husband has bad case of Covid, she has tested negative twice, one with a PCR.  She started coughing up clear mucous on 9/11.  Cough is waking her up at night.  She is using the generic Symbicort.  She has been using the Tramadol for 4 days which is helping, but is only supposed to use for 5 days.  She is using OTC Delsym.  She is needing something at night time for cough (cough syrup).  Dr. Sherene Sires, please advise.  Thank you.  (examples of things to ask: : When did symptoms start? Fever? Cough? Productive? Color to sputum? More sputum than usual? Wheezing? Have you needed increased oxygen? Are you taking your respiratory medications? What over the counter measures have you tried?)  Allergies  Allergen Reactions   Sulfonamide Derivatives Nausea And Vomiting    Immunization History  Administered Date(s) Administered   Fluad Quad(high Dose 65+) 01/28/2021, 01/17/2022   Influenza Split 02/10/2011, 01/18/2015, 01/18/2016   Influenza Whole 01/18/2012, 01/27/2012   Influenza, High Dose Seasonal PF 02/10/2018   Influenza,inj,Quad PF,6+ Mos 01/03/2013   Influenza-Unspecified 01/17/2014, 01/17/2017   PFIZER(Purple Top)SARS-COV-2 Vaccination 05/05/2019, 05/26/2019, 01/11/2020, 06/10/2020, 08/26/2020   Pneumococcal Polysaccharide-23 01/18/2007

## 2023-01-10 MED ORDER — PROMETHAZINE-DM 6.25-15 MG/5ML PO SYRP: 5.0000 mL | ORAL_SOLUTION | Freq: Four times a day (QID) | ORAL | 1 refills | Status: DC | PRN

## 2023-01-10 NOTE — Telephone Encounter (Signed)
PT calling again stating the pred has kept her up she has not been able to get quality sleep. She would like a sleep medicine Dr. Sherene Sires prescribed a few years ago to help with sleep. She is not sure what it is called. Pls call to advise.  [facility] in [place], [place]  Her # is [phone]  She needs it called in as early as possible. Leaving for a trip at 1:00  If after 1:00 [facility] [address]. [address]. [phone]

## 2023-01-10 NOTE — Telephone Encounter (Signed)
Done

## 2023-01-10 NOTE — Telephone Encounter (Signed)
Can't find it in the notes - see if  she can contact her drug store and idientify it and call us back  and in meantime will need a f/u to see me next available

## 2023-01-10 NOTE — Telephone Encounter (Signed)
ATC patient.  Left message to call back.

## 2023-01-10 NOTE — Telephone Encounter (Signed)
Called and spoke with patient to clarify medication.  Patient stated she was on Promethazine to help with sleep and cough.  Patient has scheduled follow up with Dr. Sherene Sires and request prescription to go to [facility]. [place], [place].  Message routed to Dr. Sherene Sires to Advise

## 2023-01-10 NOTE — Telephone Encounter (Signed)
PT ret Lisa's call. States the medication was TROMETHAZINE.  Call in to [facility] please. She did make a FU appt as well.

## 2023-01-20 ENCOUNTER — Encounter: Payer: Self-pay | Admitting: Internal Medicine

## 2023-01-20 ENCOUNTER — Ambulatory Visit: Payer: Medicare Other | Admitting: Internal Medicine

## 2023-01-20 ENCOUNTER — Ambulatory Visit (INDEPENDENT_AMBULATORY_CARE_PROVIDER_SITE_OTHER): Payer: Medicare Other

## 2023-01-20 VITALS — BP 128/76 | HR 80 | Temp 97.1°F | Ht 60.0 in | Wt 149.2 lb

## 2023-01-20 DIAGNOSIS — J45991 Cough variant asthma: Secondary | ICD-10-CM | POA: Diagnosis not present

## 2023-01-20 DIAGNOSIS — R058 Other specified cough: Secondary | ICD-10-CM | POA: Diagnosis not present

## 2023-01-20 MED ORDER — TRAMADOL HCL 50 MG PO TABS: 50.0000 mg | ORAL_TABLET | ORAL | 0 refills | Status: AC | PRN

## 2023-01-20 MED ORDER — METHYLPREDNISOLONE ACETATE 80 MG/ML IJ SUSP
120.0000 mg | Freq: Once | INTRAMUSCULAR | Status: AC
  Administered 2023-01-20: 120 mg via INTRAMUSCULAR

## 2023-01-20 NOTE — Assessment & Plan Note (Signed)
-   HFA 90% p coaching 04/23/11 - c/o hoarseness > added spacer 12/01/2012  > improved but stopped using it  - maint on symbicort 80 2bid  - try taper off gerd rx 11/13/14 > flared end of August 2016  - NO 05/21/2015  = 5  - Spirometry 05/17/2016  Nl p am symbicort 80 x 2 and doe reported   - Spirometry 06/13/2017  FEV1 1.34 (68%)  Ratio 73 p am symb 80   X 2   - 03/17/2022 changed to  dulera to 200 bid   p reported increased need for saba x several months -  06/21/2022  After extensive coaching inhaler device,  effectiveness =  90%> continue symbicort 160   No flare despite exac of cough so All goals of chronic asthma control met including optimal function and elimination of symptoms with minimal need for rescue therapy.  Contingencies discussed in full including contacting this office immediately if not controlling the symptoms using the rule of two's.     F/u q 6 m

## 2023-01-20 NOTE — Progress Notes (Addendum)
Subjective:     Patient ID: Allison Gross, female   DOB: 1945-05-04   MRN: [id number]   Brief patient profile:  84   yowf quit light smoking 1973 with intermittent sinus/ bronchitis but no chronic c/o's or need for rx not much better after stopped smoking then much worse after around 2000 despite daily maint rx for asthma. Previously found to be allergic to dust, cats and trees and mold but no seasonal pattern and did not feel allergy shots helped in 1990s      History of Present Illness  09/21/2013 Acute OV  Complains of hoarseness, some increased SOB, low grade temp, dry hacking cough with occasional yellow mucus, some tightness x 1 week.  She denies any calf pain, edema, hemoptysis, orthopnea, PND, nausea, vomiting, diarrhea. Does feel, that she's had a low-grade fever. Returned yesterday from a 2 week cruise.  Says many people on the cruise were sick with cough and similar symptoms. Was in [place].  Finished zpak on 6/3 and pred pak today by ships' physician. Has been using tramadol to today for cough with minimal help rec Levaquin 500mg  daily for 7 days  Prednisone taper over next week.  Delsym 2 tsp Twice daily   For cough  Tramadol 50mg  1 every 4hr as needed for breakthrough cough.  Mucinex Twice daily  As needed  Congestion . Saline nasal rinses As needed   Continue on Chlortrimeton 4mg  2 tabs At bedtime       Cough started a few days prior to Apr 08 2022 POS COVID  testing and the "worse ever since"   05/05/2021  f/u ov/Allison Gross re: cough variant asthma   maint on symb 80 2bid / ppi ac  H1 did not help the noct cough  Chief Complaint  Patient presents with   Follow-up    Cough is better but has not resolved completely. She has been using neb 2 x daily.   Best rx sipping warm water Dyspnea: better  Cough: better x 4-5 days but still nocturnla worse despite 1st gen H1 blockers per guidelines   Sleeping: flat bed with pillow  SABA use: not sure  02: none  Covid  status:   vax x 4  Rec Take delsym two tsp every 12 hours and supplement if needed with  tramadol 50 mg up to 1-2 every 4 hours  Once you have eliminated the cough for 3 straight days try reducing the tramadol first,  then the delsym as tolerated.   When cough flares > protonix  40 mg Take 30- 60 min before your first and last meals of the day until better week and no need for cough medication for a week. Please schedule a follow up visit in 3 months but call sooner if needed    01/20/2023  ACUTE ov/Allison Gross re: asthma with recurrent cough  maint on symbicort 160 / ppi bid ac   Chief Complaint  Patient presents with   Acute Visit    Cough and wheeze x 3 weeks  Dyspnea:  no more doe than usual  despite flair of cough  Cough: 80% better . Min mucoid production- never able to suppress urge to cough but only took a few tramadol per day at most  Sleeping: bed blocks or sitting up does better  but only used  one  h1 at hs  SABA use: not using now     No obvious day to day or daytime variability or assoc purulent sputum or  mucus plugs or hemoptysis or cp or chest tightness,   or overt sinus or hb symptoms.    Also denies any obvious fluctuation of symptoms with weather or environmental changes or other aggravating or alleviating factors except as outlined above   No unusual exposure hx or h/o childhood pna/ asthma or knowledge of premature birth.  Current Allergies, Complete Past Medical History, Past Surgical History, Family History, and Social History were reviewed in [employer] record.  ROS  The following are not active complaints unless bolded Hoarseness, sore throat, dysphagia, dental problems, itching, sneezing,  nasal congestion or discharge of excess mucus or purulent secretions, ear ache,   fever, chills, sweats, unintended wt loss or wt gain, classically pleuritic or exertional cp,  orthopnea pnd or arm/hand swelling  or leg swelling, presyncope, palpitations,  abdominal pain, anorexia, nausea, vomiting, diarrhea  or change in bowel habits or change in bladder habits, change in stools or change in urine, dysuria, hematuria,  rash, arthralgias, visual complaints, headache, numbness, weakness or ataxia or problems with walking or coordination,  change in mood or  memory.        Current Meds  Medication Sig   albuterol (PROAIR HFA) 108 (90 Base) MCG/ACT inhaler INHALE 2 PUFFS EVERY 4 HOURS AS NEEDED FOR WHEEZING AND SHORTNESS OF BREATH.   BIOTIN PO Take 1 capsule by mouth daily.   budesonide-formoterol (SYMBICORT) 160-4.5 MCG/ACT inhaler INHALE TWO PUFFS TWICE A DAY.   calcium-vitamin D (OSCAL WITH D) 500-200 MG-UNIT per tablet Take 1 tablet by mouth daily with breakfast.   carbamazepine (CARBATROL) 200 MG 12 hr capsule Take 1 capsule (200 mg total) by mouth 2 (two) times daily.   fexofenadine (ALLEGRA) 180 MG tablet Take 180 mg by mouth every morning.   losartan-hydrochlorothiazide (HYZAAR) 50-12.5 MG per tablet Take 1 tablet by mouth every morning.    Melatonin 10 MG TABS Take 10 mg by mouth at bedtime.   Multiple Vitamin (MULTIVITAMIN) tablet Take 1 tablet by mouth every morning.   pantoprazole (PROTONIX) 40 MG tablet Take 1 tablet (40 mg total) by mouth 2 (two) times daily before a meal. Take 30- 60 min before your first and last meals of the day   potassium chloride (K-DUR) 10 MEQ tablet Take 1 tablet by mouth daily. ((2 tabs on Mondays and Fridays))   Respiratory Therapy Supplies (FLUTTER) DEVI As instructed   Turmeric 500 MG CAPS Take 1 capsule by mouth every morning.    vitamin C (ASCORBIC ACID) 500 MG tablet Take 500 mg by mouth every morning.                 Past Medical History:  Asthma  - HFA 50% November 10, 2009 > 75% December 24, 2009 > 90% March 26, 2010  Osteopenia  - hold fosfamax 09/2009 >> better with active HB so rec Reclast rx         Objective:  Physical Exam  Wts  01/20/2023   149   06/21/2022     155   03/17/2022   155 05/05/2021   153  10/25/2018    158  02/21/2018    155  07/14/2012    158 = baseline /   11/13/2014 162 > 02/14/2015   159 >  06/09/2015  157 > 11/13/2015  156 > 05/17/2016  161 > 08/02/2016  161 >  12/16/2016  158 > 06/13/2017  160 > 12/14/2017  95%    Vital signs reviewed  01/20/2023  -  Note at rest 02 sats  98% on RA   General appearance:    amb somber wf nad   HEENT : Oropharynx  clear      Nasal turbinates nl    NECK :  without  apparent JVD/ palpable Nodes/TM    LUNGS: no acc muscle use,  Mod kypotic  contour chest which is clear to A and P bilaterally without cough on insp or exp maneuvers   CV:  RRR  no s3 or murmur or increase in P2, and no edema   ABD:  soft and nontender with nl inspiratory excursion in the supine position. No bruits or organomegaly appreciated   MS:  Nl gait/ ext warm without deformities Or obvious joint restrictions  calf tenderness, cyanosis or clubbing    SKIN: warm and dry without lesions    NEURO:  alert, approp, nl sensorium with  no motor or cerebellar deficits apparent.        CXR PA and Lateral:   01/20/2023 :    I personally reviewed images and impression is as follows:     Mod kyphosis/ large HH / no acute findings            Assessment:

## 2023-01-20 NOTE — Assessment & Plan Note (Signed)
Worse since  2000 despite max asthma rx  assoc large HH  - failed singulair around 2005  - allergy profile 08/14/2014 >  IgE  129 with pos RAST Dust >  Cat > dog   - Spirometry 05/17/2016  wnl during flare of chronic cough - gabapentin 100 mg tid as part of action plan rec 08/02/2016  - repeat Allergy profile 06/13/2017 >  Eos 0.1 /  IgE 62 same rast panel as 08/14/14 study     Flare with uri/ neg covid testing but was exposed to husband with it.   Of the three most common causes of  Sub-acute / recurrent or chronic cough, only one (GERD)  can actually contribute to/ trigger  the other two (asthma and post nasal drip syndrome)  and perpetuate the cylce of cough.  While not intuitively obvious, many patients with chronic low grade reflux do not cough until there is a primary insult that disturbs the protective epithelial barrier and exposes sensitive nerve endings.   This is typically viral but can due to PNDS and  either may apply here.   The point is that once this occurs, it is difficult to eliminate the cycle  using anything but a maximally effective acid suppression regimen at least in the short run, accompanied by an appropriate diet to address non acid GERD and control / completely eliminate the cough itself for at least 3 days with tramadol  50 mg dosed up to 2 q 4h if needed >>> also  added depomedrol 120mg  IM  in case of component of Th-2 driven upper or lower airways inflammation (if cough responds short term only to relapse before return while will on full rx for uacs (as above), then  that would point to allergic rhinitis/ asthma or eos bronchitis as alternative dx)          Each maintenance medication was reviewed in detail including emphasizing most importantly the difference between maintenance and prns and under what circumstances the prns are to be triggered using an action plan format where appropriate.  Total time for H and P, chart review, counseling, reviewing hfa device(s) and  generating customized AVS unique to this acute office visit / same day charting = 30 min

## 2023-01-20 NOTE — Patient Instructions (Addendum)
Please remember to go to the  x-ray department  for your tests - we will call you with the results when they are available    Depomedrol 120 mg IM   For drainage / throat tickle try take CHLORPHENIRAMINE  4 mg  ("Allergy Relief" 4mg   at [facility] should be easiest to find in the blue box usually on bottom shelf)  take one every 4 hours as needed - extremely effective and inexpensive over the counter- may cause drowsiness so start with just a dose or two an hour before bedtime and see how you tolerate it before trying in daytime.   Take delsym two tsp every 12 hours and supplement if needed with  tramadol 50 mg up to 2 every 4 hours to suppress the urge to cough. Swallowing water and/or using ice chips/non mint and menthol containing candies (such as lifesavers or sugarless jolly ranchers) are also effective.  You should rest your voice and avoid activities that you know make you cough.  Once you have eliminated the cough for 3 straight days try reducing the tramadol first,  then the delsym as tolerated.     Please schedule a follow up visit in 6 months but call sooner if needed

## 2023-03-08 ENCOUNTER — Telehealth: Payer: Self-pay | Admitting: Internal Medicine

## 2023-03-08 NOTE — Telephone Encounter (Signed)
Long time Dr. Sherene Sires PT turning 78. Should she get an RSV shot? Her # is [phone]

## 2023-03-14 NOTE — Telephone Encounter (Signed)
Yes, happy birthday!

## 2023-03-15 NOTE — Telephone Encounter (Signed)
Patient already received RSV vaccine. Imm's updated based on patient recall of event.

## 2023-05-06 ENCOUNTER — Ambulatory Visit (INDEPENDENT_AMBULATORY_CARE_PROVIDER_SITE_OTHER): Payer: Medicare Other | Admitting: Neurology

## 2023-05-06 ENCOUNTER — Encounter: Payer: Self-pay | Admitting: Neurology

## 2023-05-06 VITALS — BP 141/76 | HR 78 | Ht 60.0 in | Wt 152.8 lb

## 2023-05-06 DIAGNOSIS — Z8673 Personal history of transient ischemic attack (TIA), and cerebral infarction without residual deficits: Secondary | ICD-10-CM | POA: Diagnosis not present

## 2023-05-06 DIAGNOSIS — D333 Benign neoplasm of cranial nerves: Secondary | ICD-10-CM

## 2023-05-06 DIAGNOSIS — G40009 Localization-related (focal) (partial) idiopathic epilepsy and epileptic syndromes with seizures of localized onset, not intractable, without status epilepticus: Secondary | ICD-10-CM

## 2023-05-06 MED ORDER — CARBAMAZEPINE ER 200 MG PO CP12: 200.0000 mg | ORAL_CAPSULE | Freq: Two times a day (BID) | ORAL | 4 refills | Status: DC

## 2023-05-06 NOTE — Patient Instructions (Signed)
Good to see you doing well! Continue Carbamazepine ER 200mg  twice a day. Follow-up in 1 year, call for any changes.   Seizure Precautions: 1. If medication has been prescribed for you to prevent seizures, take it exactly as directed.  Do not stop taking the medicine without talking to your doctor first, even if you have not had a seizure in a long time.   2. Avoid activities in which a seizure would cause danger to yourself or to others.  Don't operate dangerous machinery, swim alone, or climb in high or dangerous places, such as on ladders, roofs, or girders.  Do not drive unless your doctor says you may.  3. If you have any warning that you may have a seizure, lay down in a safe place where you can't hurt yourself.    4.  No driving for 6 months from last seizure, as per [facility]   Please refer to the following link on the [employer]ile[employer] of [employer]'s website for more information: http://www.epilepsyfoundation.org/answerplace/Social/driving/drivingu.cfm   5.  Maintain good sleep hygiene. Avoid alcohol.  6.  Contact your doctor if you have any problems that may be related to the medicine you are taking.  7.  Call 911 and bring the patient back to the ED if:        A.  The seizure lasts longer than 5 minutes.       B.  The patient doesn't awaken shortly after the seizure  C.  The patient has new problems such as difficulty seeing, speaking or moving  D.  The patient was injured during the seizure  E.  The patient has a temperature over 102 F (39C)  F.  The patient vomited and now is having trouble breathing

## 2023-05-06 NOTE — Progress Notes (Signed)
NEUROLOGY FOLLOW UP OFFICE NOTE  Allison Gross [id number] Mar 02, 1946  HISTORY OF PRESENT ILLNESS: I had the pleasure of seeing Allison Gross in follow-up in the neurology clinic on 05/06/2023.  The patient was last seen a year ago. She is alone in the office today. She has a history of well-controlled seizures on carbamazepine ER 200mg  BID, seizure-free since 2000. She presented in 2021 after 2 transient episodes of left hand weakness and heaviness. MRI brain with and without contrast no acute changes. TIA workeup unremarkable, we had discussed starting aspirin but she reports speaking to her cardiologist who she states did not think she needed aspirin. She continues to do well with no further episodes of left hand weakness. She denies any staring/unresponsive episodes, gaps in time, olfactory/gustatory hallucinations, focal numbness/tingling/weakness, myoclonic jerks. No headaches, dizziness, hearing loss, tinnitus, no falls. She is scheduled for laser eye surgery soon. The only thing she has noticed is she is slower to do things, such as their family Christmas dinner last month. 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 She usually gets 6-7 hours of sleep in the winter and takes a brief nap in the afternoon. Mood is pretty good. She lives with her husband and drives.    History on Initial Assessment 12/31/2019: This is a pleasant 78 year old right-handed woman with a history of hypertension, complex partial seizures, presenting for second opinion. She had been seeing neurologist Dr. Maryln Gottron since at least 2013, records were reviewed. She was last seen in January 2021 with note of well-controlled seizures since on carbamazepine ER 200mg  BID. Her last carbamazepine level in January 2021 was 8.5. She reports having brief passing out episodes at age 24. She  had preeclampsia with her first child in 51 and had post-partum convulsions. 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 It was not noticeable to others, she just held on to the table. She had 2 or 3 of them and was seen by Neurology and told there was a lesion on the left side. She was started on carbamazepine. She reports the last seizure was in 2000 and at that time dose was increased to carbamazepine ER 200mg  BID. She denies any side effects. Her last bone density scan done 8 years ago showed osteopenia, she was briefly on Fosamax, which was stopped by another provider. She denies any staring/unresponsive episodes, gaps in time, olfactory/gustatory hallucinations, myoclonic jerks. She denies any headaches, dizziness, diplopia, dysarthria/dysphagia, neck/back pain, bowel/bladder dysfunction. Her last fall was 2 months ago.   After she was referred and scheduled for this visit, she has also had 2 recent episodes that occurred a week apart at the end of August. The first episode occurred while she was holding the TV remote control with her left hand and she had weakness in her left hand and a heaviness in her left arm. It felt like it went out for a minute, no numbness/tingling/pain, then she felt fine. No associated headache, confusion. The second episode was at the end of August, she was typing on her computer then had the same sensation of weakness/heaviness in her left arm lasting 45-60 seconds. She also had one episode at night where there was a burning/itchiness on her left hand radiating to her fingers that lasted  for 10 minutes. Face and leg were not affected. She reports having normal cholesterol levels when checked by her cardiologist, LDL in the 30s. Last bloodwork was over a year ago. She reports having an echocardiogram 4-6 weeks ago, reportedly normal.   Epilepsy Risk Factors:  She had a normal birth  and early development.  There is no history of febrile convulsions, CNS infections such as meningitis/encephalitis, significant traumatic brain injury, neurosurgical procedures, or family history of seizures.  Diagnostic Data: MRI brain with and without contrast and MRA head and neck done 01/2020 did not show any acute changes, there was mild diffuse atrophy and chronic microvascular disease. 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   MRI brain with and without contrast 05/2022 stable right CP angle mass.  PAST MEDICAL HISTORY: Past Medical History:  Diagnosis Date   Asthma    Cancer (HCC)    basil,squamous   Hypertension    Osteopenia    Seizures (HCC) 15 years ago   1982's from childbirth   Visit for wound care    lower leg   Wound cellulitis 03/09/2022    MEDICATIONS: Current Outpatient Medications on File Prior to Visit  Medication Sig Dispense Refill   albuterol (PROAIR HFA) 108 (90 Base) MCG/ACT inhaler INHALE 2 PUFFS EVERY 4 HOURS AS NEEDED FOR WHEEZING AND SHORTNESS OF BREATH. 8.5 g 10   BIOTIN PO Take 1 capsule by mouth daily.     budesonide-formoterol (SYMBICORT) 160-4.5 MCG/ACT inhaler INHALE TWO PUFFS TWICE A DAY. 10.2 g 11   calcium-vitamin D (OSCAL WITH D) 500-200 MG-UNIT per tablet Take 1 tablet by mouth daily with breakfast.     carbamazepine (CARBATROL) 200 MG 12 hr capsule Take 1 capsule (200 mg total) by mouth 2 (two) times daily. 180 capsule 3   fexofenadine (ALLEGRA) 180 MG tablet Take 180 mg by mouth every morning.     losartan-hydrochlorothiazide (HYZAAR) 50-12.5 MG per tablet Take 1 tablet by mouth every morning.      Melatonin 10 MG TABS Take 10 mg by mouth at bedtime.     Multiple  Vitamin (MULTIVITAMIN) tablet Take 1 tablet by mouth every morning.     pantoprazole (PROTONIX) 40 MG tablet Take 1 tablet (40 mg total) by mouth 2 (two) times daily before a meal. Take 30- 60 min before your first and last meals of the day (Patient taking differently: Take 40 mg by mouth daily. Take 30- 60 min before your first and last meals of the day) 180 tablet 1   potassium chloride (K-DUR) 10 MEQ tablet Take 1 tablet by mouth daily. ((2 tabs on Mondays and Fridays))     Respiratory Therapy Supplies (FLUTTER) DEVI As instructed 1 each 0   Turmeric 500 MG CAPS Take 1 capsule by mouth every morning.      vitamin C (ASCORBIC ACID) 500 MG tablet Take 500 mg by mouth every morning.     No current facility-administered medications on file prior to visit.    ALLERGIES: Allergies  Allergen Reactions   Sulfonamide Derivatives Nausea And Vomiting    FAMILY HISTORY: Family History  Problem Relation Age of Onset   Colon cancer Neg Hx     SOCIAL HISTORY: Social  History   Socioeconomic History   Marital status: Married    Spouse name: Not on file   Number of children: Not on file   Years of education: Not on file   Highest education level: Not on file  Occupational History   Not on file  Tobacco Use   Smoking status: Former    Current packs/day: 0.00    Average packs/day: 0.1 packs/day for 19.0 years (1.9 ttl pk-yrs)    Types: Cigarettes    Start date: 9    Quit date: 04/19/1978    Years since quitting: 45.0   Smokeless tobacco: Never   Tobacco comments:    reports smoked socially  Vaping Use   Vaping status: Never Used  Substance and Sexual Activity   Alcohol use: Yes    Comment: occasional wine   Drug use: No   Sexual activity: Not on file  Other Topics Concern   Not on file  Social History Narrative   Right handed    Lives with her husband    Social Drivers of Corporate investment banker Strain: Not on file  Food Insecurity: Not on file  Transportation Needs:  Not on file  Physical Activity: Not on file  Stress: Not on file  Social Connections: Not on file  Intimate Partner Violence: Not on file     PHYSICAL EXAM: Vitals:   05/06/23 0944  BP: (!) 141/76  Pulse: 78  SpO2: 96%   General: No acute distress Head:  Normocephalic/atraumatic Skin/Extremities: No rash, no edema Neurological Exam: alert and awake. No aphasia or dysarthria. Fund of knowledge is appropriate. Attention and concentration are normal.   Cranial nerves: Pupils equal, round. Extraocular movements intact with no nystagmus. Visual fields full.  No facial asymmetry.  Motor: Bulk and tone normal, muscle strength 5/5 throughout with no pronator drift. Reflexes +2 throughout.  Finger to nose testing intact.  Gait narrow-based and steady, no ataxia. Romberg negative.   IMPRESSION: This is a pleasant 78 yo RH woman with a history of hypertension, complex partial seizures, seizure-free since 2000 on carbamazepine ER 200mg  BID, who had 2 brief episodes of left arm/hand weakness/heaviness, concerning for TIA. No further left-sided symptoms since August 2021. MRI brain/MRA head and neck no acute changes, no flow-limiting stenosis. She is not on aspirin after discussion with her cardiologist, continue control of vascular risk factors. Refills sent for carbamazepine ER. Right CP angle mass stable from 2021-2024, we discussed repeat imaging will be in 5 years unless any change in symptoms. She is aware of [place] driving laws to stop driving after a seizure until 6 months seizure-free. Follow-up in 1 year, call for any changes.    Thank you for allowing me to participate in her care.  Please do not hesitate to call for any questions or concerns.    Patrcia Dolly, M.D.   CC: Dr. Sherene Sires

## 2023-05-09 ENCOUNTER — Other Ambulatory Visit: Payer: Self-pay | Admitting: Internal Medicine

## 2023-05-19 ENCOUNTER — Telehealth: Payer: Self-pay | Admitting: Internal Medicine

## 2023-05-19 NOTE — Telephone Encounter (Signed)
Called and spoke with patient she said that she has been running a fever  that she could get it show up, she said that she was sore throat , headaches,. I told patient given he symptom she will need follow up with her pcp for treatment  Pt said that she will call them for appt .

## 2023-06-02 ENCOUNTER — Ambulatory Visit (HOSPITAL_BASED_OUTPATIENT_CLINIC_OR_DEPARTMENT_OTHER): Payer: Self-pay | Admitting: Internal Medicine

## 2023-06-02 NOTE — Telephone Encounter (Signed)
Pulmonary "Chief Complaint (e.g., cough, sob, wheezing, fever, chills, sweat or additional symptoms) *Go to specific symptom protocol after initial questions. Cough, same kind of cough I get after every virus, controlled fairly well during the day with nebulizer and prescribed meds, promethazine cough med but keeps me awake rather than put me to sleep, already had zpak and other meds, tramadol, cough for 3 weeks, problem is coughing at night just cannot sleep, saw PCP, no more SOB than normal, wheezing but taking nebulizer and ventolin inhaler, antihistamine pills, normally have prednisone but been making me sick, was told by doc probably need an injection next time, pharmacist said promethazine should knock me out but it doesn't, coughing up not much but clear sputum "How long have symptoms been present?" 3 weeks Have you tested for COVID or Flu? Note: If not, ask patient if a home test can be taken. If so, instruct patient to call back for positive results. Yes, all negative MEDICINES:   "Have you used any OTC meds to help with symptoms?" yes If yes, ask "What medications?" chlortramaton 4 mg q4h "Have you used your inhalers/maintenance medication?" yes If yes, "What medications?"ventolin inhaler, nebulizer albuterol and ipopropium sulfate 5 days of tramadol, z-pak If inhaler, ask "How many puffs and how often?" Note: Review instructions on medication in the chart. 2 puffs 2-3x a day OXYGEN: "Do you wear supplemental oxygen?" no "Do you monitor your oxygen levels?" yes If yes, "What is your reading (oxygen level) today?" Last night 95% "What is your usual oxygen saturation reading?"  (Note: Pulmonary O2 sats should be 90% or greater) Usually about 98%            Chief Complaint: cough Symptoms: cough productive to some clear sputum, coughing worse at night, wheezing, SOB not worse than usual, interrupted sleep,  Frequency: intermittent Pertinent Negatives: Patient denies coughing up  blood, more SOB than normal, fever, chest pain Disposition: [] 911 / [] ED /[] Urgent Care (no appt availability in office) / [x] Appointment(In office/virtual)/ []  [facility]/ [] Home Care/ [] Refused Recommended Disposition /[] [facility]/ []  Follow-up with PCP Additional Notes: Pt reporting she has been sick for 3 weeks and has lingering cough with wheezing that she confirms aligns with her usual course of URI, pt reporting that she is unable to take prednisone now and that pulm doc had told her "next time need to come in and get injection" rather than prednisone due to "sick" she gets with prednisone, also promethazine she's been prescribed is keeping her awake at night along with the coughing. Pt reporting she is utilizing all of her maintenance meds as prescribed as well as her rescue ventolin inhaler at 2 puffs 2-3x/day as prescribed. Pt confirms her usual O2 sat is 98% but last night was 95%. Pt requesting appt for injection per pulm doc's recommendations. Advised appt today, pt agreed, warm transferred to Doc Line to request appt, pt speaking with Doc Line now.   Reason for Disposition  Wheezing is present  Answer Assessment - Initial Assessment Questions 2. SEVERITY: "How bad is the cough today?"      Gets bad at like 8 pm worst at 12 am to 3 am 3. SPUTUM: "Describe the color of your sputum" (none, dry cough; clear, white, yellow, green)     Clear, not much 4. HEMOPTYSIS: "Are you coughing up any blood?" If so ask: "How much?" (flecks, streaks, tablespoons, etc.)     denies 5. DIFFICULTY BREATHING: "Are you having difficulty breathing?" If  Yes, ask: "How bad is it?" (e.g., mild, moderate, severe)    - MILD: No SOB at rest, mild SOB with walking, speaks normally in sentences, can lie down, no retractions, pulse < 100.    - MODERATE: SOB at rest, SOB with minimal exertion and prefers to sit, cannot lie down flat, speaks in phrases, mild retractions, audible wheezing, pulse  100-120.    - SEVERE: Very SOB at rest, speaks in single words, struggling to breathe, sitting hunched forward, retractions, pulse > 120      Denies, not able to lie flat to sleep but don't anyway, wheezing, confirms more out of breath with walking but been problem for a long time been evaluated, have that taken care of 6. FEVER: "Do you have a fever?" If Yes, ask: "What is your temperature, how was it measured, and when did it start?"     denies 7. CARDIAC HISTORY: "Do you have any history of heart disease?" (e.g., heart attack, congestive heart failure)      HTN 8. LUNG HISTORY: "Do you have any history of lung disease?"  (e.g., pulmonary embolus, asthma, emphysema)     asthma 9. PE RISK FACTORS: "Do you have a history of blood clots?" (or: recent major surgery, recent prolonged travel, bedridden)     denies 10. OTHER SYMPTOMS: "Do you have any other symptoms?" (e.g., runny nose, wheezing, chest pain)       Runny nose, no chest pain, wheezing  Protocols used: Cough - Acute Productive-A-AH

## 2023-06-03 ENCOUNTER — Ambulatory Visit: Payer: Medicare Other | Admitting: Internal Medicine

## 2023-06-03 ENCOUNTER — Encounter: Payer: Self-pay | Admitting: Internal Medicine

## 2023-06-03 VITALS — BP 136/80 | HR 80 | Ht 60.0 in | Wt 152.0 lb

## 2023-06-03 DIAGNOSIS — J45991 Cough variant asthma: Secondary | ICD-10-CM | POA: Diagnosis not present

## 2023-06-03 MED ORDER — METHYLPREDNISOLONE ACETATE 80 MG/ML IJ SUSP
120.0000 mg | Freq: Once | INTRAMUSCULAR | Status: AC
  Administered 2023-06-03: 120 mg via INTRAMUSCULAR

## 2023-06-03 MED ORDER — BUDESONIDE-FORMOTEROL FUMARATE 80-4.5 MCG/ACT IN AERO: INHALATION_SPRAY | RESPIRATORY_TRACT | 12 refills | Status: AC

## 2023-06-03 MED ORDER — TRAMADOL HCL 50 MG PO TABS: 50.0000 mg | ORAL_TABLET | ORAL | 0 refills | Status: AC | PRN

## 2023-06-03 NOTE — Progress Notes (Signed)
Subjective:     Patient ID: Allison Gross, female   DOB: 14-Apr-1946   MRN: [id number]   Brief patient profile:  24   yowf quit light smoking 1973 with intermittent sinus/ bronchitis but no chronic c/o's or need for rx not much better after stopped smoking then much worse after around 2000 despite daily maint rx for asthma. Previously found to be allergic to dust, cats and trees and mold but no seasonal pattern and did not feel allergy shots helped in 1990s      History of Present Illness  09/21/2013 Acute OV  Complains of hoarseness, some increased SOB, low grade temp, dry hacking cough with occasional yellow mucus, some tightness x 1 week.  She denies any calf pain, edema, hemoptysis, orthopnea, PND, nausea, vomiting, diarrhea. Does feel, that she's had a low-grade fever. Returned yesterday from a 2 week cruise.  Says many people on the cruise were sick with cough and similar symptoms. Was in [employer].  Finished zpak on 6/3 and pred pak today by ships' physician. Has been using tramadol to today for cough with minimal help rec Levaquin 500mg  daily for 7 days  Prednisone taper over next week.  Delsym 2 tsp Twice daily   For cough  Tramadol 50mg  1 every 4hr as needed for breakthrough cough.  Mucinex Twice daily  As needed  Congestion . Saline nasal rinses As needed   Continue on Chlortrimeton 4mg  2 tabs At bedtime       Cough started a few days prior to Apr 08 2022 POS COVID  testing and the "worse ever since"   05/05/2021  f/u ov/[name] re: cough variant asthma   maint on symb 80 2bid / ppi ac  H1 did not help the noct cough  Chief Complaint  Patient presents with   Follow-up    Cough is better but has not resolved completely. She has been using neb 2 x daily.   Best rx sipping warm water Dyspnea: better  Cough: better x 4-5 days but still nocturnla worse despite 1st gen H1 blockers per guidelines   Sleeping: flat bed with pillow  SABA use: not sure  02: none  Covid  status:   vax x 4  Rec Take delsym two tsp every 12 hours and supplement if needed with  tramadol 50 mg up to 1-2 every 4 hours  Once you have eliminated the cough for 3 straight days try reducing the tramadol first,  then the delsym as tolerated.   When cough flares > protonix  40 mg Take 30- 60 min before your first and last meals of the day until better week and no need for cough medication for a week. Please schedule a follow up visit in 3 months but call sooner if needed    01/20/2023  ACUTE ov/[name] re: asthma with recurrent cough  maint on symbicort 160 / ppi bid ac   Chief Complaint  Patient presents with   Acute Visit    Cough and wheeze x 3 weeks  Dyspnea:  no more doe than usual  despite flair of cough  Cough: 80% better . Min mucoid production- never able to suppress urge to cough but only took a few tramadol per day at most  Sleeping: bed blocks or sitting up does better  but only used  one  h1 at hs  SABA use: not using now  Rec Depomedrol 120 mg IM  For drainage / throat tickle try take CHLORPHENIRAMINE  4  mg   Take delsym two tsp every 12 hours and supplement if needed with  tramadol 50 mg up to 2 every 4 hours Once you have eliminated the cough for 3 straight days try reducing the tramadol first,  then the delsym as tolerated.   Please schedule a follow up visit in 6 months but call sooner if needed      06/03/2023  ACUTE  ov/[facility]/[name] re: recurrent severe cough x 3 weeks assoc with uri neg viral profile rx zpak   then tramadol 50 mg 1 every 4 hours  on symbicort 80  Chief Complaint  Patient presents with   Acute Visit   Cough   Shortness of Breath  Dyspnea:  none  Cough: non productive, worse after supper usually around 9 pm p supper 7 dry hack, jolly ranchers help Sleeping: poorly sitting up  almost 90 degrees > cough wrose at lower levels    SABA use: neb and ventolin hfa with the former helping more  02: none     No obvious day to day or daytime  variability or assoc excess/ purulent sputum or mucus plugs or hemoptysis or cp or chest tightness, subjective wheeze or overt sinus or hb symptoms.    Also denies any obvious fluctuation of symptoms with weather or environmental changes or other aggravating or alleviating factors except as outlined above   No unusual exposure hx or h/o childhood pna/ asthma or knowledge of premature birth.  Current Allergies, Complete Past Medical History, Past Surgical History, Family History, and Social History were reviewed in [employer] record.  ROS  The following are not active complaints unless bolded Hoarseness, sore throat, dysphagia, dental problems, itching, sneezing,  nasal congestion or discharge of excess mucus or purulent secretions, ear ache,   fever, chills, sweats, unintended wt loss or wt gain, classically pleuritic or exertional cp,  orthopnea pnd or arm/hand swelling  or leg swelling, presyncope, palpitations, abdominal pain, anorexia, nausea, vomiting, diarrhea  or change in bowel habits or change in bladder habits, change in stools or change in urine, dysuria, hematuria,  rash, arthralgias, visual complaints, headache, numbness, weakness or ataxia or problems with walking or coordination,  change in mood or  memory.        Current Meds  Medication Sig   albuterol (PROAIR HFA) 108 (90 Base) MCG/ACT inhaler INHALE 2 PUFFS EVERY 4 HOURS AS NEEDED FOR WHEEZING AND SHORTNESS OF BREATH.   BIOTIN PO Take 1 capsule by mouth daily.   budesonide-formoterol (SYMBICORT) 160-4.5 MCG/ACT inhaler INHALE TWO PUFFS TWICE A DAY.   calcium-vitamin D (OSCAL WITH D) 500-200 MG-UNIT per tablet Take 1 tablet by mouth daily with breakfast.   carbamazepine (CARBATROL) 200 MG 12 hr capsule Take 1 capsule (200 mg total) by mouth 2 (two) times daily.   chlorpheniramine (CHLOR-TRIMETON) 4 MG tablet Take 4 mg by mouth 2 (two) times daily as needed for allergies.   fexofenadine (ALLEGRA) 180 MG  tablet Take 180 mg by mouth every morning.   ipratropium-albuterol (DUONEB) 0.5-2.5 (3) MG/3ML SOLN Take 3 mLs by nebulization.   losartan-hydrochlorothiazide (HYZAAR) 50-12.5 MG per tablet Take 1 tablet by mouth every morning.    Melatonin 10 MG TABS Take 10 mg by mouth at bedtime.   Multiple Vitamin (MULTIVITAMIN) tablet Take 1 tablet by mouth every morning.   pantoprazole (PROTONIX) 40 MG tablet TAKE 1 TABLET BY MOUTH TWICE A DAY BEFORE A MEAL-TAKE 30-60 MINUTES BEFORE YOUR FIRST AND LAST MEALS OF  THE DAY   potassium chloride (MICRO-K) 10 MEQ CR capsule Take 10 mEq by mouth daily.   Respiratory Therapy Supplies (FLUTTER) DEVI As instructed   Turmeric 500 MG CAPS Take 1 capsule by mouth every morning.    vitamin C (ASCORBIC ACID) 500 MG tablet Take 500 mg by mouth every morning.                   Past Medical History:  Asthma  - HFA 50% November 10, 2009 > 75% December 24, 2009 > 90% March 26, 2010  Osteopenia  - hold fosfamax 09/2009 >> better with active HB so rec Recl[facility] rx         Objective:  Physical Exam  Wts  06/03/2023   152  01/20/2023   149   06/21/2022     155   03/17/2022  155 05/05/2021   153  10/25/2018    158  02/21/2018    155  07/14/2012    158 = baseline /   11/13/2014 162 > 02/14/2015   159 >  06/09/2015  157 > 11/13/2015  156 > 05/17/2016  161 > 08/02/2016  161 >  12/16/2016  158 > 06/13/2017  160 > 12/14/2017  95%   Vital signs reviewed  06/03/2023  - Note at rest 02 sats  92% on RA   General appearance:    amb wf nad   HEENT : Oropharynx  clear      Nasal turbinates nl    NECK :  without  apparent JVD/ palpable Nodes/TM /mild pseudowheeze with freq throat clearing   LUNGS: no acc muscle use,  Mod kyphotic contour chest which is clear to A and P bilaterally without cough on insp or exp maneuvers   CV:  RRR  no s3 or murmur or increase in P2, and no edema   ABD:  soft and nontender   MS:  Gait nl   ext warm without deformities Or obvious joint restrictions   calf tenderness, cyanosis or clubbing    SKIN: warm and dry without lesions    NEURO:  alert, approp, nl sensorium with  no motor or cerebellar deficits apparent.            Assessment:

## 2023-06-03 NOTE — Patient Instructions (Addendum)
Change symbicort to 80 Take 2 puffs first thing in am and then another 2 puffs about 12 hours later.    Work on inhaler technique:  relax and gently blow all the way out then take a nice smooth full deep breath back in, triggering the inhaler at same time you start breathing in.  Hold breath in for at least  5 seconds if you can. Blow out symbicort  thru nose. Rinse and gargle with water when done.  If mouth or throat bother you at all,  try brushing teeth/gums/tongue with arm and hammer toothpaste/ make a slurry and gargle and spit out.   When coughing > use nebulizer version of the albuterol instead of the spray  Take delsym two tsp every 12 hours and supplement if needed with  tramadol 50 mg up to 2 every 4 hours to suppress the urge to cough. Swallowing water and/or using ice chips/non mint and menthol containing candies (such as lifesavers or sugarless jolly ranchers) are also effective.  You should rest your voice and avoid activities that you know make you cough.  Once you have eliminated the cough for 3 straight days try reducing the tramadol first,  then the delsym as tolerated.    Depomedrol 120 mg IM    Follow up is as needed

## 2023-06-04 NOTE — Assessment & Plan Note (Addendum)
Onset around 2000  - HFA 90% p coaching 04/23/11 - c/o hoarseness > added spacer 12/01/2012  > improved but stopped using it  - maint on symbicort 80 2bid  - try taper off gerd rx 11/13/14 > flared end of August 2016  - NO 05/21/2015  = 5  - Spirometry 05/17/2016  Nl p am symbicort 80 x 2 and doe reported   - Spirometry 06/13/2017  FEV1 1.34 (68%)  Ratio 73 p am symb 80   X 2   - 03/17/2022 changed to  dulera to 200 bid   p reported increased need for saba x several months -  06/21/2022  After extensive coaching inhaler device,  effectiveness =  90%> continue symbicort 160  -  06/03/2023 changed to symb 80 p flare on 160 end of jan 2025 > repeat cyclical cough rx with tramadol /depomedrol  120   Of the three most common causes of  Sub-acute / recurrent or chronic cough, only one (GERD)  can actually contribute to/ trigger  the other two (asthma and post nasal drip syndrome)  and perpetuate the cylce of cough.  While not intuitively obvious, many patients with chronic low grade reflux do not cough until there is a primary insult that disturbs the protective epithelial barrier and exposes sensitive nerve endings.   This is typically viral but can due to PNDS and  either may apply here.     >>>The point is that once this occurs, it is difficult to eliminate the cycle  using anything but a maximally effective acid suppression regimen at least in the short run, accompanied by an appropriate diet to address non acid GERD. Continue 1st gen H1 blockers per guidelines  to eliminate pnds  and control / eliminate the cough itself for at least 3 days with tramadol plus depomedrol 120 mg IM  in case of component of Th-2 driven upper or lower airways inflammation (if cough responds short term only to relapse before return while will on full rx for uacs (as above), then that would point to allergic rhinitis/ asthma or eos bronchitis as alternative dx)   Using the lower strength symbicort due to obvious component of UACS  where any high dose ICS or even excess hfa can trigger coughing, so advised to use the minimal symb plus neb alb during flares.  Each maintenance medication was reviewed in detail including emphasizing most importantly the difference between maintenance and prns and under what circumstances the prns are to be triggered using an action plan format where appropriate.  Total time for H and P, chart review, counseling, reviewing hfa device(s) and generating customized AVS unique to this ACUTE office visit / same day charting  > 30 min for multiple  refractory respiratory  symptoms of uncertain etiology

## 2023-09-21 IMAGING — DX DG CHEST 2V
2 series · 2 of 2 positions shown · non-contrast
Comparison: 09/23/2020

CLINICAL DATA: asthmatic bronchitis

EXAM:
CHEST - 2 VIEW

[chest pa]
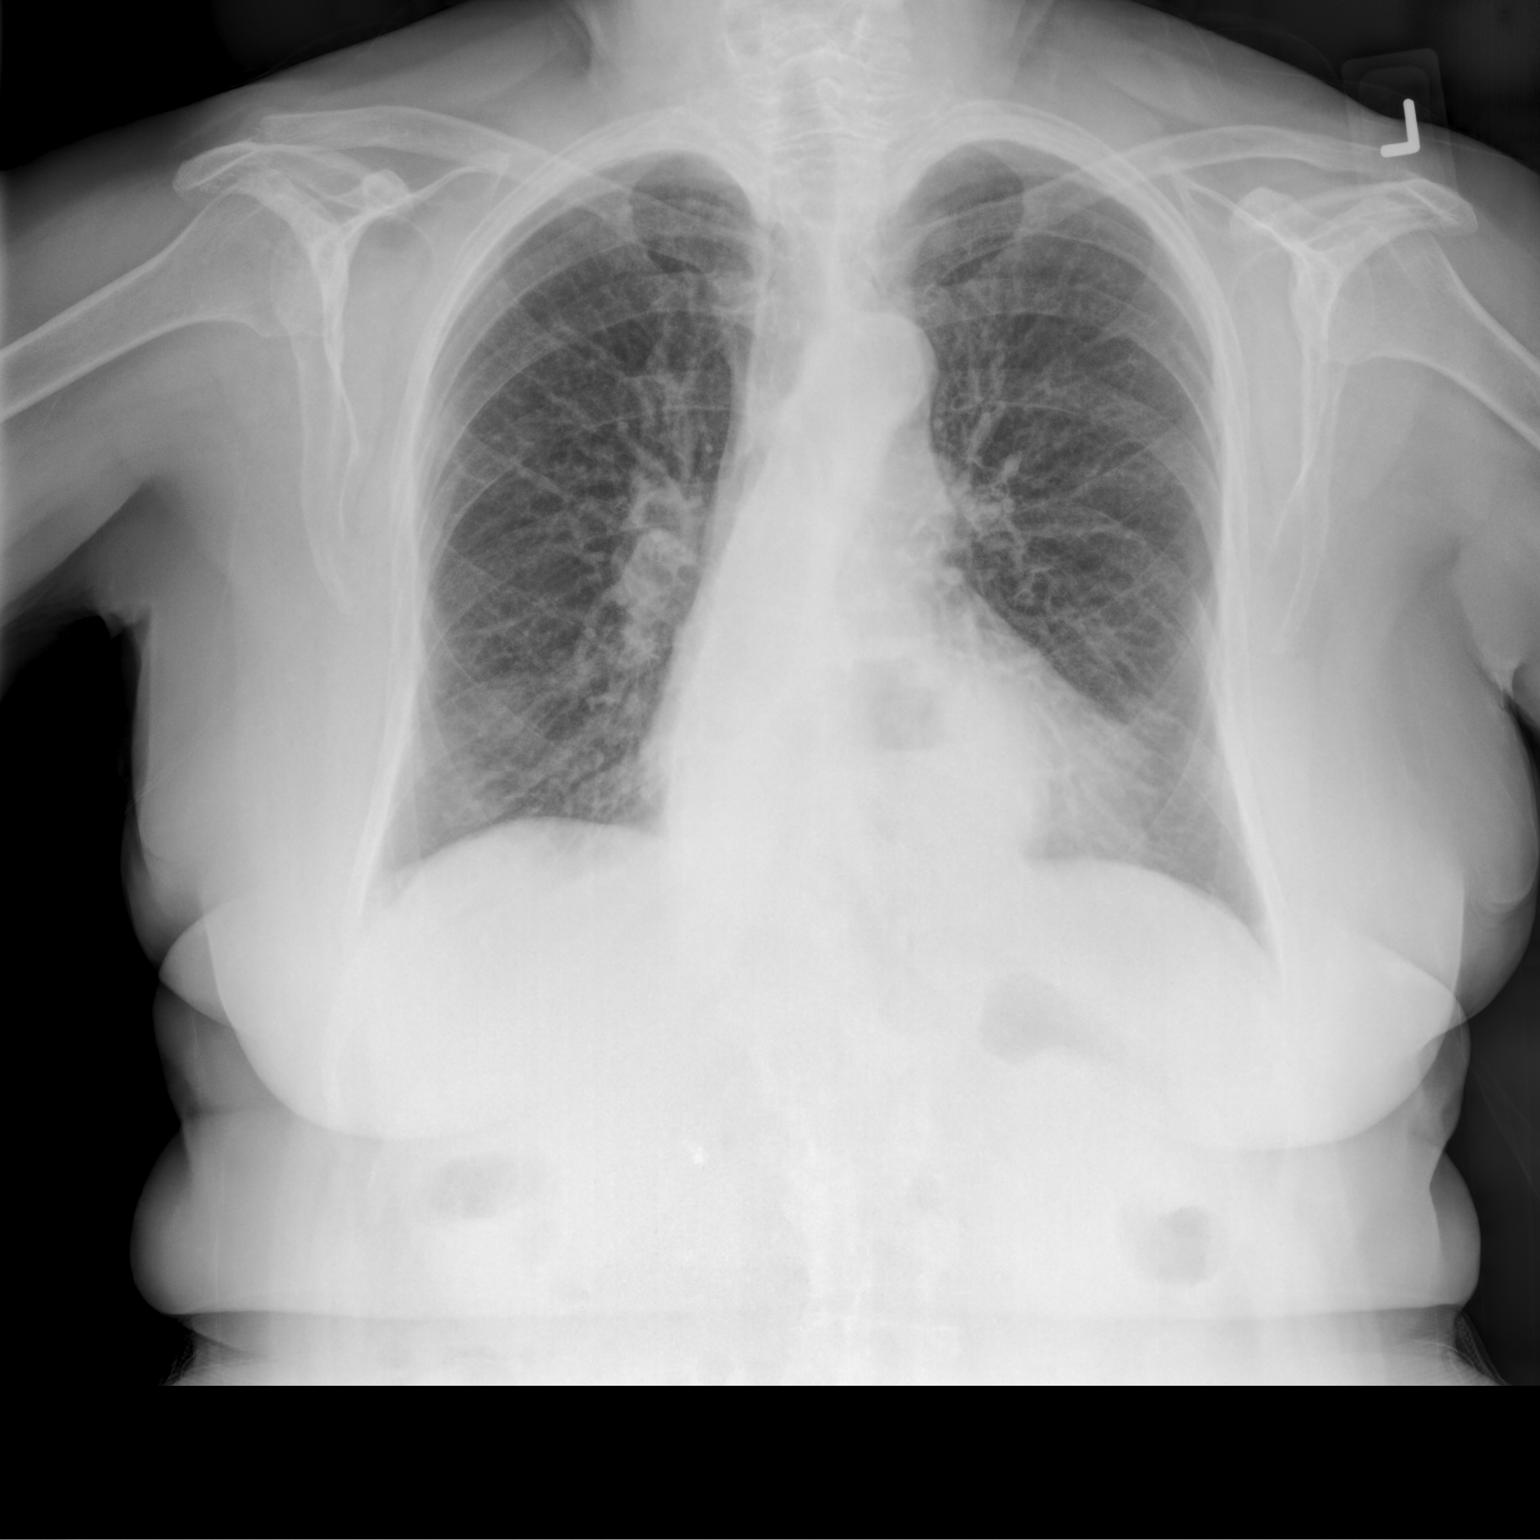

[chest lat]
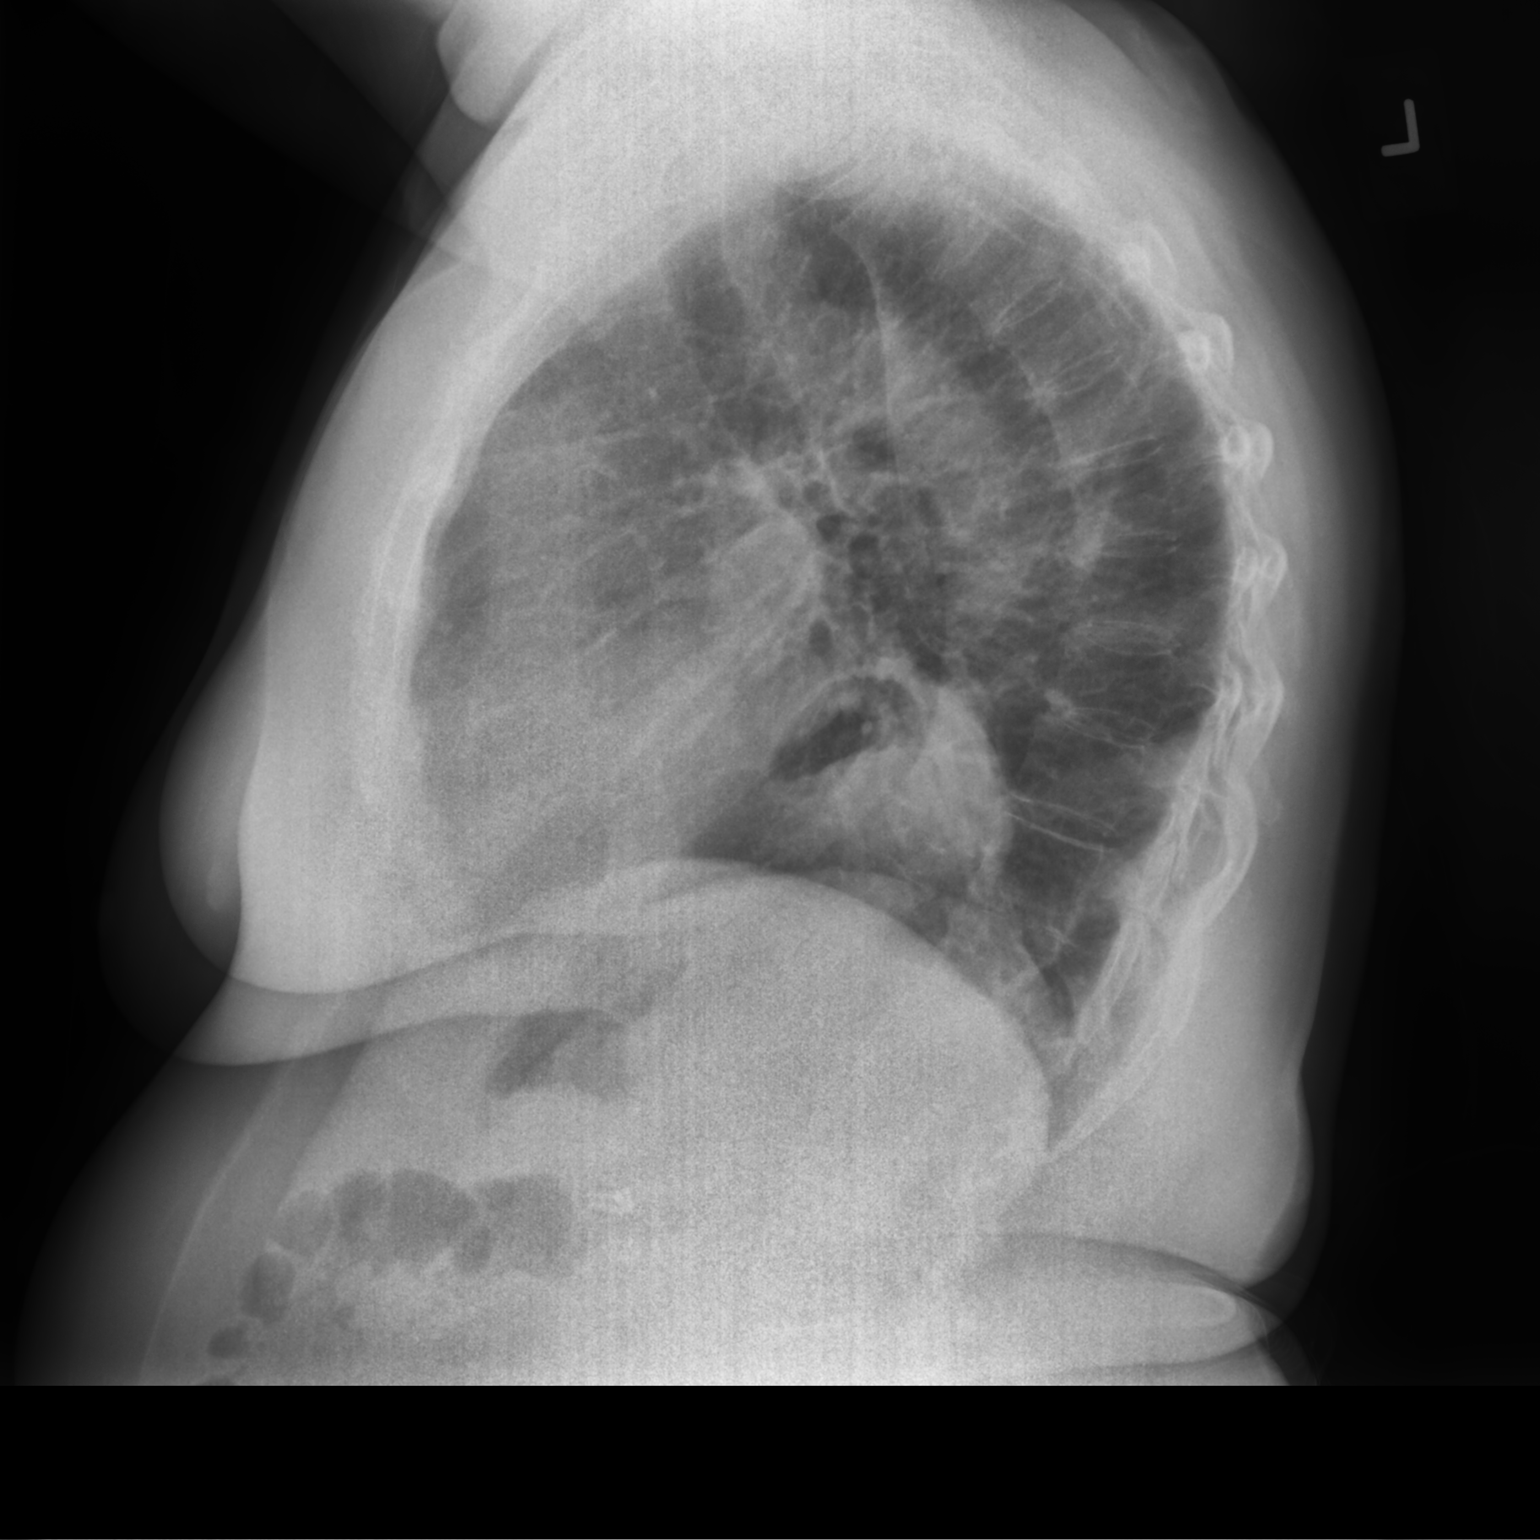

[2 of 2 positions shown; findings below may reference images not displayed]

FINDINGS: Stable heart size. Moderate hiatal hernia. Atherosclerotic
calcification of the aortic knob. Chronically coarsened interstitial
markings bilaterally. No focal airspace consolidation. No pleural
effusion or pneumothorax. Exaggerated thoracic kyphosis.
IMPRESSION: Chronically coarsened interstitial markings suggest bronchitic type
lung changes.

## 2023-10-13 IMAGING — DX DG CHEST 2V
2 series · 2 of 2 positions shown · non-contrast
Comparison: 04/06/2021

CLINICAL DATA: 75-year-old female with cough and COVID

EXAM:
CHEST - 2 VIEW

[chest pa]
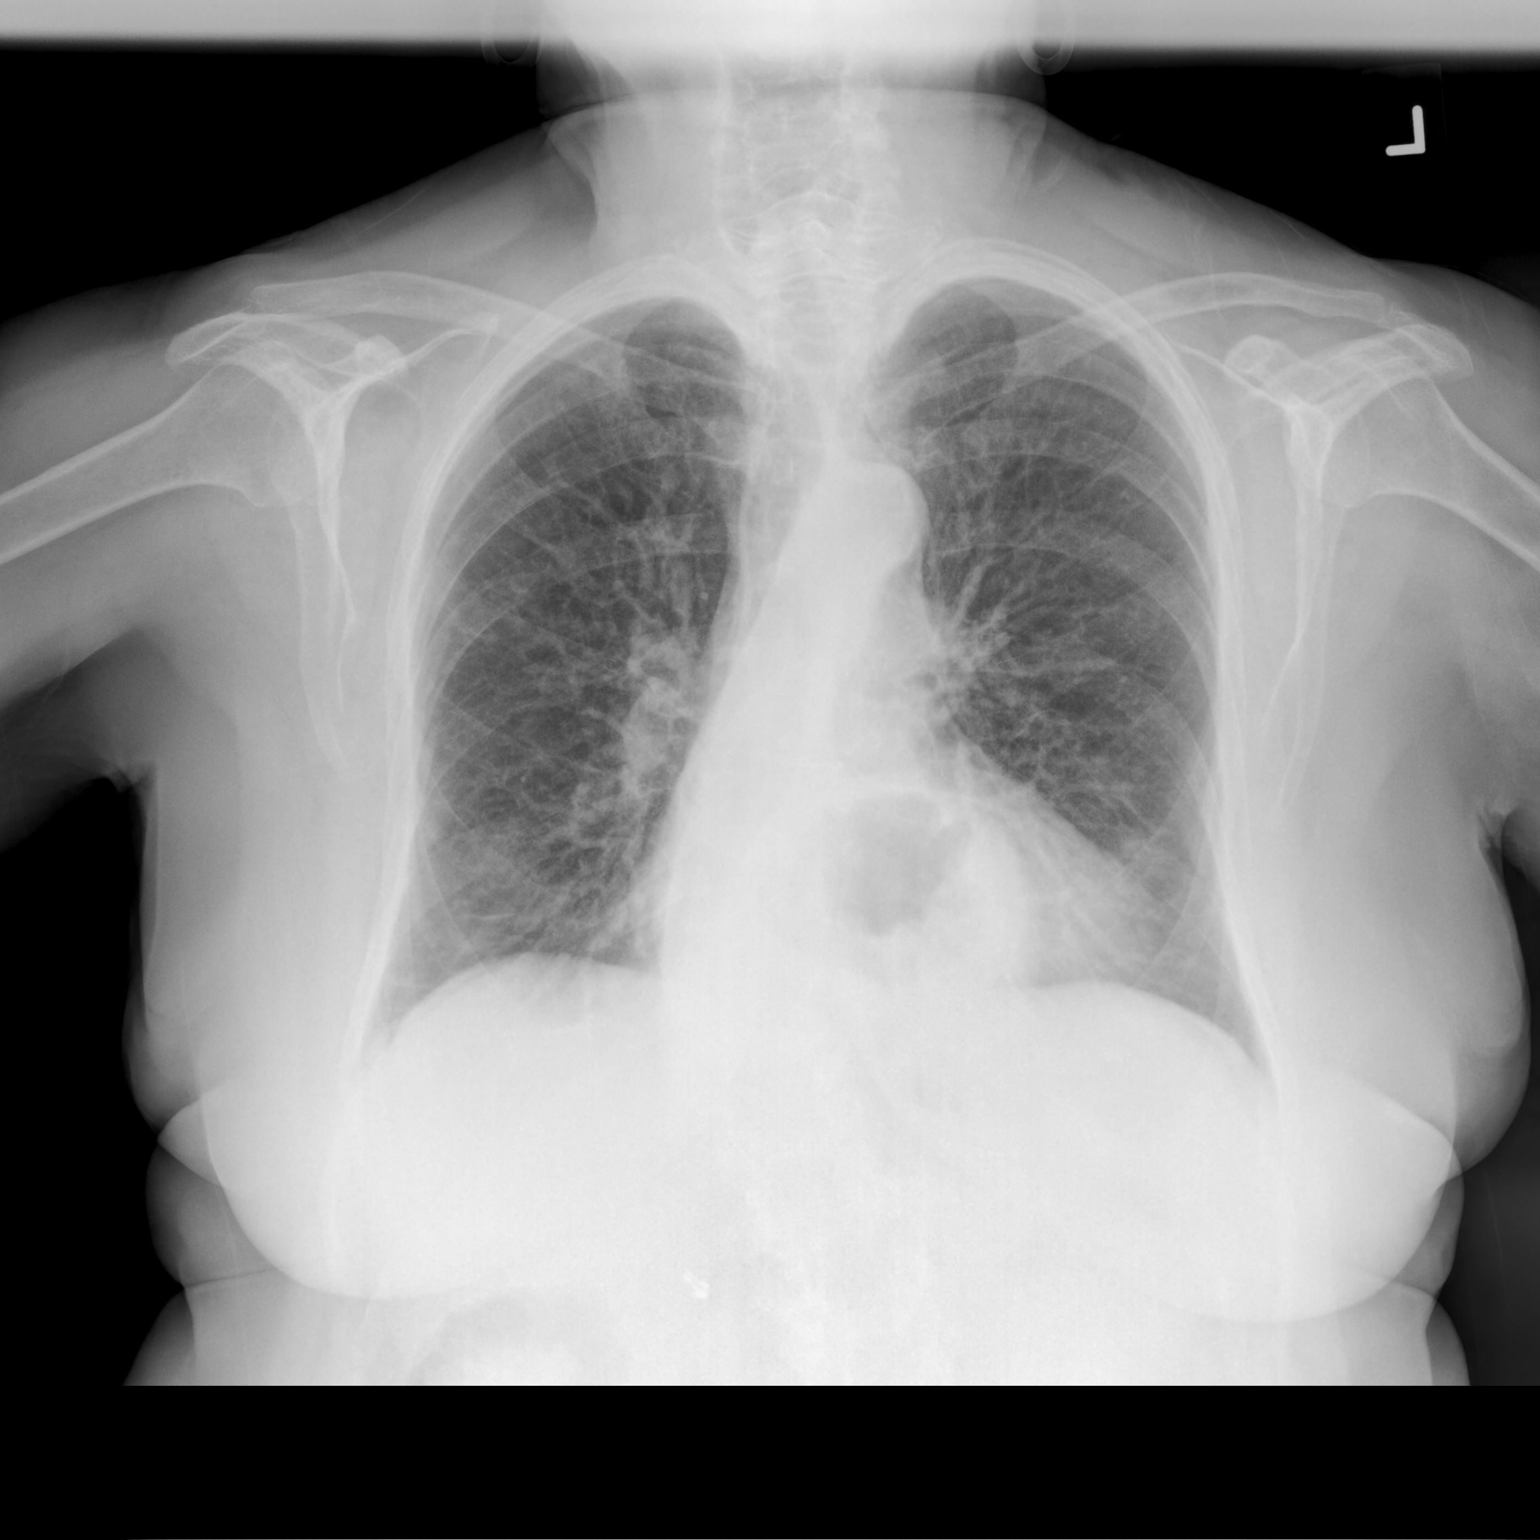

[chest lat]
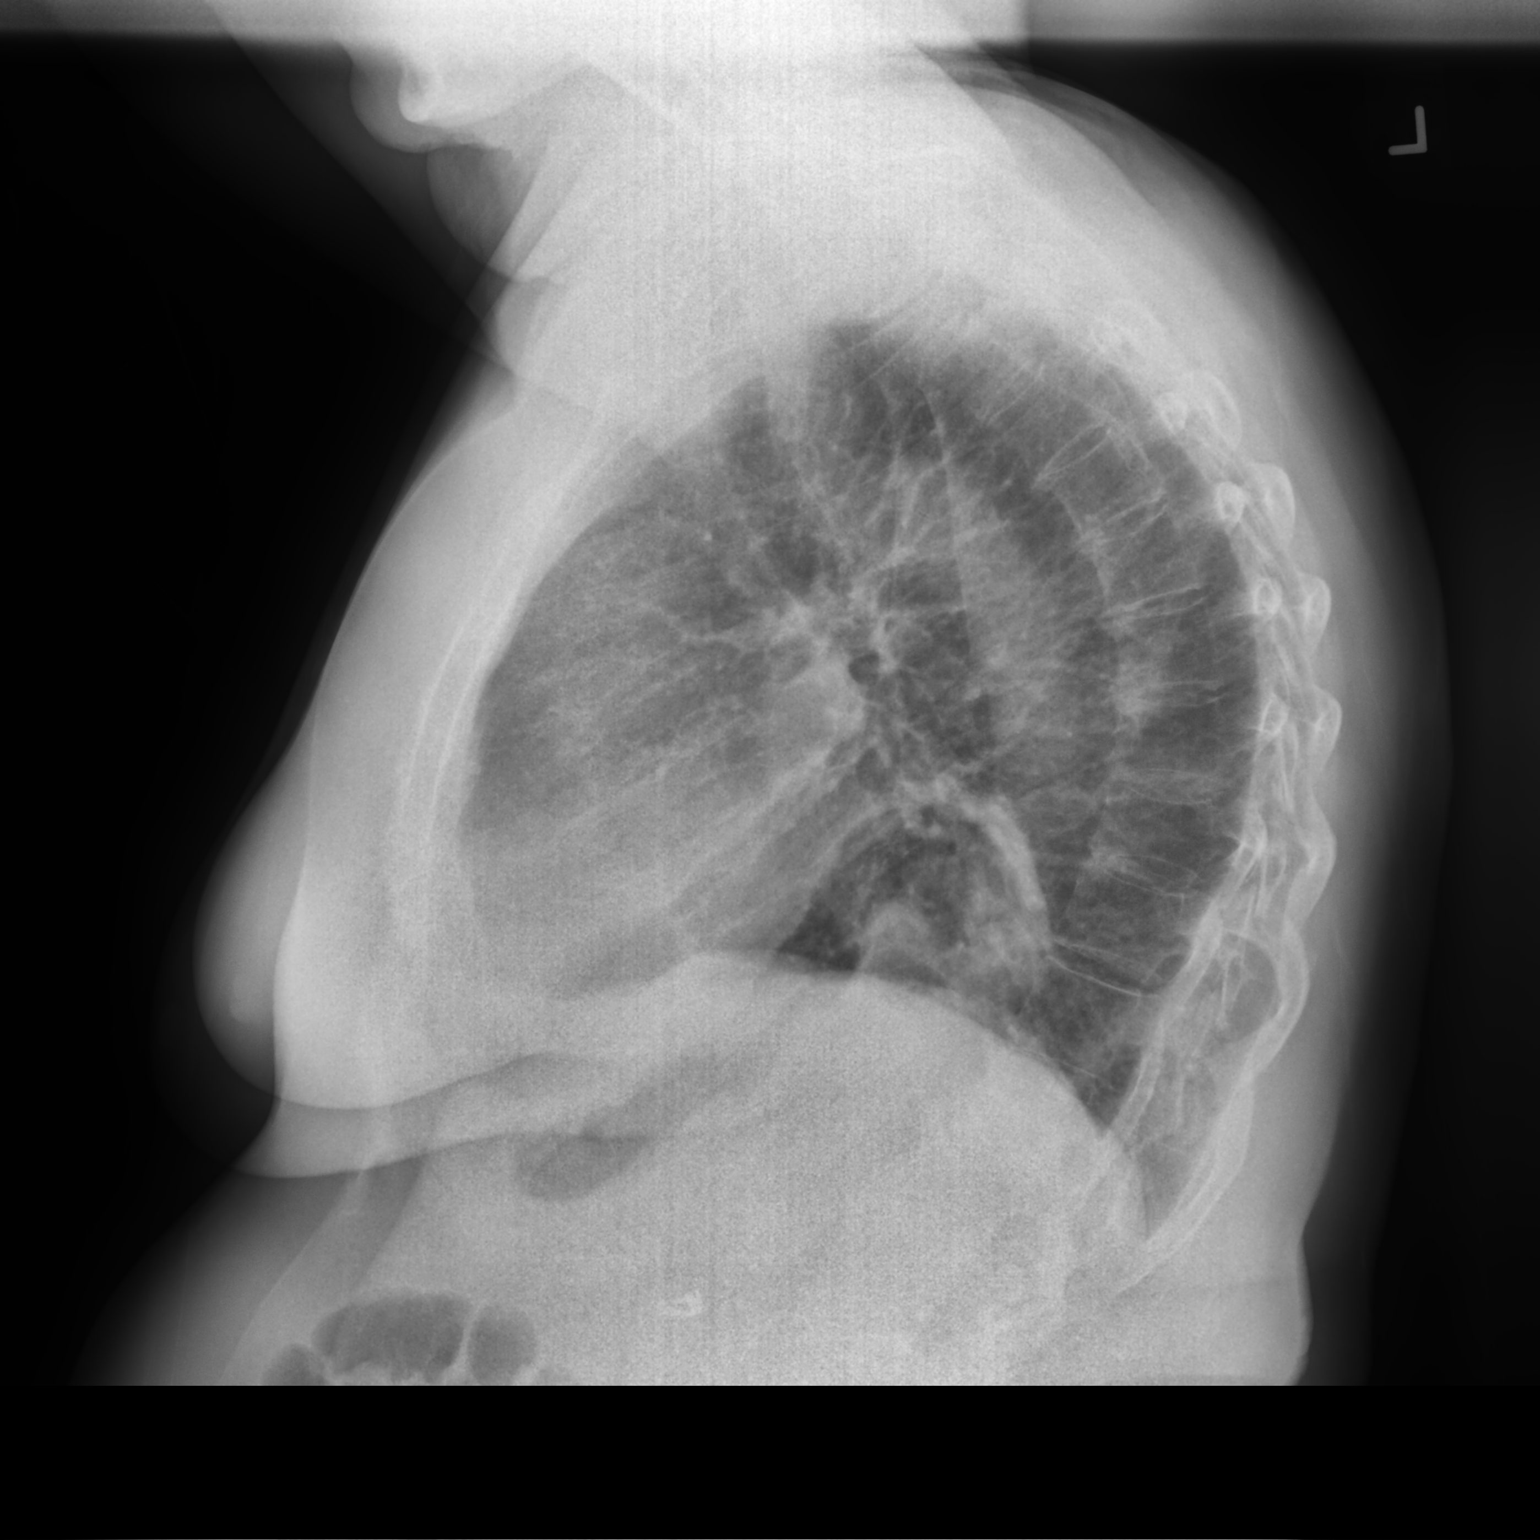

[2 of 2 positions shown; findings below may reference images not displayed]

FINDINGS: Cardiomediastinal silhouette unchanged in size and contour. Double
density projects over the lower mediastinum unchanged.

No pneumothorax or pleural effusion. No interlobular septal
thickening.

Coarsened interstitial markings throughout, without confluent
airspace disease. Overall similar appearance to the comparison.

Degenerative changes of the spine.
IMPRESSION: Chronic lung changes without evidence of acute cardiopulmonary
disease

## 2023-11-02 ENCOUNTER — Other Ambulatory Visit: Payer: Self-pay | Admitting: Internal Medicine

## 2023-11-23 ENCOUNTER — Other Ambulatory Visit: Payer: Self-pay | Admitting: Internal Medicine

## 2023-11-23 MED ORDER — ALBUTEROL SULFATE HFA 108 (90 BASE) MCG/ACT IN AERS: INHALATION_SPRAY | RESPIRATORY_TRACT | 8 refills | Status: AC

## 2023-11-23 NOTE — Telephone Encounter (Signed)
 Copied from CRM #[id number]. Topic: Clinical - Medication Refill >> Nov 23, 2023  9:20 AM Leila BROCKS wrote: Most Recent Pulmonary Care Visit:  Provider: Dr. Ozell America  Department: [facility] Date: 06/03/23  Medication(s):  Albuterol  sulfate (Ventolin  HFA) 108 (90 Base) MCG/ACT inhaler    Has the patient contacted their pharmacy? Yes, the pharmacy has reached out to the office twice with no response. Patient has been using more of the inhaler, which patient spoke with Dr. America on this matter. Patient asking for more refills due the hassle to call in and not getting authorization.  (Agent: If no, request that the patient contact the pharmacy for the refill. If patient does not wish to contact the pharmacy document the reason why and proceed with request.) (Agent: If yes, when and what did the pharmacy advise?)  This is the patient's preferred pharmacy:  [facility] - [place], [place] - [address] [place] [address] Phone: [phone] Fax: [phone]  Is this the correct pharmacy for this prescription? Yes If no, delete pharmacy and type the correct one.   Has the prescription been filled recently? No  Is the patient out of the medication? No, 8 puffs left  Has the patient been seen for an appointment in the last year OR does the patient have an upcoming appointment? Yes  Can we respond through MyChart? No  Agent: Please be advised that Rx refills may take up to 3 business days. We ask that you follow-up with your pharmacy.

## 2024-02-28 ENCOUNTER — Encounter: Payer: Self-pay | Admitting: Neurology

## 2024-05-07 ENCOUNTER — Ambulatory Visit: Payer: Medicare Other | Admitting: Neurology

## 2024-05-08 NOTE — Progress Notes (Unsigned)
 "  NEUROLOGY FOLLOW UP OFFICE NOTE  Allison Gross [id number] 1945/06/29  HISTORY OF PRESENT ILLNESS: I had the pleasure of seeing Allison Gross in follow-up in the neurology clinic on 05/09/2024.  The patient was last seen a year ago. She is alone in the office today. Records and images were personally reviewed where available. She has a history of well-controlled seizures on carbamazepine  ER 200mg  BID, seizure-free since 2000. She presented in 2021 after 2 transient episodes of left hand weakness and heaviness. MRI brain with and without contrast no acute changes. TIA workeup unremarkable, we had discussed starting aspirin but she reports speaking to her cardiologist who she states did not think she needed aspirin.    She continues to do well with no further episodes of left hand weakness. She denies any staring/unresponsive episodes, gaps in time, olfactory/gustatory hallucinations, focal numbness/tingling/weakness, myoclonic jerks. No headaches, dizziness, hearing loss, tinnitus, no falls. She is scheduled for laser eye surgery soon. The only thing she has noticed is she is slower to do things, such as their family Christmas dinner last month. 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 She usually gets 6-7 hours of sleep in the winter and takes a brief nap in the afternoon. Mood is pretty good. She lives with her husband and drives.    History on Initial Assessment 12/31/2019: This is a pleasant 79 year old right-handed woman with a history of hypertension, complex partial seizures, presenting for second opinion. She had been seeing neurologist Dr. Dagoberto since at least 2013, records were reviewed. She was last seen in January 2021 with note of well-controlled seizures since on carbamazepine  ER 200mg  BID. Her last carbamazepine  level in January 2021 was  8.5. She reports having brief passing out episodes at age 76. She had preeclampsia with her first child in 56 and had post-partum convulsions. 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 It was not noticeable to others, she just held on to the table. She had 2 or 3 of them and was seen by Neurology and told there was a lesion on the left side. She was started on carbamazepine . She reports the last seizure was in 2000 and at that time dose was increased to carbamazepine  ER 200mg  BID. She denies any side effects. Her last bone density scan done 8 years ago showed osteopenia, she was briefly on Fosamax, which was stopped by another provider. She denies any staring/unresponsive episodes, gaps in time, olfactory/gustatory hallucinations, myoclonic jerks. She denies any headaches, dizziness, diplopia, dysarthria/dysphagia, neck/back pain, bowel/bladder dysfunction. Her last fall was 2 months ago.   After she was referred and scheduled for this visit, she has also had 2 recent episodes that occurred a week apart at the end of August. The first episode occurred while she was holding the TV remote control with her left hand and she had weakness in her left hand and a heaviness in her left arm. It felt like it went out for a minute, no numbness/tingling/pain, then she felt fine. No associated headache, confusion. The second episode was at the end of August, she was typing on her computer then had the same sensation of weakness/heaviness in her left arm lasting 45-60 seconds. She also had one episode at night where there was  a burning/itchiness on her left hand radiating to her fingers that lasted for 10 minutes. Face and leg were not affected. She reports having normal cholesterol levels when checked by her cardiologist, LDL in the 30s. Last bloodwork was over a year ago. She reports having an echocardiogram 4-6 weeks ago,  reportedly normal.   Epilepsy Risk Factors:  She had a normal birth and early development.  There is no history of febrile convulsions, CNS infections such as meningitis/encephalitis, significant traumatic brain injury, neurosurgical procedures, or family history of seizures.  Diagnostic Data: MRI brain with and without contrast and MRA head and neck done 01/2020 did not show any acute changes, there was mild diffuse atrophy and chronic microvascular disease. 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   MRI brain with and without contrast 05/2022 stable right CP angle mass. PAST MEDICAL HISTORY: Past Medical History:  Diagnosis Date   Asthma    Cancer (HCC)    basil,squamous   Hypertension    Osteopenia    Seizures (HCC) 15 years ago   1982's from childbirth   Visit for wound care    lower leg   Wound cellulitis 03/09/2022    MEDICATIONS: Medications Ordered Prior to Encounter[1]  ALLERGIES: Allergies[2]  FAMILY HISTORY: Family History  Problem Relation Age of Onset   Colon cancer Neg Hx     SOCIAL HISTORY: Social History   Socioeconomic History   Marital status: Married    Spouse name: Not on file   Number of children: Not on file   Years of education: Not on file   Highest education level: Not on file  Occupational History   Not on file  Tobacco Use   Smoking status: Former    Current packs/day: 0.00    Average packs/day: 0.1 packs/day for 19.0 years (1.9 ttl pk-yrs)    Types: Cigarettes    Start date: 76    Quit date: 04/19/1978    Years since quitting: 46.0   Smokeless tobacco: Never   Tobacco comments:    reports smoked socially  Vaping Use   Vaping status: Never Used  Substance  and Sexual Activity   Alcohol use: Yes    Comment: occasional wine   Drug use: No   Sexual activity: Not on file  Other Topics Concern   Not on file  Social History Narrative   Right handed    Lives with her husband    Social Drivers of Health   Tobacco Use: Medium Risk (06/03/2023)   Patient History    Smoking Tobacco Use: Former    Smokeless Tobacco Use: Never    Passive Exposure: Not on Actuary Strain: Not on file  Food Insecurity: Not on file  Transportation Needs: Not on file  Physical Activity: Not on file  Stress: Not on file  Social Connections: Not on file  Intimate Partner Violence: Not on file  Depression (EYV7-0): Not on file  Alcohol Screen: Not on file  Housing: Unknown (05/20/2023)   Received from [facility]   Epic    Unable to Pay for Housing in the Last Year: Not on file    Number of Times Moved in the Last Year: Not  on file    At any time in the past 12 months, were you homeless or living in a shelter (including now)?: No  Utilities: Not on file  Health Literacy: Not on file     PHYSICAL EXAM: There were no vitals filed for this visit. General: No acute distress Head:  Normocephalic/atraumatic Skin/Extremities: No rash, no edema Neurological Exam: alert and oriented to person, place, and time. No aphasia or dysarthria. Fund of knowledge is appropriate.  Recent and remote memory are intact.  Attention and concentration are normal.   Cranial nerves: Pupils equal, round. Extraocular movements intact with no nystagmus. Visual fields full.  No facial asymmetry.  Motor: Bulk and tone normal, muscle strength 5/5 throughout with no pronator drift.   Finger to nose testing intact.  Gait narrow-based and steady, able to tandem walk adequately.  Romberg negative.   IMPRESSION: This is a pleasant 79 yo RH woman with a history of hypertension, complex partial seizures, seizure-free since 2000 on carbamazepine  ER 200mg  BID, who had  2 brief episodes of left arm/hand weakness/heaviness, concerning for TIA. No further left-sided symptoms since August 2021. MRI brain/MRA head and neck no acute changes, no flow-limiting stenosis. She is not on aspirin after discussion with her cardiologist, continue control of vascular risk factors. Refills sent for carbamazepine  ER. Right CP angle mass stable from 2021-2024, we discussed repeat imaging will be in 5 years unless any change in symptoms. She is aware of [place] driving laws to stop driving after a seizure until 6 months seizure-free. Follow-up in 1 year, call for any changes.     Thank you for allowing me to participate in *** care.  Please do not hesitate to call for any questions or concerns.  The duration of this appointment visit was *** minutes of face-to-face time with the patient.  Greater than 50% of this time was spent in counseling, explanation of diagnosis, planning of further management, and coordination of care.   Darice Shivers, M.D.   CC: ***        [1]  Current Outpatient Medications on File Prior to Visit  Medication Sig Dispense Refill   albuterol  (PROAIR  HFA) 108 (90 Base) MCG/ACT inhaler INHALE 2 PUFFS EVERY 4 HOURS AS NEEDED FOR WHEEZING AND SHORTNESS OF BREATH. 8.5 g 8   BIOTIN PO Take 1 capsule by mouth daily.     budesonide -formoterol  (SYMBICORT ) 80-4.5 MCG/ACT inhaler Take 2 puffs first thing in am and then another 2 puffs about 12 hours later. 1 each 12   calcium-vitamin D  (OSCAL WITH D) 500-200 MG-UNIT per tablet Take 1 tablet by mouth daily with breakfast.     carbamazepine  (CARBATROL ) 200 MG 12 hr capsule Take 1 capsule (200 mg total) by mouth 2 (two) times daily. 180 capsule 4   chlorpheniramine (CHLOR-TRIMETON) 4 MG tablet Take 4 mg by mouth 2 (two) times daily as needed for allergies.     fexofenadine (ALLEGRA) 180 MG tablet Take 180 mg by mouth every morning.     ipratropium-albuterol  (DUONEB) 0.5-2.5 (3) MG/3ML SOLN Take 3 mLs by nebulization.      losartan-hydrochlorothiazide (HYZAAR) 50-12.5 MG per tablet Take 1 tablet by mouth every morning.      Melatonin 10 MG TABS Take 10 mg by mouth at bedtime.     Multiple Vitamin (MULTIVITAMIN) tablet Take 1 tablet by mouth every morning.     pantoprazole  (PROTONIX ) 40 MG tablet TAKE 1 TABLET BY MOUTH TWICE A DAY BEFORE A MEAL-TAKE 30-60 MINUTES BEFORE  YOUR FIRST AND LAST MEALS OF THE DAY 180 tablet 0   potassium chloride (MICRO-K) 10 MEQ CR capsule Take 10 mEq by mouth daily.     Respiratory Therapy Supplies (FLUTTER) DEVI As instructed 1 each 0   Turmeric 500 MG CAPS Take 1 capsule by mouth every morning.      vitamin C (ASCORBIC ACID) 500 MG tablet Take 500 mg by mouth every morning.     No current facility-administered medications on file prior to visit.  [2]  Allergies Allergen Reactions   Sulfonamide Derivatives Nausea And Vomiting   "

## 2024-05-09 ENCOUNTER — Encounter: Payer: Self-pay | Admitting: Neurology

## 2024-05-09 ENCOUNTER — Other Ambulatory Visit

## 2024-05-09 ENCOUNTER — Ambulatory Visit (INDEPENDENT_AMBULATORY_CARE_PROVIDER_SITE_OTHER): Payer: Medicare Other | Admitting: Neurology

## 2024-05-09 VITALS — BP 144/83 | HR 77 | Ht 59.0 in | Wt 151.4 lb

## 2024-05-09 DIAGNOSIS — D333 Benign neoplasm of cranial nerves: Secondary | ICD-10-CM | POA: Diagnosis not present

## 2024-05-09 DIAGNOSIS — G40009 Localization-related (focal) (partial) idiopathic epilepsy and epileptic syndromes with seizures of localized onset, not intractable, without status epilepticus: Secondary | ICD-10-CM | POA: Diagnosis not present

## 2024-05-09 DIAGNOSIS — E559 Vitamin D deficiency, unspecified: Secondary | ICD-10-CM

## 2024-05-09 DIAGNOSIS — Z79899 Other long term (current) drug therapy: Secondary | ICD-10-CM | POA: Diagnosis not present

## 2024-05-09 DIAGNOSIS — M8580 Other specified disorders of bone density and structure, unspecified site: Secondary | ICD-10-CM | POA: Diagnosis not present

## 2024-05-09 MED ORDER — CARBAMAZEPINE ER 200 MG PO CP12: 200.0000 mg | ORAL_CAPSULE | Freq: Two times a day (BID) | ORAL | 4 refills | Status: AC

## 2024-05-09 NOTE — Patient Instructions (Signed)
 It's always a pleasure to see you.  Continue carbamazepine  ER 200mg  twice a day  2. Have bloodwork done for vitamin D  level  3. Follow-up in 1 year, call for any changes   Seizure Precautions: 1. If medication has been prescribed for you to prevent seizures, take it exactly as directed.  Do not stop taking the medicine without talking to your doctor first, even if you have not had a seizure in a long time.   2. Avoid activities in which a seizure would cause danger to yourself or to others.  Don't operate dangerous machinery, swim alone, or climb in high or dangerous places, such as on ladders, roofs, or girders.  Do not drive unless your doctor says you may.  3. If you have any warning that you may have a seizure, lay down in a safe place where you can't hurt yourself.    4.  No driving for 6 months from last seizure, as per [place]  state law.   Please refer to the following link on the [employer]ile[employer]'s website for more information: http://www.epilepsyfoundation.org/answerplace/Social/driving/drivingu.cfm   5.  Maintain good sleep hygiene.  6.  Contact your doctor if you have any problems that may be related to the medicine you are taking.  7.  Call 911 and bring the patient back to the ED if:        A.  The seizure lasts longer than 5 minutes.       B.  The patient doesn't awaken shortly after the seizure  C.  The patient has new problems such as difficulty seeing, speaking or moving  D.  The patient was injured during the seizure  E.  The patient has a temperature over 102 F (39C)  F.  The patient vomited and now is having trouble breathing

## 2024-05-10 LAB — VITAMIN D 25 HYDROXY (VIT D DEFICIENCY, FRACTURES): Vit D, 25-Hydroxy: 55 ng/mL (ref 30–100)

## 2024-05-17 ENCOUNTER — Telehealth: Payer: Self-pay | Admitting: Internal Medicine

## 2024-05-17 NOTE — Telephone Encounter (Signed)
 Copied from CRM [id number]. Topic: Clinical - Medication Refill >> May 17, 2024  2:26 PM Celestine FALCON wrote: Medication: pantoprazole  (PROTONIX ) 40 MG tablet   Has the patient contacted their pharmacy? Yes; pharmacy stated they sent in a fax for a refill, but they did not get a response (it's been roughly a week per the pt). (Agent: If no, request that the patient contact the pharmacy for the refill. If patient does not wish to contact the pharmacy document the reason why and proceed with request.) (Agent: If yes, when and what did the pharmacy advise?)  This is the patient's preferred pharmacy:  [facility] - [place], [place] - [address] [place] [address] Phone: [phone] Fax: [phone]  Is this the correct pharmacy for this prescription? Yes If no, delete pharmacy and type the correct one.   Has the prescription been filled recently? Yes  Is the patient out of the medication? Yes  Has the patient been seen for an appointment in the last year OR does the patient have an upcoming appointment? Yes  Can we respond through MyChart? Yes  Agent: Please be advised that Rx refills may take up to 3 business days. We ask that you follow-up with your pharmacy.

## 2024-05-18 MED ORDER — PANTOPRAZOLE SODIUM 40 MG PO TBEC: 40.0000 mg | DELAYED_RELEASE_TABLET | Freq: Every day | ORAL | 2 refills | Status: AC

## 2024-05-18 NOTE — Telephone Encounter (Signed)
 Please advise on refill Dr. Darlean

## 2024-05-18 NOTE — Telephone Encounter (Signed)
 Refill sent

## 2025-05-09 ENCOUNTER — Ambulatory Visit: Payer: Self-pay | Admitting: Neurology
